# Patient Record
Sex: Male | Born: 1945 | Race: White | Hispanic: No | Marital: Married | Smoking: Former smoker
Health system: Southern US, Community
[De-identification: ages and names within clinical notes are randomized; demographics above are authoritative.]

## PROBLEM LIST (undated history)

## (undated) DIAGNOSIS — J449 Chronic obstructive pulmonary disease, unspecified: Secondary | ICD-10-CM

## (undated) DIAGNOSIS — D649 Anemia, unspecified: Secondary | ICD-10-CM

## (undated) DIAGNOSIS — C801 Malignant (primary) neoplasm, unspecified: Secondary | ICD-10-CM

## (undated) HISTORY — PX: TONSILLECTOMY: SUR1361

---

## 1989-10-02 HISTORY — PX: KNEE ARTHROSCOPY: SUR90

## 2017-03-09 ENCOUNTER — Emergency Department: Payer: PPO

## 2017-03-09 ENCOUNTER — Inpatient Hospital Stay
Admission: EM | Admit: 2017-03-09 | Discharge: 2017-03-11 | DRG: 375 | Disposition: A | Discharge status: Home / Self Care | Payer: PPO | Attending: Internal Medicine | Admitting: Internal Medicine

## 2017-03-09 ENCOUNTER — Encounter: Payer: Self-pay | Admitting: Emergency Medicine

## 2017-03-09 DIAGNOSIS — Z7982 Long term (current) use of aspirin: Secondary | ICD-10-CM | POA: Diagnosis not present

## 2017-03-09 DIAGNOSIS — R030 Elevated blood-pressure reading, without diagnosis of hypertension: Secondary | ICD-10-CM | POA: Diagnosis present

## 2017-03-09 DIAGNOSIS — Z87891 Personal history of nicotine dependence: Secondary | ICD-10-CM | POA: Diagnosis not present

## 2017-03-09 DIAGNOSIS — R1900 Intra-abdominal and pelvic swelling, mass and lump, unspecified site: Secondary | ICD-10-CM | POA: Diagnosis not present

## 2017-03-09 DIAGNOSIS — M129 Arthropathy, unspecified: Secondary | ICD-10-CM | POA: Diagnosis not present

## 2017-03-09 DIAGNOSIS — E871 Hypo-osmolality and hyponatremia: Secondary | ICD-10-CM | POA: Diagnosis not present

## 2017-03-09 DIAGNOSIS — R109 Unspecified abdominal pain: Secondary | ICD-10-CM | POA: Diagnosis not present

## 2017-03-09 DIAGNOSIS — R1013 Epigastric pain: Secondary | ICD-10-CM | POA: Diagnosis present

## 2017-03-09 DIAGNOSIS — R7989 Other specified abnormal findings of blood chemistry: Secondary | ICD-10-CM | POA: Diagnosis present

## 2017-03-09 DIAGNOSIS — C168 Malignant neoplasm of overlapping sites of stomach: Secondary | ICD-10-CM | POA: Diagnosis not present

## 2017-03-09 DIAGNOSIS — J449 Chronic obstructive pulmonary disease, unspecified: Secondary | ICD-10-CM

## 2017-03-09 DIAGNOSIS — D62 Acute posthemorrhagic anemia: Secondary | ICD-10-CM | POA: Diagnosis not present

## 2017-03-09 DIAGNOSIS — G893 Neoplasm related pain (acute) (chronic): Secondary | ICD-10-CM | POA: Diagnosis not present

## 2017-03-09 DIAGNOSIS — C787 Secondary malignant neoplasm of liver and intrahepatic bile duct: Secondary | ICD-10-CM | POA: Diagnosis not present

## 2017-03-09 DIAGNOSIS — R531 Weakness: Secondary | ICD-10-CM

## 2017-03-09 DIAGNOSIS — K3189 Other diseases of stomach and duodenum: Secondary | ICD-10-CM | POA: Diagnosis present

## 2017-03-09 DIAGNOSIS — C160 Malignant neoplasm of cardia: Principal | ICD-10-CM | POA: Diagnosis present

## 2017-03-09 DIAGNOSIS — R110 Nausea: Secondary | ICD-10-CM | POA: Diagnosis not present

## 2017-03-09 DIAGNOSIS — R9431 Abnormal electrocardiogram [ECG] [EKG]: Secondary | ICD-10-CM | POA: Diagnosis not present

## 2017-03-09 DIAGNOSIS — D500 Iron deficiency anemia secondary to blood loss (chronic): Secondary | ICD-10-CM

## 2017-03-09 DIAGNOSIS — M899 Disorder of bone, unspecified: Secondary | ICD-10-CM | POA: Diagnosis not present

## 2017-03-09 DIAGNOSIS — C799 Secondary malignant neoplasm of unspecified site: Secondary | ICD-10-CM

## 2017-03-09 DIAGNOSIS — Z803 Family history of malignant neoplasm of breast: Secondary | ICD-10-CM | POA: Diagnosis not present

## 2017-03-09 DIAGNOSIS — K769 Liver disease, unspecified: Secondary | ICD-10-CM

## 2017-03-09 DIAGNOSIS — C169 Malignant neoplasm of stomach, unspecified: Secondary | ICD-10-CM

## 2017-03-09 DIAGNOSIS — M1990 Unspecified osteoarthritis, unspecified site: Secondary | ICD-10-CM | POA: Diagnosis present

## 2017-03-09 DIAGNOSIS — D600 Chronic acquired pure red cell aplasia: Secondary | ICD-10-CM | POA: Diagnosis not present

## 2017-03-09 DIAGNOSIS — R5383 Other fatigue: Secondary | ICD-10-CM

## 2017-03-09 DIAGNOSIS — R10816 Epigastric abdominal tenderness: Secondary | ICD-10-CM | POA: Diagnosis present

## 2017-03-09 DIAGNOSIS — R634 Abnormal weight loss: Secondary | ICD-10-CM | POA: Diagnosis not present

## 2017-03-09 DIAGNOSIS — R627 Adult failure to thrive: Secondary | ICD-10-CM | POA: Diagnosis not present

## 2017-03-09 DIAGNOSIS — K921 Melena: Secondary | ICD-10-CM | POA: Diagnosis not present

## 2017-03-09 HISTORY — DX: Chronic obstructive pulmonary disease, unspecified: J44.9

## 2017-03-09 LAB — IRON AND TIBC
IRON: 10 ug/dL — AB (ref 45–182)
SATURATION RATIOS: 3 % — AB (ref 17.9–39.5)
TIBC: 297 ug/dL (ref 250–450)
UIBC: 287 ug/dL

## 2017-03-09 LAB — COMPREHENSIVE METABOLIC PANEL
ALBUMIN: 3.8 g/dL (ref 3.5–5.0)
ALT: 16 U/L — ABNORMAL LOW (ref 17–63)
ANION GAP: 9 (ref 5–15)
AST: 22 U/L (ref 15–41)
Alkaline Phosphatase: 98 U/L (ref 38–126)
BILIRUBIN TOTAL: 0.6 mg/dL (ref 0.3–1.2)
BUN: 16 mg/dL (ref 6–20)
CHLORIDE: 95 mmol/L — AB (ref 101–111)
CO2: 27 mmol/L (ref 22–32)
Calcium: 9.2 mg/dL (ref 8.9–10.3)
Creatinine, Ser: 0.57 mg/dL — ABNORMAL LOW (ref 0.61–1.24)
GFR calc Af Amer: >60 mL/min (ref >60)
GFR calc non Af Amer: >60 mL/min (ref >60)
GLUCOSE: 105 mg/dL — AB (ref 65–99)
POTASSIUM: 4 mmol/L (ref 3.5–5.1)
SODIUM: 131 mmol/L — AB (ref 135–145)
TOTAL PROTEIN: 7.3 g/dL (ref 6.5–8.1)

## 2017-03-09 LAB — CBC
HCT: 28 % — ABNORMAL LOW (ref 40.0–52.0)
Hemoglobin: 8.9 g/dL — ABNORMAL LOW (ref 13.0–18.0)
MCH: 23.5 pg — ABNORMAL LOW (ref 26.0–34.0)
MCHC: 31.9 g/dL — AB (ref 32.0–36.0)
MCV: 73.7 fL — ABNORMAL LOW (ref 80.0–100.0)
PLATELETS: 654 10*3/uL — AB (ref 150–440)
RBC: 3.8 MIL/uL — ABNORMAL LOW (ref 4.40–5.90)
RDW: 24.2 % — AB (ref 11.5–14.5)
WBC: 12.1 10*3/uL — ABNORMAL HIGH (ref 3.8–10.6)

## 2017-03-09 LAB — LIPASE, BLOOD: Lipase: 26 U/L (ref 11–51)

## 2017-03-09 LAB — PROTIME-INR
INR: 1.08
Prothrombin Time: 14 seconds (ref 11.4–15.2)

## 2017-03-09 LAB — FERRITIN: FERRITIN: 60 ng/mL (ref 24–336)

## 2017-03-09 LAB — LACTATE DEHYDROGENASE: LDH: 213 U/L — AB (ref 98–192)

## 2017-03-09 LAB — TROPONIN I: Troponin I: <0.03 ng/mL (ref <0.03)

## 2017-03-09 LAB — FOLATE: Folate: 17 ng/mL (ref >5.9)

## 2017-03-09 LAB — VITAMIN B12: Vitamin B-12: 327 pg/mL (ref 180–914)

## 2017-03-09 MED ORDER — PANTOPRAZOLE SODIUM 40 MG PO TBEC
40.0000 mg | DELAYED_RELEASE_TABLET | Freq: Every day | ORAL | Status: DC
  Administered 2017-03-09: 16:00:00 40 mg via ORAL
  Filled 2017-03-09: qty 1

## 2017-03-09 MED ORDER — METOPROLOL TARTRATE 25 MG PO TABS
12.5000 mg | ORAL_TABLET | Freq: Two times a day (BID) | ORAL | Status: DC
  Administered 2017-03-09 – 2017-03-10 (×3): 12.5 mg via ORAL
  Filled 2017-03-09 (×3): qty 1

## 2017-03-09 MED ORDER — IOPAMIDOL (ISOVUE-300) INJECTION 61%
100.0000 mL | Freq: Once | INTRAVENOUS | Status: AC | PRN
  Administered 2017-03-09: 100 mL via INTRAVENOUS

## 2017-03-09 MED ORDER — TRAMADOL HCL 50 MG PO TABS
50.0000 mg | ORAL_TABLET | Freq: Four times a day (QID) | ORAL | Status: DC | PRN
  Administered 2017-03-09: 50 mg via ORAL
  Filled 2017-03-09: qty 1

## 2017-03-09 MED ORDER — IRON SUCROSE 20 MG/ML IV SOLN
100.0000 mg | Freq: Once | INTRAVENOUS | Status: AC
  Administered 2017-03-10: 100 mg via INTRAVENOUS
  Filled 2017-03-09: qty 5

## 2017-03-09 MED ORDER — SODIUM CHLORIDE 0.9 % IV SOLN
INTRAVENOUS | Status: AC
  Administered 2017-03-09: 16:00:00 via INTRAVENOUS

## 2017-03-09 MED ORDER — FENTANYL CITRATE (PF) 100 MCG/2ML IJ SOLN
25.0000 ug | Freq: Once | INTRAMUSCULAR | Status: AC
  Administered 2017-03-09: 25 ug via INTRAVENOUS
  Filled 2017-03-09: qty 2

## 2017-03-09 MED ORDER — FENTANYL CITRATE (PF) 100 MCG/2ML IJ SOLN: 12.5000 ug | INTRAMUSCULAR | Status: DC | PRN

## 2017-03-09 MED ORDER — ONDANSETRON HCL 4 MG PO TABS
4.0000 mg | ORAL_TABLET | Freq: Four times a day (QID) | ORAL | Status: DC | PRN
  Administered 2017-03-10 – 2017-03-11 (×2): 4 mg via ORAL
  Filled 2017-03-09 (×2): qty 1

## 2017-03-09 MED ORDER — ONDANSETRON HCL 4 MG/2ML IJ SOLN
4.0000 mg | Freq: Four times a day (QID) | INTRAMUSCULAR | Status: DC | PRN
  Administered 2017-03-09 – 2017-03-10 (×2): 4 mg via INTRAVENOUS
  Filled 2017-03-09 (×2): qty 2

## 2017-03-09 MED ORDER — ONDANSETRON HCL 4 MG/2ML IJ SOLN
4.0000 mg | Freq: Once | INTRAMUSCULAR | Status: AC
  Administered 2017-03-09: 4 mg via INTRAVENOUS
  Filled 2017-03-09: qty 2

## 2017-03-09 MED ORDER — ORAL CARE MOUTH RINSE
15.0000 mL | Freq: Two times a day (BID) | OROMUCOSAL | Status: DC
  Administered 2017-03-09 – 2017-03-11 (×4): 15 mL via OROMUCOSAL

## 2017-03-09 MED ORDER — ENOXAPARIN SODIUM 40 MG/0.4ML ~~LOC~~ SOLN: 40.0000 mg | SUBCUTANEOUS | Status: DC

## 2017-03-09 MED ORDER — IOPAMIDOL (ISOVUE-300) INJECTION 61%
30.0000 mL | Freq: Once | INTRAVENOUS | Status: AC | PRN
  Administered 2017-03-09: 30 mL via ORAL

## 2017-03-09 MED ORDER — ENOXAPARIN SODIUM 40 MG/0.4ML ~~LOC~~ SOLN
40.0000 mg | SUBCUTANEOUS | Status: DC
  Filled 2017-03-09: qty 0.4

## 2017-03-09 NOTE — Progress Notes (Signed)
Family Meeting Note  Advance Directive:yes  Today a meeting took place with the Patient, SPOUSE   The following clinical team members were present during this meeting:MD  The following were discussed:Patient's diagnosis: Management and plan of care discussed in detail with the patient and spouse at bedside, Patient's progosis: Unable to determine and Goals for treatment: Full Code, WIFE is HCPOA, not considering palliative care at this time  Additional follow-up to be provided: Hospitalist and oncology  Time spent during discussion:16 min  Larry Wood, Illene Silver, MD

## 2017-03-09 NOTE — ED Triage Notes (Signed)
C/O abdominal pain x 10 days.  Denies n/v

## 2017-03-09 NOTE — H&P (Signed)
[place] [facility] [place] NAME: Larry Wood    MR#:  [id number]  DATE OF BIRTH:  1945/12/10  DATE OF ADMISSION:  03/09/2017  PRIMARY CARE PHYSICIAN: Patient, No Pcp Per   REQUESTING/REFERRING PHYSICIAN: [facility]  CHIEF COMPLAINT:  Epigastric abdominal pain and weight loss  HISTORY OF PRESENT ILLNESS:  Larry Wood  is a 71 y.o. male with a known history of COPD, remote history of smoking and alcoholism is presenting to the ED with a chief complaint of epigastric abdominal pain and weight loss of approximately 10 pounds in the past one month. Patient is also reporting decreased appetite. Sodium is at 131 and CT abdomen has revealed widespread hepatic metastatic disease. Gastric cardia mass and gastrohepatic ligament adenopathy implying gastric primary  PAST MEDICAL HISTORY:   Past Medical History:  Diagnosis Date  . COPD (chronic obstructive pulmonary disease) ([place])     PAST SURGICAL HISTOIRY:  History reviewed. No pertinent surgical history.  SOCIAL HISTORY:   Social History  Substance Use Topics  . Smoking status: Former Research scientist (life sciences)  . Smokeless tobacco: Never Used  . Alcohol use Not on file    FAMILY HISTORY:   Family history reviewed, no significant family history DRUG ALLERGIES:  No Known Allergies  REVIEW OF SYSTEMS:  CONSTITUTIONAL: No fever, fatigue or weakness.  EYES: No blurred or double vision.  EARS, NOSE, AND THROAT: No tinnitus or ear pain.  RESPIRATORY: No cough, shortness of breath, wheezing or hemoptysis.  CARDIOVASCULAR: No chest pain, orthopnea, edema.  GASTROINTESTINAL: Reporting  intractable epigastric abdominal pain and weight loss  GENITOURINARY: No dysuria, hematuria.  ENDOCRINE: No polyuria, nocturia,  HEMATOLOGY: No anemia, easy bruising or bleeding SKIN: No rash or lesion. MUSCULOSKELETAL: No joint pain or arthritis.   NEUROLOGIC: No tingling, numbness, weakness.  PSYCHIATRY: No anxiety or  depression.   MEDICATIONS AT HOME:   Prior to Admission medications   Medication Sig Start Date End Date Taking? Authorizing Provider  aspirin EC 81 MG tablet Take 81 mg by mouth daily.   Yes [provider]      VITAL SIGNS:  Blood pressure (!) 160/81, pulse 88, temperature 97.8 F (36.6 C), temperature source Oral, resp. rate (!) 21, height 5\' 10"  (1.778 m), weight 54.4 kg (120 lb), SpO2 99 %.  PHYSICAL EXAMINATION:  GENERAL:  71 y.o.-year-old patient lying in the bed with no acute distress. Cachectic EYES: Pupils equal, round, reactive to light and accommodation. No scleral icterus. Extraocular muscles intact.  HEENT: Head atraumatic, normocephalic. Oropharynx and nasopharynx clear.  NECK:  Supple, no jugular venous distention. No thyroid enlargement, no tenderness.  LUNGS: Normal breath sounds bilaterally, no wheezing, rales,rhonchi or crepitation. No use of accessory muscles of respiration.  CARDIOVASCULAR: S1, S2 normal. No murmurs, rubs, or gallops.  ABDOMEN: Soft, epigastric abdominal tenderness but no rebound tenderness nondistended. Bowel sounds present.   EXTREMITIES: No pedal edema, cyanosis, or clubbing.  NEUROLOGIC: Cranial nerves II through XII are intact. Muscle strength 5/5 in all extremities. Sensation intact. Gait not checked.  PSYCHIATRIC: The patient is alert and oriented x 3.  SKIN: No obvious rash, lesion, or ulcer.   LABORATORY PANEL:   CBC  Recent Labs Lab 03/09/17 1056  WBC 12.1*  HGB 8.9*  HCT 28.0*  PLT 654*   ------------------------------------------------------------------------------------------------------------------  Chemistries   Recent Labs Lab 03/09/17 1056  NA 131*  K 4.0  CL 95*  CO2 27  GLUCOSE 105*  BUN 16  CREATININE 0.57*  CALCIUM 9.2  AST 22  ALT 16*  ALKPHOS 98  BILITOT 0.6   ------------------------------------------------------------------------------------------------------------------  Cardiac  Enzymes  Recent Labs Lab 03/09/17 1056  TROPONINI <0.03   ------------------------------------------------------------------------------------------------------------------  RADIOLOGY:  Dg Chest 2 View  Result Date: 03/09/2017 CLINICAL DATA:  Nausea and epigastric pain over the last 10 days. EXAM: CHEST  2 VIEW COMPARISON:  None. FINDINGS: Heart size is normal. There is aortic atherosclerosis. Lungs are hyperinflated with likely emphysema. The lungs do not show any focal infiltrate, mass or volume loss. Snap artifacts overlie the chest. No effusions. No significant bone finding. IMPRESSION: No active disease.  Aortic atherosclerosis.  Probable emphysema. Electronically Signed   By: Nelson Chimes M.D.   On: 03/09/2017 11:27   Ct Abdomen Pelvis W Contrast  Result Date: 03/09/2017 CLINICAL DATA:  Epigastric pain with significant weight loss. EXAM: CT ABDOMEN AND PELVIS WITH CONTRAST TECHNIQUE: Multidetector CT imaging of the abdomen and pelvis was performed using the standard protocol following bolus administration of intravenous contrast. CONTRAST:  165mL ISOVUE-300 IOPAMIDOL (ISOVUE-300) INJECTION 61% COMPARISON:  None. FINDINGS: Lower chest:  Calcified granuloma in the left lower lobe. Hepatobiliary: Numerous hepatic masses which appears solid in heterogeneously enhancing, largest measuring 5 cm in segment 6. No background cirrhotic features.No evidence of biliary obstruction or stone. Pancreas: Negative for mass or ductal obstruction. Spleen: Unremarkable. Adrenals/Urinary Tract: Negative adrenals. No hydronephrosis or stone. Small renal cysts. Unremarkable bladder. Stomach/Bowel: There is a mass at the gastric cardia projecting into the lumen, measuring at least 33 mm. Two enlarged nodes in the gastrohepatic ligament with appearance matching the liver lesions, 18 mm in maximal diameter. No bowel obstruction.  Extensive colonic diverticulosis. Vascular/Lymphatic: Severe atherosclerosis with  short-segment left common iliac artery occlusion. Gastrohepatic ligament adenopathy as above. Reproductive:No pathologic findings. Other: No ascites or pneumoperitoneum. Musculoskeletal: No acute abnormalities. 1 mm sclerotic focus in the L2 vertebral body. IMPRESSION: 1. Findings of widespread hepatic metastatic disease. Gastric cardia mass and gastrohepatic ligament adenopathy implying gastric primary. 2. Attention on follow up to 1 cm sclerotic focus in L2 body. 3. Advanced atherosclerosis. Short-segment left common iliac occlusion. Electronically Signed   By: Monte Fantasia M.D.   On: 03/09/2017 12:45    EKG:   Orders placed or performed during the hospital encounter of 03/09/17  . ED EKG  . ED EKG  . EKG 12-Lead  . EKG 12-Lead    IMPRESSION AND PLAN:  Pt is presenting with a chief complaint of epigastric abdominal pain and weight loss of approximately 10 pounds in the past one month. Patient is also reporting decreased appetite. Sodium is at 131 and CT abdomen has revealed widespread hepatic metastatic disease. Gastric cardia mass and gastrohepatic ligament adenopathy implying gastric primary   # Hyponatremia secondary to poor by mouth intake Admit to MedSurg unit Hydrated with IV fluids Monitor BMPs Check fasting lipid panel and TSH  #Acute epigastric abdominal pain secondary to new diagnosis of gastric cancer with liver metastases Pain management as needed Oncology consulted, Dr. Janese Banks, holding patient's aspirin for possible biopsies PPI  #Failure to thrive secondary to a primary gastric cancer Consult dietitian for calorie count and that his supplements  #Elevated blood pressure without diagnosis of hypertension could be from anxiety and stress Will monitor blood pressure closely Start patient on low-dose metoprolol, titrate to discontinue as needed  Provide GI and DVT prophylaxis  All the records are reviewed and case discussed with ED provider. Management plans discussed  with the patient, family and  they are in agreement.  CODE STATUS: fc,Wife is the healthcare power of attorney  TOTAL TIME TAKING CARE OF THIS PATIENT: 45 minutes.   Note: This dictation was prepared with Dragon dictation along with smaller phrase technology. Any transcriptional errors that result from this process are unintentional.  Nicholes Mango M.D on 03/09/2017 at 2:18 PM  Between 7am to 6pm - Pager - [phone]  After 6pm go to [email or web address] - password [email or web address] [facility]  Office  [phone]  CC: Primary care physician; Patient, No Pcp Per

## 2017-03-09 NOTE — ED Provider Notes (Signed)
[facility] Emergency Department Provider Note   ____________________________________________    I have reviewed the triage vital signs and the nursing notes.   HISTORY  Chief Complaint Abdominal Pain     HPI Larry Wood is a 71 y.o. male who presents with complaints of abdominal pain. Patient reports epigastric abdominal pain which is been continuous over the last 10 days. He reports the pain is severe and throbbing and worse with lying flat. He has never had this before. No history of abdominal surgeries. Nothing seems to make it better or worse. No fevers or chills. He does report some shortness of breath has a history of COPD. He denies chest pain. No back pain. Normal stools, no nausea or vomiting   Past Medical History:  Diagnosis Date  . COPD (chronic obstructive pulmonary disease) ([facility])     There are no active problems to display for this patient.   History reviewed. No pertinent surgical history.  Prior to Admission medications   Medication Sig Start Date End Date Taking? Authorizing Provider  aspirin EC 81 MG tablet Take 81 mg by mouth daily.   Yes [provider]     Allergies Patient has no known allergies.  No family history on file.  Social History Social History  Substance Use Topics  . Smoking status: Former Research scientist (life sciences)  . Smokeless tobacco: Never Used  . Alcohol use Not on file    Review of Systems  Constitutional:Positive weight loss Eyes: No visual changes.  ENT: No sore throat. Cardiovascular: Denies chest pain. Respiratory: As above Gastrointestinal:As above Genitourinary: Negative for dysuria. Musculoskeletal: Negative for back pain. Skin: Negative for rash. Neurological: Negative for headaches or weakness   ____________________________________________   PHYSICAL EXAM:  VITAL SIGNS: ED Triage Vitals  Enc Vitals Group     BP 03/09/17 1030 131/61     Pulse Rate 03/09/17 1030 88   Resp 03/09/17 1030 16     Temp 03/09/17 1030 97.8 F (36.6 C)     Temp Source 03/09/17 1030 Oral     SpO2 03/09/17 1030 100 %     Weight 03/09/17 1031 54.4 kg (120 lb)     Height 03/09/17 1031 1.778 m (5\' 10" )     Head Circumference --      Peak Flow --      Pain Score 03/09/17 1031 8     Pain Loc --      Pain Edu? --      Excl. in [place]? --    Constitutional: Alert and oriented.Ill-appearing Eyes: Conjunctivae are normal.  Head: Atraumatic. Nose: No congestion/rhinnorhea. Mouth/Throat: Mucous membranes are moist.   Cardiovascular: Normal rate, regular rhythm. Grossly normal heart sounds.  Good peripheral circulation. Respiratory: Normal respiratory effort.  No retractions. Lungs CTAB. Gastrointestinal: Tenderness to palpation of the epigastrium. No distention.  No CVA tenderness. Genitourinary: deferred Musculoskeletal: .  Warm and well perfused Neurologic:  Normal speech and language. No gross focal neurologic deficits are appreciated.  Skin:  Skin is warm, dry and intact. No rash noted. Psychiatric: Mood and affect are normal. Speech and behavior are normal.  ____________________________________________   LABS (all labs ordered are listed, but only abnormal results are displayed)  Labs Reviewed  CBC - Abnormal; Notable for the following:       Result Value   WBC 12.1 (*)    RBC 3.80 (*)    Hemoglobin 8.9 (*)    HCT 28.0 (*)    MCV 73.7 (*)  MCH 23.5 (*)    MCHC 31.9 (*)    RDW 24.2 (*)    Platelets 654 (*)    All other components within normal limits  COMPREHENSIVE METABOLIC PANEL - Abnormal; Notable for the following:    Sodium 131 (*)    Chloride 95 (*)    Glucose, Bld 105 (*)    Creatinine, Ser 0.57 (*)    ALT 16 (*)    All other components within normal limits  LIPASE, BLOOD  TROPONIN I   ____________________________________________  EKG  ED ECG REPORT I, Lavonia Drafts, the attending physician, personally viewed and interpreted this ECG.  Date:  03/09/2017  Rate: 87 Rhythm: normal sinus rhythm QRS Axis: normal Intervals: normal ST/T Wave abnormalities: Nonspecific changes Conduction Disturbances: PVCs   ____________________________________________  RADIOLOGY  CT demonstrates findings of widespread hepatic metastatic disease likely gastric primary ____________________________________________   PROCEDURES  Procedure(s) performed: No    Critical Care performed: No ____________________________________________   INITIAL IMPRESSION / ASSESSMENT AND PLAN / ED COURSE  Pertinent labs & imaging results that were available during my care of the patient were reviewed by me and considered in my medical decision making (see chart for details).  Patient presents with epigastric pain with significant weight loss. Does not appear consistent with biliary colic, concern for pancreatic or gastric origin, possibly CA. We will give IV fentanyl, IV Zofran obtain CT abdomen and pelvis and reevaluate   ----------------------------------------- 1:14 PM on 03/09/2017 -----------------------------------------  CT demonstrates likely metastatic disease with gastric source. I discussed this with the patient. I also spoke to Dr. Janese Banks of oncology who will see the patient. The patient continues to have pain, additional dose of IV fentanyl given    ____________________________________________   FINAL CLINICAL IMPRESSION(S) / ED DIAGNOSES  Final diagnoses:  Metastatic neoplastic disease ([place])      NEW MEDICATIONS STARTED DURING THIS VISIT:  New Prescriptions   No medications on file     Note:  This document was prepared using Dragon voice recognition software and may include unintentional dictation errors.    Lavonia Drafts, MD 03/09/17 1316

## 2017-03-09 NOTE — ED Notes (Signed)
Pt returned from CT °

## 2017-03-09 NOTE — Plan of Care (Signed)
Problem: Pain Managment: Goal: General experience of comfort will improve Outcome: Progressing Pt admitted from the ED. VSS. Tramadol given for abd pain once with improvement. Denies n/v.

## 2017-03-09 NOTE — Consult Note (Signed)
Hematology/Oncology Consult note [facility] Telephone:([phone] [phone]  Patient Care Team: Patient, No Pcp Per as PCP - General (General Practice)   Name of the patient: Larry Wood  [id number]  1945/10/12    Reason for consult: gastric mass/ liver mets   Requesting physician: Dr. Corky Downs  Date of visit: 03/09/2017    History of presenting illness- patient is a 71 year old Caucasian male who presented to the ER today with symptoms of worsening abdominal pain. Pain started about 1 week ago and is in the epigastrium. Prior to 1 week he reports no pain. He has been havving dark tarry stools for about 3 weeks now.  He is also had a significant weight loss of about 10 pounds over the last few months. CT abdomen done in the ER showed multiple liver metastases as well as a mass in the gastric cardia projecting into the lumen as well as adenopathy in the gastrohepatic ligament   At baseline he has arthritis but no other medical issues. He lives with his wife in [place]. His daughter is deceased and son lives close to [place]. Sister probably had breast cancer. He is not sure.   ECOG PS- 2  Pain scale- 4   Review of systems- Review of Systems  Constitutional: Positive for malaise/fatigue and weight loss. Negative for chills and fever.  HENT: Negative for congestion, ear discharge and nosebleeds.   Eyes: Negative for blurred vision.  Respiratory: Negative for cough, hemoptysis, sputum production, shortness of breath and wheezing.   Cardiovascular: Negative for chest pain, palpitations, orthopnea and claudication.  Gastrointestinal: Positive for abdominal pain and melena. Negative for blood in stool, constipation, diarrhea, heartburn, nausea and vomiting.  Genitourinary: Negative for dysuria, flank pain, frequency, hematuria and urgency.  Musculoskeletal: Negative for back pain, joint pain and myalgias.  Skin: Negative for rash.  Neurological:  Positive for weakness. Negative for dizziness, tingling, focal weakness, seizures and headaches.  Endo/Heme/Allergies: Does not bruise/bleed easily.  Psychiatric/Behavioral: Negative for depression and suicidal ideas. The patient does not have insomnia.     No Known Allergies  There are no active problems to display for this patient.    Past Medical History:  Diagnosis Date  . COPD (chronic obstructive pulmonary disease) ([place])      History reviewed. No pertinent surgical history.  Social History   Social History  . Marital status: Married    Spouse name: N/A  . Number of children: N/A  . Years of education: N/A   Occupational History  . Not on file.   Social History Main Topics  . Smoking status: Former Research scientist (life sciences)  . Smokeless tobacco: Never Used  . Alcohol use Not on file  . Drug use: Unknown  . Sexual activity: Not on file   Other Topics Concern  . Not on file   Social History Narrative  . No narrative on file     No family history on file.  No current facility-administered medications for this encounter.  No current outpatient prescriptions on file.   Physical exam:  Vitals:   03/09/17 1030 03/09/17 1031  BP: 131/61   Pulse: 88   Resp: 16   Temp: 97.8 F (36.6 C)   TempSrc: Oral   SpO2: 100%   Weight:  120 lb (54.4 kg)  Height:  5\' 10"  (1.778 m)   Physical Exam  Constitutional: He is oriented to person, place, and time.  Thin and cachectic. Fatigued and ill appearing  HENT:  Head: Normocephalic and  atraumatic.  Eyes: EOM are normal. Pupils are equal, round, and reactive to light.  Neck: Normal range of motion.  Cardiovascular: Normal rate, regular rhythm and normal heart sounds.   Pulmonary/Chest: Effort normal and breath sounds normal.  Abdominal: Soft. Bowel sounds are normal.  TTP in the epigastrium  Neurological: He is alert and oriented to person, place, and time.  Skin: Skin is warm and dry.       CMP Latest Ref Rng & Units 03/09/2017   Glucose 65 - 99 mg/dL 105(H)  BUN 6 - 20 mg/dL 16  Creatinine 0.61 - 1.24 mg/dL 0.57(L)  Sodium 135 - 145 mmol/L 131(L)  Potassium 3.5 - 5.1 mmol/L 4.0  Chloride 101 - 111 mmol/L 95(L)  CO2 22 - 32 mmol/L 27  Calcium 8.9 - 10.3 mg/dL 9.2  Total Protein 6.5 - 8.1 g/dL 7.3  Total Bilirubin 0.3 - 1.2 mg/dL 0.6  Alkaline Phos 38 - 126 U/L 98  AST 15 - 41 U/L 22  ALT 17 - 63 U/L 16(L)   CBC Latest Ref Rng & Units 03/09/2017  WBC 3.8 - 10.6 K/uL 12.1(H)  Hemoglobin 13.0 - 18.0 g/dL 8.9(L)  Hematocrit 40.0 - 52.0 % 28.0(L)  Platelets 150 - 440 K/uL 654(H)    @IMAGES @  Dg Chest 2 View  Result Date: 03/09/2017 CLINICAL DATA:  Nausea and epigastric pain over the last 10 days. EXAM: CHEST  2 VIEW COMPARISON:  None. FINDINGS: Heart size is normal. There is aortic atherosclerosis. Lungs are hyperinflated with likely emphysema. The lungs do not show any focal infiltrate, mass or volume loss. Snap artifacts overlie the chest. No effusions. No significant bone finding. IMPRESSION: No active disease.  Aortic atherosclerosis.  Probable emphysema. Electronically Signed   By: Nelson Chimes M.D.   On: 03/09/2017 11:27   Ct Abdomen Pelvis W Contrast  Result Date: 03/09/2017 CLINICAL DATA:  Epigastric pain with significant weight loss. EXAM: CT ABDOMEN AND PELVIS WITH CONTRAST TECHNIQUE: Multidetector CT imaging of the abdomen and pelvis was performed using the standard protocol following bolus administration of intravenous contrast. CONTRAST:  14mL ISOVUE-300 IOPAMIDOL (ISOVUE-300) INJECTION 61% COMPARISON:  None. FINDINGS: Lower chest:  Calcified granuloma in the left lower lobe. Hepatobiliary: Numerous hepatic masses which appears solid in heterogeneously enhancing, largest measuring 5 cm in segment 6. No background cirrhotic features.No evidence of biliary obstruction or stone. Pancreas: Negative for mass or ductal obstruction. Spleen: Unremarkable. Adrenals/Urinary Tract: Negative adrenals. No  hydronephrosis or stone. Small renal cysts. Unremarkable bladder. Stomach/Bowel: There is a mass at the gastric cardia projecting into the lumen, measuring at least 33 mm. Two enlarged nodes in the gastrohepatic ligament with appearance matching the liver lesions, 18 mm in maximal diameter. No bowel obstruction.  Extensive colonic diverticulosis. Vascular/Lymphatic: Severe atherosclerosis with short-segment left common iliac artery occlusion. Gastrohepatic ligament adenopathy as above. Reproductive:No pathologic findings. Other: No ascites or pneumoperitoneum. Musculoskeletal: No acute abnormalities. 1 mm sclerotic focus in the L2 vertebral body. IMPRESSION: 1. Findings of widespread hepatic metastatic disease. Gastric cardia mass and gastrohepatic ligament adenopathy implying gastric primary. 2. Attention on follow up to 1 cm sclerotic focus in L2 body. 3. Advanced atherosclerosis. Short-segment left common iliac occlusion. Electronically Signed   By: Monte Fantasia M.D.   On: 03/09/2017 12:45    Assessment and plan- Patient is a 71 y.o. male presenting with abdominal pain found to have liver mets and gastric mass as well as gastrohepatic adenopathy concerning for metastatic gastric cancer  1. Please obtain  CT chest with oral and IV contrast and bone scan to complete staging work up. Please keep him NPO after midnight today for possible EGD by GI tomorrow so he can get tissue diagnosis asap. Please start him on protonix given his melena.   2. He does have microcytic anemia on labs likely due to iron deficiency in the setting of bleeding from gastric cancer. I would  recommend checking iron studies and give him 1 dose of IV iron while inpatient. Leucocytosis and thrombocytosis likely reactive to malignancy and iron deficiency  3. Neoplasm related pain- recommend starting oxycodone 5 mg PO Q4 prn for pain. Short course of steroids would also be helpful given widespread liver mets if narcotics dont control  his pain.  I will see him in my clinic 1 week post discharge and discuss treatment options. I explained the results of CT scan to the patient, likely diagnosis of gastric cancer and that chemotherapy would be palliative and nto curative. He would like to think about his options before deciding to opt for treatment.   Thank you for this kind referral and the opportunity to participate in the care of this patient   Visit Diagnosis Gastric mass/ liver metastases  Dr. Randa Evens, MD, MPH [facility] Pager- [phone] 03/09/2017  4:24 PM

## 2017-03-10 ENCOUNTER — Inpatient Hospital Stay: Payer: PPO | Admitting: Anesthesiology

## 2017-03-10 ENCOUNTER — Encounter: Payer: Self-pay | Admitting: Radiology

## 2017-03-10 ENCOUNTER — Encounter
Admission: EM | Disposition: A | Discharge status: Home / Self Care | Payer: Self-pay | Source: Home / Self Care | Attending: Internal Medicine

## 2017-03-10 ENCOUNTER — Inpatient Hospital Stay: Payer: PPO

## 2017-03-10 ENCOUNTER — Other Ambulatory Visit: Payer: PPO

## 2017-03-10 DIAGNOSIS — R10816 Epigastric abdominal tenderness: Secondary | ICD-10-CM | POA: Diagnosis present

## 2017-03-10 DIAGNOSIS — K3189 Other diseases of stomach and duodenum: Secondary | ICD-10-CM | POA: Diagnosis present

## 2017-03-10 HISTORY — PX: ESOPHAGOGASTRODUODENOSCOPY (EGD) WITH PROPOFOL: SHX5813

## 2017-03-10 LAB — COMPREHENSIVE METABOLIC PANEL
ALT: 13 U/L — AB (ref 17–63)
AST: 15 U/L (ref 15–41)
Albumin: 3.1 g/dL — ABNORMAL LOW (ref 3.5–5.0)
Alkaline Phosphatase: 80 U/L (ref 38–126)
Anion gap: 5 (ref 5–15)
BILIRUBIN TOTAL: 0.7 mg/dL (ref 0.3–1.2)
BUN: 13 mg/dL (ref 6–20)
CHLORIDE: 99 mmol/L — AB (ref 101–111)
CO2: 29 mmol/L (ref 22–32)
CREATININE: 0.62 mg/dL (ref 0.61–1.24)
Calcium: 8.7 mg/dL — ABNORMAL LOW (ref 8.9–10.3)
GFR calc non Af Amer: >60 mL/min (ref >60)
Glucose, Bld: 95 mg/dL (ref 65–99)
Potassium: 4.6 mmol/L (ref 3.5–5.1)
Sodium: 133 mmol/L — ABNORMAL LOW (ref 135–145)
TOTAL PROTEIN: 6 g/dL — AB (ref 6.5–8.1)

## 2017-03-10 LAB — CBC
HCT: 24.6 % — ABNORMAL LOW (ref 40.0–52.0)
Hemoglobin: 8.1 g/dL — ABNORMAL LOW (ref 13.0–18.0)
MCH: 24.4 pg — ABNORMAL LOW (ref 26.0–34.0)
MCHC: 32.8 g/dL (ref 32.0–36.0)
MCV: 74.3 fL — AB (ref 80.0–100.0)
PLATELETS: 559 10*3/uL — AB (ref 150–440)
RBC: 3.31 MIL/uL — AB (ref 4.40–5.90)
RDW: 23.8 % — ABNORMAL HIGH (ref 11.5–14.5)
WBC: 8.7 10*3/uL (ref 3.8–10.6)

## 2017-03-10 LAB — OCCULT BLOOD X 1 CARD TO LAB, STOOL
Fecal Occult Bld: NEGATIVE
Fecal Occult Bld: POSITIVE — AB

## 2017-03-10 LAB — TSH: TSH: 2.157 u[IU]/mL (ref 0.350–4.500)

## 2017-03-10 LAB — HAPTOGLOBIN: HAPTOGLOBIN: 255 mg/dL — AB (ref 34–200)

## 2017-03-10 SURGERY — ESOPHAGOGASTRODUODENOSCOPY (EGD) WITH PROPOFOL
Anesthesia: General

## 2017-03-10 MED ORDER — SODIUM CHLORIDE 0.9 % IV SOLN
INTRAVENOUS | Status: DC
  Administered 2017-03-10: 09:00:00 via INTRAVENOUS

## 2017-03-10 MED ORDER — OXYCODONE HCL 5 MG PO TABS
5.0000 mg | ORAL_TABLET | ORAL | Status: DC | PRN
  Administered 2017-03-10 – 2017-03-11 (×2): 5 mg via ORAL
  Filled 2017-03-10 (×2): qty 1

## 2017-03-10 MED ORDER — LIDOCAINE HCL (CARDIAC) 20 MG/ML IV SOLN
INTRAVENOUS | Status: DC | PRN
  Administered 2017-03-10: 40 mg via INTRAVENOUS

## 2017-03-10 MED ORDER — LIDOCAINE HCL (PF) 2 % IJ SOLN
INTRAMUSCULAR | Status: AC
  Filled 2017-03-10: qty 2

## 2017-03-10 MED ORDER — PANTOPRAZOLE SODIUM 40 MG IV SOLR: 40.0000 mg | Freq: Two times a day (BID) | INTRAVENOUS | Status: DC

## 2017-03-10 MED ORDER — IOPAMIDOL (ISOVUE-300) INJECTION 61%
75.0000 mL | Freq: Once | INTRAVENOUS | Status: AC | PRN
  Administered 2017-03-10: 16:00:00 75 mL via INTRAVENOUS

## 2017-03-10 MED ORDER — SODIUM CHLORIDE 0.9 % IV SOLN
INTRAVENOUS | Status: DC
  Administered 2017-03-10: 10:00:00 via INTRAVENOUS

## 2017-03-10 MED ORDER — PROPOFOL 10 MG/ML IV BOLUS
INTRAVENOUS | Status: DC | PRN
  Administered 2017-03-10: 60 mg via INTRAVENOUS

## 2017-03-10 MED ORDER — SODIUM CHLORIDE 0.9% FLUSH: 3.0000 mL | INTRAVENOUS | Status: DC | PRN

## 2017-03-10 MED ORDER — TECHNETIUM TC 99M MEDRONATE IV KIT
22.4000 | PACK | Freq: Once | INTRAVENOUS | Status: AC | PRN
  Administered 2017-03-10: 13:00:00 22.4 via INTRAVENOUS

## 2017-03-10 MED ORDER — MORPHINE SULFATE (PF) 2 MG/ML IV SOLN: 2.0000 mg | INTRAVENOUS | Status: DC | PRN

## 2017-03-10 MED ORDER — SODIUM CHLORIDE 0.9% FLUSH
3.0000 mL | Freq: Two times a day (BID) | INTRAVENOUS | Status: DC
  Administered 2017-03-10 (×2): 3 mL via INTRAVENOUS

## 2017-03-10 MED ORDER — PROPOFOL 500 MG/50ML IV EMUL
INTRAVENOUS | Status: DC | PRN
  Administered 2017-03-10: 150 ug/kg/min via INTRAVENOUS

## 2017-03-10 MED ORDER — SODIUM CHLORIDE 0.9 % IV SOLN
80.0000 mg | Freq: Once | INTRAVENOUS | Status: AC
  Administered 2017-03-10: 80 mg via INTRAVENOUS
  Filled 2017-03-10: qty 80

## 2017-03-10 MED ORDER — SODIUM CHLORIDE 0.9 % IV SOLN
8.0000 mg/h | INTRAVENOUS | Status: DC
  Administered 2017-03-10 – 2017-03-11 (×3): 8 mg/h via INTRAVENOUS
  Filled 2017-03-10 (×3): qty 80

## 2017-03-10 MED ORDER — PROPOFOL 10 MG/ML IV BOLUS
INTRAVENOUS | Status: AC
  Filled 2017-03-10: qty 20

## 2017-03-10 NOTE — Anesthesia Preprocedure Evaluation (Signed)
Anesthesia Evaluation  Patient identified by MRN, date of birth, ID band Patient awake    Reviewed: Allergy & Precautions, NPO status , Patient's Chart, lab work & pertinent test results  History of Anesthesia Complications Negative for: history of anesthetic complications  Airway Mallampati: II  TM Distance: >3 FB Neck ROM: Full    Dental  (+) Edentulous Upper, Edentulous Lower   Pulmonary COPD, former smoker,    breath sounds clear to auscultation- rhonchi (-) wheezing      Cardiovascular (-) hypertension(-) CAD and (-) Past MI  Rhythm:Regular Rate:Normal - Systolic murmurs and - Diastolic murmurs    Neuro/Psych negative neurological ROS  negative psych ROS   GI/Hepatic Neg liver ROS, Gastric mass with liver mets   Endo/Other  negative endocrine ROSneg diabetes  Renal/GU negative Renal ROS     Musculoskeletal negative musculoskeletal ROS (+)   Abdominal (+) - obese,   Peds  Hematology negative hematology ROS (+)   Anesthesia Other Findings    Reproductive/Obstetrics                             Anesthesia Physical Anesthesia Plan  ASA: II  Anesthesia Plan: General   Post-op Pain Management:    Induction: Intravenous  PONV Risk Score and Plan:   Airway Management Planned: Natural Airway  Additional Equipment:   Intra-op Plan:   Post-operative Plan:   Informed Consent: I have reviewed the patients History and Physical, chart, labs and discussed the procedure including the risks, benefits and alternatives for the proposed anesthesia with the patient or authorized representative who has indicated his/her understanding and acceptance.   Dental advisory given  Plan Discussed with: CRNA and Anesthesiologist  Anesthesia Plan Comments:         Anesthesia Quick Evaluation

## 2017-03-10 NOTE — Anesthesia Post-op Follow-up Note (Cosign Needed)
Anesthesia QCDR form completed.        

## 2017-03-10 NOTE — Progress Notes (Signed)
Pt kept NPO with metoprolol given with sip of water per Endo nurse, Dava. VSS. Informed consent obtained. VSS. Iron infusion given and tolerated well. Wife at bedside. IVFs at [facility] started.  Report midepigastric pain constant; worse with movement.

## 2017-03-10 NOTE — Progress Notes (Signed)
Pt reports he vomited x 1 with nausea relieved- brown liquid with mucus and orange jello.  Encouraged to sip and take small bites as tolerated.

## 2017-03-10 NOTE — Transfer of Care (Signed)
Immediate Anesthesia Transfer of Care Note  Patient: Larry Wood  Procedure(s) Performed: Procedure(s): ESOPHAGOGASTRODUODENOSCOPY (EGD) WITH PROPOFOL (N/A)  Patient Location: PACU and Endoscopy Unit  Anesthesia Type:General  Level of Consciousness: sedated  Airway & Oxygen Therapy: Patient Spontanous Breathing and Patient connected to nasal cannula oxygen  Post-op Assessment: Report given to RN and Post -op Vital signs reviewed and stable  Post vital signs: Reviewed and stable  Last Vitals:  Vitals:   03/10/17 0927 03/10/17 0953  BP: (!) 149/66 (!) 112/57  Pulse: 74   Resp: 18   Temp: 16.0 C     Complications: No apparent anesthesia complications

## 2017-03-10 NOTE — Consult Note (Signed)
Referring Provider: Dr. Leslye Peer Primary Care Physician:  Patient, No Pcp Per   Reason for Consultation:  Gastric mass; Epigastric pain  HPI: Larry Wood is a 71 y.o. male with a one week history of sharp epigastric pain and weight loss of 10 pounds in the past month. Denies dysphagia, N/V. Denies heartburn. He has a history of alcohol abuse and tobacco abuse. CT showed a mass in the gastric cardia with liver mets and gastric lymphadenopathy.  Past Medical History:  Diagnosis Date  . COPD (chronic obstructive pulmonary disease) ([place])     History reviewed. No pertinent surgical history.  Prior to Admission medications   Medication Sig Start Date End Date Taking? Authorizing Provider  aspirin EC 81 MG tablet Take 81 mg by mouth daily.   Yes [provider]    Scheduled Meds: . [MAR Hold] enoxaparin (LOVENOX) injection  40 mg Subcutaneous Q24H  . [MAR Hold] mouth rinse  15 mL Mouth Rinse BID  . [MAR Hold] metoprolol tartrate  12.5 mg Oral BID  . [MAR Hold] pantoprazole  40 mg Oral Daily  . [MAR Hold] sodium chloride flush  3 mL Intravenous Q12H   Continuous Infusions: . sodium chloride 20 mL/hr at 03/10/17 0844  . sodium chloride     PRN Meds:.[MAR Hold] fentaNYL (SUBLIMAZE) injection, [MAR Hold] ondansetron **OR** [MAR Hold] ondansetron (ZOFRAN) IV, [MAR Hold] sodium chloride flush, [MAR Hold] traMADol  Allergies as of 03/09/2017  . (No Known Allergies)    History reviewed. No pertinent family history.  Social History   Social History  . Marital status: Married    Spouse name: N/A  . Number of children: N/A  . Years of education: N/A   Occupational History  . Not on file.   Social History Main Topics  . Smoking status: Former Research scientist (life sciences)  . Smokeless tobacco: Never Used  . Alcohol use No  . Drug use: No  . Sexual activity: Not on file   Other Topics Concern  . Not on file   Social History Narrative  . No narrative on file    Review of Systems:  All negative except as stated above in HPI.  Physical Exam: Vital signs: Vitals:   03/10/17 0408 03/10/17 0740  BP: (!) 150/59 138/65  Pulse: 75 83  Resp: 20 20  Temp: 97.8 F (36.6 C) 98 F (36.7 C)   Last BM Date: 03/07/17 General:   Cachetic, elderly, lethargic, no acute distress HEENT: anicteric sclera, oropharynx clear Neck: thin, nontender Lungs:  Clear throughout to auscultation.   No wheezes, crackles, or rhonchi. No acute distress. Heart:  Regular rate and rhythm; no murmurs, clicks, rubs,  or gallops. Abdomen: upper quadrant tenderness with guarding, soft, nondistended, +BS  Rectal:  Deferred Ext: no edema  GI:  Lab Results:  Recent Labs  03/09/17 1056 03/10/17 0252  WBC 12.1* 8.7  HGB 8.9* 8.1*  HCT 28.0* 24.6*  PLT 654* 559*   BMET  Recent Labs  03/09/17 1056 03/10/17 0252  NA 131* 133*  K 4.0 4.6  CL 95* 99*  CO2 27 29  GLUCOSE 105* 95  BUN 16 13  CREATININE 0.57* 0.62  CALCIUM 9.2 8.7*   LFT  Recent Labs  03/10/17 0252  PROT 6.0*  ALBUMIN 3.1*  AST 15  ALT 13*  ALKPHOS 80  BILITOT 0.7   PT/INR  Recent Labs  03/09/17 2126  LABPROT 14.0  INR 1.08     Studies/Results: Dg Chest 2 View  Result  Date: 03/09/2017 CLINICAL DATA:  Nausea and epigastric pain over the last 10 days. EXAM: CHEST  2 VIEW COMPARISON:  None. FINDINGS: Heart size is normal. There is aortic atherosclerosis. Lungs are hyperinflated with likely emphysema. The lungs do not show any focal infiltrate, mass or volume loss. Snap artifacts overlie the chest. No effusions. No significant bone finding. IMPRESSION: No active disease.  Aortic atherosclerosis.  Probable emphysema. Electronically Signed   By: Nelson Chimes M.D.   On: 03/09/2017 11:27   Ct Abdomen Pelvis W Contrast  Result Date: 03/09/2017 CLINICAL DATA:  Epigastric pain with significant weight loss. EXAM: CT ABDOMEN AND PELVIS WITH CONTRAST TECHNIQUE: Multidetector CT imaging of the abdomen and pelvis was  performed using the standard protocol following bolus administration of intravenous contrast. CONTRAST:  160mL ISOVUE-300 IOPAMIDOL (ISOVUE-300) INJECTION 61% COMPARISON:  None. FINDINGS: Lower chest:  Calcified granuloma in the left lower lobe. Hepatobiliary: Numerous hepatic masses which appears solid in heterogeneously enhancing, largest measuring 5 cm in segment 6. No background cirrhotic features.No evidence of biliary obstruction or stone. Pancreas: Negative for mass or ductal obstruction. Spleen: Unremarkable. Adrenals/Urinary Tract: Negative adrenals. No hydronephrosis or stone. Small renal cysts. Unremarkable bladder. Stomach/Bowel: There is a mass at the gastric cardia projecting into the lumen, measuring at least 33 mm. Two enlarged nodes in the gastrohepatic ligament with appearance matching the liver lesions, 18 mm in maximal diameter. No bowel obstruction.  Extensive colonic diverticulosis. Vascular/Lymphatic: Severe atherosclerosis with short-segment left common iliac artery occlusion. Gastrohepatic ligament adenopathy as above. Reproductive:No pathologic findings. Other: No ascites or pneumoperitoneum. Musculoskeletal: No acute abnormalities. 1 mm sclerotic focus in the L2 vertebral body. IMPRESSION: 1. Findings of widespread hepatic metastatic disease. Gastric cardia mass and gastrohepatic ligament adenopathy implying gastric primary. 2. Attention on follow up to 1 cm sclerotic focus in L2 body. 3. Advanced atherosclerosis. Short-segment left common iliac occlusion. Electronically Signed   By: Monte Fantasia M.D.   On: 03/09/2017 12:45    Impression/Plan: Gastric mass with liver mets on CT in need of an EGD for tissue diagnosis to help guide palliative treatment. EGD today. Supportive care.    LOS: 1 day   [place] C.  03/10/2017, 9:34 AM

## 2017-03-10 NOTE — Interval H&P Note (Signed)
History and Physical Interval Note:  03/10/2017 9:39 AM  Larry Wood  has presented today for surgery, with the diagnosis of Gastric Mass; Epigastric pain  The various methods of treatment have been discussed with the patient and family. After consideration of risks, benefits and other options for treatment, the patient has consented to  Procedure(s): ESOPHAGOGASTRODUODENOSCOPY (EGD) WITH PROPOFOL (N/A) as a surgical intervention .  The patient's history has been reviewed, patient examined, no change in status, stable for surgery.  I have reviewed the patient's chart and labs.  Questions were answered to the patient's satisfaction.     [place] C.

## 2017-03-10 NOTE — Progress Notes (Signed)
Patient ID: Larry Wood, male   DOB: 12-Nov-1945, 71 y.o.   MRN: [id number]  [facility] PROGRESS NOTE  Larry Wood:[id number] DOB: 14-Jul-1946 DOA: 03/09/2017 PCP: Patient, No Pcp Per  HPI/Subjective: Patient feeling less pain once oral pain medication started. Patient having some epigastric pain. Patient had 15 pound weight loss.  Objective: Vitals:   03/10/17 1102 03/10/17 1104  BP:  (!) 154/66  Pulse: 84 78  Resp: 18 18  Temp:  97.9 F (36.6 C)    Filed Weights   03/09/17 1031 03/10/17 0927  Weight: 54.4 kg (120 lb) 54.4 kg (120 lb)    ROS: Review of Systems  Constitutional: Positive for malaise/fatigue. Negative for chills and fever.  Eyes: Negative for blurred vision.  Respiratory: Negative for cough and shortness of breath.   Cardiovascular: Negative for chest pain.  Gastrointestinal: Positive for abdominal pain and nausea. Negative for constipation, diarrhea and vomiting.  Genitourinary: Negative for dysuria.  Musculoskeletal: Negative for joint pain.  Neurological: Positive for weakness. Negative for dizziness and headaches.   Exam: Physical Exam  Constitutional: He is oriented to person, place, and time. He appears cachectic.  HENT:  Nose: No mucosal edema.  Mouth/Throat: No oropharyngeal exudate or posterior oropharyngeal edema.  Eyes: EOM and lids are normal. Pupils are equal, round, and reactive to light.  Pale conjunctiva  Neck: No JVD present. Carotid bruit is not present. No edema present. No thyroid mass and no thyromegaly present.  Cardiovascular: S1 normal and S2 normal.  Exam reveals no gallop.   No murmur heard. Pulses:      Dorsalis pedis pulses are 2+ on the right side, and 2+ on the left side.  Respiratory: No respiratory distress. He has no wheezes. He has no rhonchi. He has no rales.  GI: Soft. Bowel sounds are normal. There is no tenderness.  Musculoskeletal:       Right ankle: He exhibits no swelling.       Left ankle:  He exhibits no swelling.  Lymphadenopathy:    He has no cervical adenopathy.  Neurological: He is alert and oriented to person, place, and time. No cranial nerve deficit.  Skin: Skin is warm. No rash noted. Nails show no clubbing.  Psychiatric: He has a normal mood and affect.      Data Reviewed: Basic Metabolic Panel:  Recent Labs Lab 03/09/17 1056 03/10/17 0252  NA 131* 133*  K 4.0 4.6  CL 95* 99*  CO2 27 29  GLUCOSE 105* 95  BUN 16 13  CREATININE 0.57* 0.62  CALCIUM 9.2 8.7*   Liver Function Tests:  Recent Labs Lab 03/09/17 1056 03/10/17 0252  AST 22 15  ALT 16* 13*  ALKPHOS 98 80  BILITOT 0.6 0.7  PROT 7.3 6.0*  ALBUMIN 3.8 3.1*    Recent Labs Lab 03/09/17 1056  LIPASE 26   CBC:  Recent Labs Lab 03/09/17 1056 03/10/17 0252  WBC 12.1* 8.7  HGB 8.9* 8.1*  HCT 28.0* 24.6*  MCV 73.7* 74.3*  PLT 654* 559*   Cardiac Enzymes:  Recent Labs Lab 03/09/17 1056  TROPONINI <0.03     Studies: Dg Chest 2 View  Result Date: 03/09/2017 CLINICAL DATA:  Nausea and epigastric pain over the last 10 days. EXAM: CHEST  2 VIEW COMPARISON:  None. FINDINGS: Heart size is normal. There is aortic atherosclerosis. Lungs are hyperinflated with likely emphysema. The lungs do not show any focal infiltrate, mass or volume loss. Snap artifacts overlie the chest.  No effusions. No significant bone finding. IMPRESSION: No active disease.  Aortic atherosclerosis.  Probable emphysema. Electronically Signed   By: Nelson Chimes M.D.   On: 03/09/2017 11:27   Ct Abdomen Pelvis W Contrast  Result Date: 03/09/2017 CLINICAL DATA:  Epigastric pain with significant weight loss. EXAM: CT ABDOMEN AND PELVIS WITH CONTRAST TECHNIQUE: Multidetector CT imaging of the abdomen and pelvis was performed using the standard protocol following bolus administration of intravenous contrast. CONTRAST:  183mL ISOVUE-300 IOPAMIDOL (ISOVUE-300) INJECTION 61% COMPARISON:  None. FINDINGS: Lower chest:   Calcified granuloma in the left lower lobe. Hepatobiliary: Numerous hepatic masses which appears solid in heterogeneously enhancing, largest measuring 5 cm in segment 6. No background cirrhotic features.No evidence of biliary obstruction or stone. Pancreas: Negative for mass or ductal obstruction. Spleen: Unremarkable. Adrenals/Urinary Tract: Negative adrenals. No hydronephrosis or stone. Small renal cysts. Unremarkable bladder. Stomach/Bowel: There is a mass at the gastric cardia projecting into the lumen, measuring at least 33 mm. Two enlarged nodes in the gastrohepatic ligament with appearance matching the liver lesions, 18 mm in maximal diameter. No bowel obstruction.  Extensive colonic diverticulosis. Vascular/Lymphatic: Severe atherosclerosis with short-segment left common iliac artery occlusion. Gastrohepatic ligament adenopathy as above. Reproductive:No pathologic findings. Other: No ascites or pneumoperitoneum. Musculoskeletal: No acute abnormalities. 1 mm sclerotic focus in the L2 vertebral body. IMPRESSION: 1. Findings of widespread hepatic metastatic disease. Gastric cardia mass and gastrohepatic ligament adenopathy implying gastric primary. 2. Attention on follow up to 1 cm sclerotic focus in L2 body. 3. Advanced atherosclerosis. Short-segment left common iliac occlusion. Electronically Signed   By: Monte Fantasia M.D.   On: 03/09/2017 12:45    Scheduled Meds: . mouth rinse  15 mL Mouth Rinse BID  . metoprolol tartrate  12.5 mg Oral BID  . [START ON 03/13/2017] pantoprazole  40 mg Intravenous Q12H  . sodium chloride flush  3 mL Intravenous Q12H   Continuous Infusions: . pantoprozole (PROTONIX) infusion 8 mg/hr (03/10/17 1151)    Assessment/Plan:  1. Epigastric pain with Gastric mass with liver metastases. 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 Case discussed with gastroenterology Dr. Michail Sermon. He started Protonix drip because a gastric mass was bleeding a little bit. Pain control with IV morphine  and oral oxycodone.  2. Acute on chronic blood loss anemia. IV iron given by hematology. Check hemoglobin again tomorrow. 3. Hyponatremia. This has improved. 4. Blood pressure elevation yesterday secondary to pain  Code Status:     Code Status Orders        Start     Ordered   03/09/17 1503  Full code  Continuous     03/09/17 1502    Code Status History    Date Active Date Inactive Code Status Order ID Comments User Context   This patient has a current code status but no historical code status.     Family Communication: Wife at the bedside Disposition Plan: Evaluate daily on when to go home. Hemoglobin must be stable and pain must be controlled with oral medications  Consultants:  Oncology  Gastroenterology  Procedures:  Endoscopy with biopsy  Time spent: 28 minutes  [place], [place]

## 2017-03-10 NOTE — Anesthesia Procedure Notes (Signed)
Date/Time: 03/10/2017 9:32 AM Performed by: Doreen Salvage Pre-anesthesia Checklist: Patient identified, Emergency Drugs available, Suction available and Patient being monitored Patient Re-evaluated:Patient Re-evaluated prior to inductionOxygen Delivery Method: Nasal cannula Intubation Type: IV induction Dental Injury: Teeth and Oropharynx as per pre-operative assessment  Comments: Nasal cannula with etCO2 monitoring

## 2017-03-10 NOTE — Brief Op Note (Addendum)
Malignant-appearing gastric cardia mass with bleeding noted. Biopsies taken. See endopro for details/recs. Clear liquid diet ok. Poor prognosis. My recommendation is for comfort care measures but defer to oncology. Needs better pain control with IV pain meds and will defer type and dose to primary team. D/W Dr. Leslye Peer. D/W his wife and the patient in recovery.

## 2017-03-10 NOTE — Progress Notes (Signed)
Stable post EGD with partially obstructing gastric mass with bleeding found. Protonix drip started and infusing readily. Sipping and eating few bites of clear liquid diet. Oxycodone given for epigastric pain with adequate control. Zofran given for nausea effective. Pt reports constipated with MD notified; Dr. Leslye Peer does not wish to order bowel program at this time. Pt openly stated, " I have 6 months to live."  Pt has very flat affect; avoids direct eye contact at intervals.Wife at bedside at long intervals. Pt instructed to save next 2 stools for hemoccult with supplies provided. Pt awaiting bone scan.

## 2017-03-10 NOTE — Anesthesia Postprocedure Evaluation (Signed)
Anesthesia Post Note  Patient: ATWELL MCDANEL  Procedure(s) Performed: Procedure(s) (LRB): ESOPHAGOGASTRODUODENOSCOPY (EGD) WITH PROPOFOL (N/A)  Patient location during evaluation: Endoscopy Anesthesia Type: General Level of consciousness: awake and alert and oriented Pain management: pain level controlled Vital Signs Assessment: post-procedure vital signs reviewed and stable Respiratory status: spontaneous breathing, nonlabored ventilation and respiratory function stable Cardiovascular status: blood pressure returned to baseline and stable Postop Assessment: no signs of nausea or vomiting Anesthetic complications: no     Last Vitals:  Vitals:   03/10/17 1102 03/10/17 1104  BP:  (!) 154/66  Pulse: 84 78  Resp: 18 18  Temp:  36.6 C    Last Pain:  Vitals:   03/10/17 1117  TempSrc:   PainSc: 5                  Tyquan Carmickle

## 2017-03-10 NOTE — Plan of Care (Signed)
Problem: Education: Goal: Knowledge of [facility] Education information/materials will improve Outcome: Progressing VSS, free of falls during shift.  Reports pain 6/10, refuses intervention.  Reports nausea, received PRN IV Zofran 4mg  x1, refused further intervention.  No other needs overnight.  Aware of NPO status for planned procedure in AM.  Bed in low position, call bell within reach.  WCTM.

## 2017-03-10 NOTE — H&P (View-Only) (Signed)
Referring Provider: Dr. Leslye Peer Primary Care Physician:  Patient, No Pcp Per   Reason for Consultation:  Gastric mass; Epigastric pain  HPI: Larry Wood is a 71 y.o. male with a one week history of sharp epigastric pain and weight loss of 10 pounds in the past month. Denies dysphagia, N/V. Denies heartburn. He has a history of alcohol abuse and tobacco abuse. CT showed a mass in the gastric cardia with liver mets and gastric lymphadenopathy.  Past Medical History:  Diagnosis Date  . COPD (chronic obstructive pulmonary disease) ([place])     History reviewed. No pertinent surgical history.  Prior to Admission medications   Medication Sig Start Date End Date Taking? Authorizing Provider  aspirin EC 81 MG tablet Take 81 mg by mouth daily.   Yes [provider]    Scheduled Meds: . [MAR Hold] enoxaparin (LOVENOX) injection  40 mg Subcutaneous Q24H  . [MAR Hold] mouth rinse  15 mL Mouth Rinse BID  . [MAR Hold] metoprolol tartrate  12.5 mg Oral BID  . [MAR Hold] pantoprazole  40 mg Oral Daily  . [MAR Hold] sodium chloride flush  3 mL Intravenous Q12H   Continuous Infusions: . sodium chloride 20 mL/hr at 03/10/17 0844  . sodium chloride     PRN Meds:.[MAR Hold] fentaNYL (SUBLIMAZE) injection, [MAR Hold] ondansetron **OR** [MAR Hold] ondansetron (ZOFRAN) IV, [MAR Hold] sodium chloride flush, [MAR Hold] traMADol  Allergies as of 03/09/2017  . (No Known Allergies)    History reviewed. No pertinent family history.  Social History   Social History  . Marital status: Married    Spouse name: N/A  . Number of children: N/A  . Years of education: N/A   Occupational History  . Not on file.   Social History Main Topics  . Smoking status: Former Research scientist (life sciences)  . Smokeless tobacco: Never Used  . Alcohol use No  . Drug use: No  . Sexual activity: Not on file   Other Topics Concern  . Not on file   Social History Narrative  . No narrative on file    Review of Systems:  All negative except as stated above in HPI.  Physical Exam: Vital signs: Vitals:   03/10/17 0408 03/10/17 0740  BP: (!) 150/59 138/65  Pulse: 75 83  Resp: 20 20  Temp: 97.8 F (36.6 C) 98 F (36.7 C)   Last BM Date: 03/07/17 General:   Cachetic, elderly, lethargic, no acute distress HEENT: anicteric sclera, oropharynx clear Neck: thin, nontender Lungs:  Clear throughout to auscultation.   No wheezes, crackles, or rhonchi. No acute distress. Heart:  Regular rate and rhythm; no murmurs, clicks, rubs,  or gallops. Abdomen: upper quadrant tenderness with guarding, soft, nondistended, +BS  Rectal:  Deferred Ext: no edema  GI:  Lab Results:  Recent Labs  03/09/17 1056 03/10/17 0252  WBC 12.1* 8.7  HGB 8.9* 8.1*  HCT 28.0* 24.6*  PLT 654* 559*   BMET  Recent Labs  03/09/17 1056 03/10/17 0252  NA 131* 133*  K 4.0 4.6  CL 95* 99*  CO2 27 29  GLUCOSE 105* 95  BUN 16 13  CREATININE 0.57* 0.62  CALCIUM 9.2 8.7*   LFT  Recent Labs  03/10/17 0252  PROT 6.0*  ALBUMIN 3.1*  AST 15  ALT 13*  ALKPHOS 80  BILITOT 0.7   PT/INR  Recent Labs  03/09/17 2126  LABPROT 14.0  INR 1.08     Studies/Results: Dg Chest 2 View  Result  Date: 03/09/2017 CLINICAL DATA:  Nausea and epigastric pain over the last 10 days. EXAM: CHEST  2 VIEW COMPARISON:  None. FINDINGS: Heart size is normal. There is aortic atherosclerosis. Lungs are hyperinflated with likely emphysema. The lungs do not show any focal infiltrate, mass or volume loss. Snap artifacts overlie the chest. No effusions. No significant bone finding. IMPRESSION: No active disease.  Aortic atherosclerosis.  Probable emphysema. Electronically Signed   By: Nelson Chimes M.D.   On: 03/09/2017 11:27   Ct Abdomen Pelvis W Contrast  Result Date: 03/09/2017 CLINICAL DATA:  Epigastric pain with significant weight loss. EXAM: CT ABDOMEN AND PELVIS WITH CONTRAST TECHNIQUE: Multidetector CT imaging of the abdomen and pelvis was  performed using the standard protocol following bolus administration of intravenous contrast. CONTRAST:  150mL ISOVUE-300 IOPAMIDOL (ISOVUE-300) INJECTION 61% COMPARISON:  None. FINDINGS: Lower chest:  Calcified granuloma in the left lower lobe. Hepatobiliary: Numerous hepatic masses which appears solid in heterogeneously enhancing, largest measuring 5 cm in segment 6. No background cirrhotic features.No evidence of biliary obstruction or stone. Pancreas: Negative for mass or ductal obstruction. Spleen: Unremarkable. Adrenals/Urinary Tract: Negative adrenals. No hydronephrosis or stone. Small renal cysts. Unremarkable bladder. Stomach/Bowel: There is a mass at the gastric cardia projecting into the lumen, measuring at least 33 mm. Two enlarged nodes in the gastrohepatic ligament with appearance matching the liver lesions, 18 mm in maximal diameter. No bowel obstruction.  Extensive colonic diverticulosis. Vascular/Lymphatic: Severe atherosclerosis with short-segment left common iliac artery occlusion. Gastrohepatic ligament adenopathy as above. Reproductive:No pathologic findings. Other: No ascites or pneumoperitoneum. Musculoskeletal: No acute abnormalities. 1 mm sclerotic focus in the L2 vertebral body. IMPRESSION: 1. Findings of widespread hepatic metastatic disease. Gastric cardia mass and gastrohepatic ligament adenopathy implying gastric primary. 2. Attention on follow up to 1 cm sclerotic focus in L2 body. 3. Advanced atherosclerosis. Short-segment left common iliac occlusion. Electronically Signed   By: Monte Fantasia M.D.   On: 03/09/2017 12:45    Impression/Plan: Gastric mass with liver mets on CT in need of an EGD for tissue diagnosis to help guide palliative treatment. EGD today. Supportive care.    LOS: 1 day   Lyerly C.  03/10/2017, 9:34 AM

## 2017-03-10 NOTE — Op Note (Signed)
[facility] Gastroenterology Patient Name: Larry Wood Procedure Date: 03/10/2017 9:25 AM MRN: [id number] Account #: [id number] Date of Birth: [birth year] Admit Type: Inpatient Age: 71 Room: [facility] ENDO ROOM 4 Gender: Male Note Status: Finalized Procedure:            Upper GI endoscopy Indications:          Epigastric abdominal pain, Abnormal CT of the GI tract,                        Gastric Mass Providers:            Lear Ng, MD Medicines:            Propofol per Anesthesia, Monitored Anesthesia Care Complications:        No immediate complications. Procedure:            Pre-Anesthesia Assessment:                       - Prior to the procedure, a History and Physical was                        performed, and patient medications and allergies were                        reviewed. The patient's tolerance of previous                        anesthesia was also reviewed. The risks and benefits of                        the procedure and the sedation options and risks were                        discussed with the patient. All questions were                        answered, and informed consent was obtained. Prior                        Anticoagulants: The patient has taken no previous                        anticoagulant or antiplatelet agents. ASA Grade                        Assessment: II - A patient with mild systemic disease.                        After reviewing the risks and benefits, the patient was                        deemed in satisfactory condition to undergo the                        procedure.                       After obtaining informed consent, the endoscope was  passed under direct vision. Throughout the procedure,                        the patient's blood pressure, pulse, and oxygen                        saturations were monitored continuously. The                        Colonoscope was introduced through  the mouth, and                        advanced to the second part of duodenum. The upper GI                        endoscopy was accomplished without difficulty. The                        patient tolerated the procedure well. Findings:      The Z-line was found 48 cm from the incisors.      A large, fungating mass with bleeding was found at the gastroesophageal       junction, 48 cm from the incisors. The mass was partially obstructing       and partially circumferential (involving one-half of the lumen       circumference). Biopsies were taken with a cold forceps for histology.       Estimated blood loss was minimal.      The exam of the esophagus was otherwise normal.      A large, fungating, infiltrative and ulcerated, partially       circumferential (involving one-half of the lumen circumference) mass       with oozing bleeding and stigmata of recent bleeding was found in the       cardia. Biopsies were taken with a cold forceps for histology. Estimated       blood loss was minimal.      The examined duodenum was normal.      Red blood was found in the gastric fundus. Impression:           - Z-line, 48 cm from the incisors.                       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 Biopsied.                       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                       - Normal examined duodenum.                       - Red blood in the gastric fundus. Recommendation:       - Clear liquid diet.                       - Await pathology results.                       -  Give Protonix (pantoprazole): initiate therapy with                        80 mg IV bolus, then 8 mg/hr IV by continuous infusion. Procedure Code(s):    --- Professional ---                       [id number], Esophagogastroduodenoscopy, flexible, transoral;                        with biopsy, single or multiple Diagnosis  Code(s):    --- Professional ---                       C16.0, Malignant neoplasm of cardia                       K92.2, Gastrointestinal hemorrhage, unspecified                       R10.13, Epigastric pain                       R93.3, Abnormal findings on diagnostic imaging of other                        parts of digestive tract CPT copyright 2016 American Medical Association. All rights reserved. The codes documented in this report are preliminary and upon coder review may  be revised to meet current compliance requirements. Lear Ng, MD 03/10/2017 10:14:33 AM This report has been signed electronically. Number of Addenda: 0 Note Initiated On: 03/10/2017 9:25 AM      [facility]

## 2017-03-11 LAB — BASIC METABOLIC PANEL
Anion gap: 8 (ref 5–15)
BUN: 10 mg/dL (ref 6–20)
CALCIUM: 8.8 mg/dL — AB (ref 8.9–10.3)
CO2: 28 mmol/L (ref 22–32)
CREATININE: 0.53 mg/dL — AB (ref 0.61–1.24)
Chloride: 98 mmol/L — ABNORMAL LOW (ref 101–111)
Glucose, Bld: 84 mg/dL (ref 65–99)
Potassium: 4 mmol/L (ref 3.5–5.1)
SODIUM: 134 mmol/L — AB (ref 135–145)

## 2017-03-11 LAB — CBC
HEMATOCRIT: 25.9 % — AB (ref 40.0–52.0)
Hemoglobin: 8.3 g/dL — ABNORMAL LOW (ref 13.0–18.0)
MCH: 24.3 pg — ABNORMAL LOW (ref 26.0–34.0)
MCHC: 32.2 g/dL (ref 32.0–36.0)
MCV: 75.4 fL — ABNORMAL LOW (ref 80.0–100.0)
PLATELETS: 573 10*3/uL — AB (ref 150–440)
RBC: 3.43 MIL/uL — ABNORMAL LOW (ref 4.40–5.90)
RDW: 23.2 % — AB (ref 11.5–14.5)
WBC: 7.9 10*3/uL (ref 3.8–10.6)

## 2017-03-11 LAB — HEMOGLOBIN A1C
HEMOGLOBIN A1C: 5.5 % (ref 4.8–5.6)
MEAN PLASMA GLUCOSE: 111 mg/dL

## 2017-03-11 LAB — OCCULT BLOOD X 1 CARD TO LAB, STOOL: FECAL OCCULT BLD: POSITIVE — AB

## 2017-03-11 MED ORDER — HYDROCODONE-ACETAMINOPHEN 5-325 MG PO TABS: 1.0000 | ORAL_TABLET | ORAL | Status: DC | PRN

## 2017-03-11 MED ORDER — ONDANSETRON HCL 4 MG PO TABS: 4.0000 mg | ORAL_TABLET | Freq: Four times a day (QID) | ORAL | 0 refills | Status: DC | PRN

## 2017-03-11 MED ORDER — HYDROCODONE-ACETAMINOPHEN 5-325 MG PO TABS: 1.0000 | ORAL_TABLET | ORAL | 0 refills | Status: DC | PRN

## 2017-03-11 MED ORDER — POLYETHYLENE GLYCOL 3350 17 G PO PACK: 17.0000 g | PACK | Freq: Every day | ORAL | Status: DC | PRN

## 2017-03-11 MED ORDER — POLYETHYLENE GLYCOL 3350 17 G PO PACK: 17.0000 g | PACK | Freq: Every day | ORAL | 0 refills | Status: DC | PRN

## 2017-03-11 MED ORDER — PANTOPRAZOLE SODIUM 40 MG PO TBEC: 40.0000 mg | DELAYED_RELEASE_TABLET | Freq: Two times a day (BID) | ORAL | 0 refills | Status: DC

## 2017-03-11 MED ORDER — PROMETHAZINE HCL 25 MG PO TABS: 12.5000 mg | ORAL_TABLET | Freq: Four times a day (QID) | ORAL | Status: DC | PRN

## 2017-03-11 MED ORDER — IRON SUCROSE 20 MG/ML IV SOLN
400.0000 mg | Freq: Once | INTRAVENOUS | Status: AC
  Administered 2017-03-11: 11:00:00 400 mg via INTRAVENOUS
  Filled 2017-03-11: qty 20

## 2017-03-11 MED ORDER — PROMETHAZINE HCL 12.5 MG PO TABS: 12.5000 mg | ORAL_TABLET | Freq: Four times a day (QID) | ORAL | 0 refills | Status: DC | PRN

## 2017-03-11 NOTE — Discharge Summary (Signed)
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 COPD (chronic obstructive pulmonary disease) ([place])     HOSPITAL COURSE:   1.  Epigastric pain with gastric mass and liver metastases seen on CT scan. This is likely a gastric cancer. Endoscopy was done by Dr. Michail Sermon. Biopsy still pending at this point. Patient was started on a Protonix drip because a gastric mass had some bleeding. Patient had pain control with IV morphine and oral oxycodone. Patient states he will do better with hydrocodone syrup prescription for Percocet was given. The patient did tolerate food prior to discharge home. Patient will be on Protonix 40 mg by mouth twice a day. I did prescribe nausea medications Zofran and Phenergan. I did prescribe Percocet. 2. Acute on chronic blood loss anemia. 2 doses of IV iron given during the hospitalization. Hemoglobin 8.3 and stable upon discharge 3. Hyponatremia this has improved with IV fluid hydration 4. Elevated blood pressure without diagnosis of hypertension. Blood pressure can be borderline with pain. Recommend checking as outpatient. 5. Thrombocytosis secondary to likely cancerous process  DISCHARGE CONDITIONS:   Guarded  CONSULTS OBTAINED:  Treatment Team:  Sindy Guadeloupe, MD Jonathon Bellows, MD  DRUG ALLERGIES:  No Known Allergies  DISCHARGE MEDICATIONS:   Current Discharge Medication List    START taking these medications   Details  HYDROcodone-acetaminophen (NORCO/VICODIN) 5-325 MG tablet Take  1 tablet by mouth every 4 (four) hours as needed for moderate pain or severe pain. Qty: 30 tablet, Refills: 0    ondansetron (ZOFRAN) 4 MG tablet Take 1 tablet (4 mg total) by mouth every 6 (six) hours as needed for nausea. Qty: 20 tablet, Refills: 0    pantoprazole (PROTONIX) 40 MG tablet Take 1 tablet (40 mg total) by mouth 2 (two) times daily. Qty: 60 tablet, Refills: 0    polyethylene glycol (MIRALAX / GLYCOLAX) packet Take 17 g by mouth daily as needed for moderate constipation. Qty: 30 each, Refills: 0    promethazine (PHENERGAN) 12.5 MG tablet Take 1 tablet (12.5 mg total) by mouth every 6 (six) hours as needed for nausea. Qty: 30 tablet, Refills: 0      STOP taking these medications     aspirin EC 81 MG tablet          DISCHARGE INSTRUCTIONS:   Follow-up with Dr. Janese Banks oncology as outpatient  If you experience worsening of your admission symptoms, develop shortness of breath, life threatening emergency, suicidal or homicidal thoughts you must seek medical attention immediately by calling 911 or calling your MD immediately  if symptoms less severe.  You Must read complete instructions/literature along with all the possible adverse reactions/side effects for all the Medicines you take and that have been prescribed to you. Take any new Medicines after you have completely understood and accept all the possible adverse reactions/side effects.   Please note  You were cared for by a hospitalist during your hospital stay. If you have any questions about your discharge medications or the care you received while  you were in the hospital after you are discharged, you can call the unit and asked to speak with the hospitalist on call if the hospitalist that took care of you is not available. Once you are discharged, your primary care physician will handle any further medical issues. Please note that NO REFILLS for any discharge medications will be authorized once you are discharged, as it  is imperative that you return to your primary care physician (or establish a relationship with a primary care physician if you do not have one) for your aftercare needs so that they can reassess your need for medications and monitor your lab values.    Today   CHIEF COMPLAINT:   Chief Complaint  Patient presents with  . Abdominal Pain    HISTORY OF PRESENT ILLNESS:  Larry Wood  is a 71 y.o. male presented with abdominal pain found to have gastric mass and liver metastases on CT scan   VITAL SIGNS:  Blood pressure (!) 154/70, pulse 89, temperature 98.2 F (36.8 C), temperature source Oral, resp. rate 18, height 5\' 10"  (1.778 m), weight 51.3 kg (113 lb 1.6 oz), SpO2 97 %.   PHYSICAL EXAMINATION:  GENERAL:  71 y.o.-year-old patient lying in the bed with no acute distress.  EYES: Pupils equal, round, reactive to light and accommodation. No scleral icterus. Extraocular muscles intact.  HEENT: Head atraumatic, normocephalic. Oropharynx and nasopharynx clear.  NECK:  Supple, no jugular venous distention. No thyroid enlargement, no tenderness.  LUNGS: Normal breath sounds bilaterally, no wheezing, rales,rhonchi or crepitation. No use of accessory muscles of respiration.  CARDIOVASCULAR: S1, S2 normal. No murmurs, rubs, or gallops.  ABDOMEN: Soft, slight epigastric tenderness, non-distended. Bowel sounds present. No organomegaly or mass.  EXTREMITIES: No pedal edema, cyanosis, or clubbing.  NEUROLOGIC: Cranial nerves II through XII are intact. Muscle strength 5/5 in all extremities. Sensation intact. Gait not checked.  PSYCHIATRIC: The patient is alert and oriented x 3.  SKIN: No obvious rash, lesion, or ulcer.   DATA REVIEW:   CBC  Recent Labs Lab 03/11/17 0529  WBC 7.9  HGB 8.3*  HCT 25.9*  PLT 573*    Chemistries   Recent Labs Lab 03/10/17 0252 03/11/17 0529  NA 133* 134*  K 4.6 4.0  CL 99* 98*  CO2 29 28  GLUCOSE 95 84  BUN 13 10  CREATININE 0.62 0.53*   CALCIUM 8.7* 8.8*  AST 15  --   ALT 13*  --   ALKPHOS 80  --   BILITOT 0.7  --     Cardiac Enzymes  Recent Labs Lab 03/09/17 1056  TROPONINI <0.03     RADIOLOGY:  Ct Chest W Contrast  Result Date: 03/10/2017 CLINICAL DATA:  Gastric cancer.  Staging.  COPD. EXAM: CT CHEST WITH CONTRAST TECHNIQUE: Multidetector CT imaging of the chest was performed during intravenous contrast administration. CONTRAST:  13mL ISOVUE-300 IOPAMIDOL (ISOVUE-300) INJECTION 61% COMPARISON:  Abdominal CT of 03/09/2017. Chest radiograph 03/09/2017. FINDINGS: Cardiovascular: Aortic and branch vessel atherosclerosis. Normal heart size, without pericardial effusion. Multivessel coronary artery atherosclerosis. No central pulmonary embolism, on this non-dedicated study. Mediastinum/Nodes: No supraclavicular adenopathy. No mediastinal or hilar adenopathy. Lungs/Pleura: No pleural fluid. Moderate centrilobular and paraseptal emphysema. Biapical pleural-parenchymal scarring. Mild left base scarring or atelectasis medially. Scattered calcified granulomas. Lingular nodule measures 7 mm on image 134/series 3. Upper Abdomen: Extensive hepatic and abdominal nodal metastasis, as detailed on the dedicated CT. Gastric cardia lesion again identified. Abdominal aortic atherosclerosis. Musculoskeletal: Lucent lesion of  10 mm within the superior aspect of the L2 vertebral body. IMPRESSION: 1. 7 mm lingular nodule is nonspecific. Recommend attention on follow-up. Otherwise, no evidence of metastatic disease in the chest. 2. Centrilobular and paraseptal emphysema. 3.  Coronary artery atherosclerosis. Aortic atherosclerosis. 4. L2 lucent lesion is favored to represent a hemangioma. The L2 sclerotic lesion on the prior abdominal CT is less apparent today. Electronically Signed   By: Abigail Miyamoto M.D.   On: 03/10/2017 16:22   Nm Bone Scan Whole Body  Result Date: 03/10/2017 CLINICAL DATA:  71 year old male with hepatic metastases and gastric  mass. Subtle sclerotic area within the L2 vertebral body on recent CT. EXAM: NUCLEAR MEDICINE WHOLE BODY BONE SCAN TECHNIQUE: Whole body anterior and posterior images were obtained approximately 3 hours after intravenous injection of radiopharmaceutical. RADIOPHARMACEUTICALS:  24.4 mCi Technetium-66m MDP IV COMPARISON:  03/09/2017 CT FINDINGS: No abnormal areas of bony activity are noted to suggest osseous metastatic disease. There is no evidence of abnormal activity within the L2 vertebral body. No significant abnormalities are noted. IMPRESSION: No evidence of bony metastatic disease. Electronically Signed   By: Margarette Canada M.D.   On: 03/10/2017 17:08     Management plans discussed with the patient, family and they are in agreement.  CODE STATUS:     Code Status Orders        Start     Ordered   03/09/17 1503  Full code  Continuous     03/09/17 1502    Code Status History    Date Active Date Inactive Code Status Order ID Comments User Context   This patient has a current code status but no historical code status.      TOTAL TIME TAKING CARE OF THIS PATIENT: 35 minutes.    Loletha Grayer M.D on 03/11/2017 at 2:29 PM  Between 7am to 6pm - Pager - [phone]  After 6pm go to [email or web address] - password [email or web address] [facility]  [phone]  CC: Primary care physician; Dr. Randa Evens

## 2017-03-11 NOTE — Progress Notes (Signed)
Gastroenterology Progress Note    Larry Wood 71 y.o. October 30, 1945   Subjective: Pain better controlled but having nausea. Drinking minimal to no liquids. Black stool yesterday evening. Brown stool this morning. Wife at bedside.  Objective: Vital signs in last 24 hours: Vitals:   03/10/17 2007 03/11/17 0356  BP: (!) 156/60 (!) 154/70  Pulse: 78 89  Resp: 20 18  Temp: 97.7 F (36.5 C) 98.2 F (36.8 C)    Physical Exam: Gen: alert, no acute distress, cachetic HEENT: anicteric sclera CV: RRR Chest: CTA B Abd: Less tender in epigastric region with minimal guarding, soft, nondistended, +BS   Lab Results:  Recent Labs  03/10/17 0252 03/11/17 0529  NA 133* 134*  K 4.6 4.0  CL 99* 98*  CO2 29 28  GLUCOSE 95 84  BUN 13 10  CREATININE 0.62 0.53*  CALCIUM 8.7* 8.8*    Recent Labs  03/09/17 1056 03/10/17 0252  AST 22 15  ALT 16* 13*  ALKPHOS 98 80  BILITOT 0.6 0.7  PROT 7.3 6.0*  ALBUMIN 3.8 3.1*    Recent Labs  03/10/17 0252 03/11/17 0529  WBC 8.7 7.9  HGB 8.1* 8.3*  HCT 24.6* 25.9*  MCV 74.3* 75.4*  PLT 559* 573*    Recent Labs  03/09/17 2126  LABPROT 14.0  INR 1.08      Assessment/Plan: Malignant-appearing gastric mass with liver mets (path pending - reportedly results will be back 03/13/17). Encouraged to try liquids and if tolerates liquids would change to soft diet. Ok to go home today on PO pain meds and antiemetics if can tolerate PO diet. F/U with oncology. D/W Dr. Leslye Peer.   Natoma C. 03/11/2017, 9:04 AMPatient ID: Larry Wood, male   DOB: 1946-05-01, 70 y.o.   MRN: [id number]

## 2017-03-11 NOTE — Plan of Care (Signed)
Problem: Pain Managment: Goal: General experience of comfort will improve Outcome: Progressing No pain medication this shift

## 2017-03-11 NOTE — Progress Notes (Signed)
Pt request to wash up but after supplies to wash provided, he decided to wait. Pt states he wants to take a shower.  I recommended that he wait until seen by physician as he would need an order to shower.  Explained to pt concern that he would fall or become dizzy with low Hgb. Pt not receptive to information. Dorna Bloom RN

## 2017-03-11 NOTE — Progress Notes (Addendum)
Pt has very poor appetite with sips only of clear liquids. Oxycodone effective for pain but pt experiences increased nausea with small amount emesis after dose. MD made aware with orders modified. Wife in with pt. No signs of active bleeding at this time. Protonix drip continued.

## 2017-03-11 NOTE — Progress Notes (Signed)
Transported in transport chair to private vehicle. Discharge home to self/wife's care.

## 2017-03-11 NOTE — Progress Notes (Signed)
Tolerated lunch; denies nausea; reports pain 2-3-refuses po pain med at this time. Oral and witten AVS instructions given to pt and wife with stated understanding. Written prescriptions given to wife/pt. No signs active bleeding. Pt talking more, improved direct eye contact, occasional smile.

## 2017-03-11 NOTE — Progress Notes (Signed)
Iron sucrose and pantoprazole infusions tubed to 1C at 11:06.

## 2017-03-11 NOTE — Discharge Instructions (Addendum)
Follow up with Dr Janese Banks for biopsy results and Plan of care

## 2017-03-11 NOTE — Progress Notes (Signed)
Initial Nutrition Assessment  DOCUMENTATION CODES:   Severe malnutrition in context of chronic illness, Underweight  INTERVENTION:  Encouraged small, frequent meals throughout the day (6-8 per day). Discussed eating calorie- and protein-dense foods to obtain adequate calories and protein to help prevent further unintentional weight loss. Discussed food items patient will enjoy such as peanut butter crackers, ice cream, yogurt, sandwiches.  Also discussed oral nutrition supplement options for patient (he does not want to drink these regularly yet). Encouraged use if small, frequent meals does not help prevent further unintentional weight loss. Discussed ways to help them taste better such as blending with well-washed fruit or ice cream.  NUTRITION DIAGNOSIS:   Malnutrition (Severe) related to chronic illness (gastric mass, gastric lymphadenopathy, liver metastases) as evidenced by severe depletion of body fat, severe depletion of muscle mass, 10.6 percent weight loss over 6 months or less per pt report.  GOAL:   Patient will meet greater than or equal to 90% of their needs  MONITOR:   PO intake, Labs, Weight trends, I & O's  REASON FOR ASSESSMENT:   Malnutrition Screening Tool    ASSESSMENT:   71 year old male with PMHx of COPD, remote hx of smoking and EtOH abuse, presented with epigastric pain and weight loss found to have mass in gastric cardia, multiple liver metastases, and gastric lymphadenopathy.   -Per Oncology note if patient pursues chemotherapy it would be palliative and not curative.  Spoke with patient and his wife at bedside. Patient has a flat affect. He reports his appetite has been poor for a few weeks now. He is experiencing abdominal pain. Has also been having nausea here because of the pain medications per his report. Patient reports his typical intake is some cereal for breakfast, a sandwich for lunch, and a "meal" at dinner. Unable to provide any further  details. Reporting he is normally able to finish these meals, which does not align with report of intake here. Patient reports he has some Boost at home, but does not want to drink it regularly. Discussed small, frequent meals instead.  UBW 130 lbs. RD obtained bed scale weight of 113.1 lbs. Patient has lost 16.9 lbs (10.6% body weight) over unknown time period. Patient believes it has been over several months to six months, which is still significant for time frame.  Medications reviewed and include: Medline mouth rinse, pantoprazole, iron sucroce 400 mg once IV today, Zofran PRN, oxycodone PRN.  Labs reviewed: Sodium 134, Chloride 98, Creatinine 0.53, Platelets 573.   Nutrition-Focused physical exam completed. Findings are severe fat depletion, severe muscle depletion, and no edema. Skin is pallor.  Discussed with RN. Patient has only been taking sips of clear liquids. Having nausea related to pain medications. Pain medications are being changed to see if that helps. Patient has reported he only has six months to live. Wants to go home so plan is to discharge today after being advanced to soft diet.  Diet Order:  DIET SOFT Room service appropriate? Yes; Fluid consistency: Thin  Skin:  Reviewed, no issues  Last BM:  03/11/2017 - type 4  Height:   Ht Readings from Last 1 Encounters:  03/10/17 5\' 10"  (1.778 m)    Weight:   Wt Readings from Last 1 Encounters:  03/11/17 113 lb 1.6 oz (51.3 kg)    Ideal Body Weight:  75.5 kg  BMI:  Body mass index is 16.23 kg/m.  Estimated Nutritional Needs:   Kcal:  1685-1940 (MSJ x 1.3-1.5)  Protein:  75-90 grams (1.5-1.7 grams/kg)  Fluid:  1.5-1.8 L/day (30-35 ml/kg)  EDUCATION NEEDS:   Education needs addressed  Willey Blade, MS, RD, LDN Pager: [phone] After Hours Pager: [phone]

## 2017-03-12 ENCOUNTER — Encounter: Payer: Self-pay | Admitting: Gastroenterology

## 2017-03-13 ENCOUNTER — Other Ambulatory Visit: Payer: Self-pay | Admitting: Pathology

## 2017-03-13 ENCOUNTER — Other Ambulatory Visit: Payer: Self-pay | Admitting: *Deleted

## 2017-03-13 DIAGNOSIS — D649 Anemia, unspecified: Secondary | ICD-10-CM

## 2017-03-13 DIAGNOSIS — C169 Malignant neoplasm of stomach, unspecified: Secondary | ICD-10-CM

## 2017-03-15 ENCOUNTER — Other Ambulatory Visit: Payer: Self-pay | Admitting: Pathology

## 2017-03-19 ENCOUNTER — Encounter: Payer: Self-pay | Admitting: Oncology

## 2017-03-19 ENCOUNTER — Inpatient Hospital Stay: Payer: PPO

## 2017-03-19 ENCOUNTER — Inpatient Hospital Stay: Payer: PPO | Attending: Oncology | Admitting: Oncology

## 2017-03-19 ENCOUNTER — Telehealth: Payer: Self-pay | Admitting: *Deleted

## 2017-03-19 ENCOUNTER — Other Ambulatory Visit: Payer: Self-pay | Admitting: *Deleted

## 2017-03-19 VITALS — BP 153/70 | HR 82 | Temp 97.0°F | Resp 18 | Wt 117.1 lb

## 2017-03-19 DIAGNOSIS — C169 Malignant neoplasm of stomach, unspecified: Secondary | ICD-10-CM

## 2017-03-19 DIAGNOSIS — G893 Neoplasm related pain (acute) (chronic): Secondary | ICD-10-CM | POA: Diagnosis not present

## 2017-03-19 DIAGNOSIS — C160 Malignant neoplasm of cardia: Secondary | ICD-10-CM

## 2017-03-19 DIAGNOSIS — R634 Abnormal weight loss: Secondary | ICD-10-CM | POA: Diagnosis not present

## 2017-03-19 DIAGNOSIS — D500 Iron deficiency anemia secondary to blood loss (chronic): Secondary | ICD-10-CM

## 2017-03-19 DIAGNOSIS — Z7189 Other specified counseling: Secondary | ICD-10-CM

## 2017-03-19 DIAGNOSIS — R918 Other nonspecific abnormal finding of lung field: Secondary | ICD-10-CM | POA: Diagnosis not present

## 2017-03-19 DIAGNOSIS — Z87891 Personal history of nicotine dependence: Secondary | ICD-10-CM | POA: Diagnosis not present

## 2017-03-19 DIAGNOSIS — D509 Iron deficiency anemia, unspecified: Secondary | ICD-10-CM | POA: Diagnosis not present

## 2017-03-19 DIAGNOSIS — J449 Chronic obstructive pulmonary disease, unspecified: Secondary | ICD-10-CM

## 2017-03-19 DIAGNOSIS — R109 Unspecified abdominal pain: Secondary | ICD-10-CM | POA: Diagnosis not present

## 2017-03-19 DIAGNOSIS — C799 Secondary malignant neoplasm of unspecified site: Secondary | ICD-10-CM

## 2017-03-19 DIAGNOSIS — I700 Atherosclerosis of aorta: Secondary | ICD-10-CM

## 2017-03-19 DIAGNOSIS — C787 Secondary malignant neoplasm of liver and intrahepatic bile duct: Secondary | ICD-10-CM

## 2017-03-19 DIAGNOSIS — M899 Disorder of bone, unspecified: Secondary | ICD-10-CM

## 2017-03-19 DIAGNOSIS — Z5111 Encounter for antineoplastic chemotherapy: Secondary | ICD-10-CM | POA: Insufficient documentation

## 2017-03-19 DIAGNOSIS — D649 Anemia, unspecified: Secondary | ICD-10-CM

## 2017-03-19 DIAGNOSIS — C772 Secondary and unspecified malignant neoplasm of intra-abdominal lymph nodes: Secondary | ICD-10-CM | POA: Diagnosis not present

## 2017-03-19 DIAGNOSIS — E46 Unspecified protein-calorie malnutrition: Secondary | ICD-10-CM | POA: Insufficient documentation

## 2017-03-19 DIAGNOSIS — R64 Cachexia: Secondary | ICD-10-CM | POA: Insufficient documentation

## 2017-03-19 DIAGNOSIS — C159 Malignant neoplasm of esophagus, unspecified: Secondary | ICD-10-CM

## 2017-03-19 LAB — CBC WITH DIFFERENTIAL/PLATELET
BASOS PCT: 1 %
Basophils Absolute: 0.1 10*3/uL (ref 0–0.1)
EOS PCT: 4 %
Eosinophils Absolute: 0.5 10*3/uL (ref 0–0.7)
HEMATOCRIT: 28.8 % — AB (ref 40.0–52.0)
Hemoglobin: 9 g/dL — ABNORMAL LOW (ref 13.0–18.0)
Lymphocytes Relative: 9 %
Lymphs Abs: 1 10*3/uL (ref 1.0–3.6)
MCH: 24.4 pg — ABNORMAL LOW (ref 26.0–34.0)
MCHC: 31.4 g/dL — AB (ref 32.0–36.0)
MCV: 77.8 fL — AB (ref 80.0–100.0)
MONO ABS: 1.1 10*3/uL — AB (ref 0.2–1.0)
MONOS PCT: 10 %
NEUTROS ABS: 7.9 10*3/uL — AB (ref 1.4–6.5)
Neutrophils Relative %: 76 %
PLATELETS: 441 10*3/uL — AB (ref 150–440)
RBC: 3.7 MIL/uL — ABNORMAL LOW (ref 4.40–5.90)
RDW: 25.2 % — AB (ref 11.5–14.5)
WBC: 10.5 10*3/uL (ref 3.8–10.6)

## 2017-03-19 LAB — COMPREHENSIVE METABOLIC PANEL
ALT: 18 U/L (ref 17–63)
AST: 21 U/L (ref 15–41)
Albumin: 3.5 g/dL (ref 3.5–5.0)
Alkaline Phosphatase: 95 U/L (ref 38–126)
Anion gap: 8 (ref 5–15)
BUN: 14 mg/dL (ref 6–20)
CO2: 30 mmol/L (ref 22–32)
Calcium: 8.8 mg/dL — ABNORMAL LOW (ref 8.9–10.3)
Chloride: 93 mmol/L — ABNORMAL LOW (ref 101–111)
Creatinine, Ser: 0.66 mg/dL (ref 0.61–1.24)
GFR calc Af Amer: >60 mL/min (ref >60)
GFR calc non Af Amer: >60 mL/min (ref >60)
Glucose, Bld: 100 mg/dL — ABNORMAL HIGH (ref 65–99)
Potassium: 4.4 mmol/L (ref 3.5–5.1)
Sodium: 131 mmol/L — ABNORMAL LOW (ref 135–145)
Total Bilirubin: 0.4 mg/dL (ref 0.3–1.2)
Total Protein: 6.3 g/dL — ABNORMAL LOW (ref 6.5–8.1)

## 2017-03-19 LAB — SAMPLE TO BLOOD BANK

## 2017-03-19 MED ORDER — LORAZEPAM 0.5 MG PO TABS: 0.5000 mg | ORAL_TABLET | Freq: Four times a day (QID) | ORAL | 0 refills | Status: DC | PRN

## 2017-03-19 MED ORDER — HYDROCODONE-ACETAMINOPHEN 5-325 MG PO TABS: 1.0000 | ORAL_TABLET | ORAL | 0 refills | Status: DC | PRN

## 2017-03-19 MED ORDER — PROCHLORPERAZINE MALEATE 10 MG PO TABS: 10.0000 mg | ORAL_TABLET | Freq: Four times a day (QID) | ORAL | 1 refills | Status: DC | PRN

## 2017-03-19 MED ORDER — LIDOCAINE-PRILOCAINE 2.5-2.5 % EX CREA: TOPICAL_CREAM | CUTANEOUS | 3 refills | Status: DC

## 2017-03-19 MED ORDER — DEXAMETHASONE 4 MG PO TABS: 8.0000 mg | ORAL_TABLET | Freq: Every day | ORAL | 1 refills | Status: DC

## 2017-03-19 MED ORDER — ONDANSETRON HCL 8 MG PO TABS: 8.0000 mg | ORAL_TABLET | Freq: Two times a day (BID) | ORAL | 1 refills | Status: DC | PRN

## 2017-03-19 NOTE — Telephone Encounter (Signed)
Called and spoke to wife about port placement 6/20 arrive at 8:15 for 9:15 procedure, NPO after MN, have a driver and sips of water for meds he takes that morning. [facility] at medical mall at [facility]. Then appt for chemo education cancer center 6/21 9 am Then 6/26 10:45 for lab/see md/ treatment I went over all meds that have been sent to pharmacy for when he starts treatment and went over how to put on emla cream about1 hour before coming over and plae saran wrap over the cream to protect his clothing and when he comes I will write out paper with instructions about his PRN meds he may need for side effects of chemo.wife agreeable and has all dates and she will call me for quesitons.

## 2017-03-19 NOTE — Progress Notes (Signed)
START ON PATHWAY REGIMEN - Gastroesophageal     A cycle is every 14 days:     Oxaliplatin      Leucovorin      5-Fluorouracil      5-Fluorouracil   **Always confirm dose/schedule in your pharmacy ordering system**    Patient Characteristics: Distant Metastases (cM1/pM1) / Locally Recurrent Disease, Adenocarcinoma - Esophageal, GE Junction, and Gastric, First Line, HER2 Negative / Unknown Histology: Adenocarcinoma Disease Classification: GE Junction Therapeutic Status: Distant Metastases (No Additional Staging) Would you be surprised if this patient died  in the next year? I would NOT be surprised if this patient died in the next year Line of therapy: First Line HER2 Status: Awaiting Test Results Intent of Therapy: Non-Curative / Palliative Intent, Discussed with Patient

## 2017-03-19 NOTE — Progress Notes (Signed)
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  He has been havving dark tarry stools for about 3 weeks prior to that.  He is also had a significant weight loss of about 10 pounds over the last few months. CT abdomen done in the ER showed multiple liver metastases as well as a mass in the gastric cardia projecting into the lumen as well as adenopathy in the gastrohepatic ligament  EGD showed: Large fungating mass with bleeding at the GE junction 48 cm from the incisors. Mass was partially obstructing and partially circumferential involving one half of the lumen. Large fungating infiltrative and ulcerated and partially circumferential mass involving one half of the lumen circumference bleeding and stigmata of recent bleeding also found in the gastric cardia  Stomach mass cardia biopsy showed poorly differentiated adenocarcinoma. Her 2 neu and PDL1 testing results pending  CT thorax showed 7 mm lingular nodule. No evidence of bone mets on bone scan. L2 lucent lesion likely hemangioma    Interval history- pain well controlled with hydrocodone. No dark tarry stools or bleeding. He is still fatigued but more active since discharge  ECOG PS- 2 Pain scale- 0 Opioid associated constipation- no  Review of systems- Review of Systems  Constitutional: Positive for malaise/fatigue and weight loss. Negative for chills and fever.  HENT: Negative for congestion, ear discharge and  nosebleeds.   Eyes: Negative for blurred vision.  Respiratory: Negative for cough, hemoptysis, sputum production, shortness of breath and wheezing.   Cardiovascular: Negative for chest pain, palpitations, orthopnea and claudication.  Gastrointestinal: Positive for abdominal pain. Negative for blood in stool, constipation, diarrhea, heartburn, melena, nausea and vomiting.  Genitourinary: Negative for dysuria, flank pain, frequency, hematuria and urgency.  Musculoskeletal: Negative for back pain, joint pain and myalgias.  Skin: Negative for rash.  Neurological: Negative for dizziness, tingling, focal weakness, seizures, weakness and headaches.  Endo/Heme/Allergies: Does not bruise/bleed easily.  Psychiatric/Behavioral: Negative for depression and suicidal ideas. The patient does not have insomnia.      No Known Allergies   Past Medical History:  Diagnosis Date  . COPD (chronic obstructive pulmonary disease) ([place])      Past Surgical History:  Procedure Laterality Date  . ESOPHAGOGASTRODUODENOSCOPY (EGD) WITH PROPOFOL N/A 03/10/2017   Procedure: ESOPHAGOGASTRODUODENOSCOPY (EGD) WITH PROPOFOL;  Surgeon: Wilford Corner, MD;  [facility] ENDOSCOPY;  Service: Endoscopy;  Laterality: N/A;    Social History   Social History  . Marital status: Married    Spouse name: N/A  . Number of children: N/A  . Years of education: N/A   Occupational History  . Not on file.   Social History Main Topics  . Smoking status: Former Research scientist (life sciences)  . Smokeless tobacco: Never Used  . Alcohol use No  . Drug use: No  . Sexual activity: Not on file   Other Topics Concern  . Not on file   Social History Narrative  . No narrative on file    No family history on file.   Current  Outpatient Prescriptions:  .  ondansetron (ZOFRAN) 4 MG tablet, Take 1 tablet (4 mg total) by mouth every 6 (six) hours as needed for nausea., Disp: 20 tablet, Rfl: 0 .  pantoprazole (PROTONIX) 40 MG tablet, Take 1 tablet  (40 mg total) by mouth 2 (two) times daily., Disp: 60 tablet, Rfl: 0 .  HYDROcodone-acetaminophen (NORCO/VICODIN) 5-325 MG tablet, Take 1 tablet by mouth every 4 (four) hours as needed for moderate pain or severe pain., Disp: 30 tablet, Rfl: 0 .  polyethylene glycol (MIRALAX / GLYCOLAX) packet, Take 17 g by mouth daily as needed for moderate constipation. (Patient not taking: Reported on 03/19/2017), Disp: 30 each, Rfl: 0 .  promethazine (PHENERGAN) 12.5 MG tablet, Take 1 tablet (12.5 mg total) by mouth every 6 (six) hours as needed for nausea. (Patient not taking: Reported on 03/19/2017), Disp: 30 tablet, Rfl: 0  Physical exam:  Vitals:   03/19/17 0916  BP: (!) 153/70  Pulse: 82  Resp: 18  Temp: 97 F (36.1 C)  TempSrc: Tympanic  Weight: 117 lb 1 oz (53.1 kg)   Physical Exam  Constitutional: He is oriented to person, place, and time.  Thin and cachectic. In no acute distress  HENT:  Head: Normocephalic and atraumatic.  Eyes: EOM are normal. Pupils are equal, round, and reactive to light.  Neck: Normal range of motion.  Cardiovascular: Normal rate, regular rhythm and normal heart sounds.   Pulmonary/Chest: Effort normal and breath sounds normal.  Abdominal: Soft. Bowel sounds are normal.  Neurological: He is alert and oriented to person, place, and time.  Skin: Skin is warm and dry.     CMP Latest Ref Rng & Units 03/11/2017  Glucose 65 - 99 mg/dL 84  BUN 6 - 20 mg/dL 10  Creatinine 0.61 - 1.24 mg/dL 0.53(L)  Sodium 135 - 145 mmol/L 134(L)  Potassium 3.5 - 5.1 mmol/L 4.0  Chloride 101 - 111 mmol/L 98(L)  CO2 22 - 32 mmol/L 28  Calcium 8.9 - 10.3 mg/dL 8.8(L)  Total Protein 6.5 - 8.1 g/dL -  Total Bilirubin 0.3 - 1.2 mg/dL -  Alkaline Phos 38 - 126 U/L -  AST 15 - 41 U/L -  ALT 17 - 63 U/L -   CBC Latest Ref Rng & Units 03/11/2017  WBC 3.8 - 10.6 K/uL 7.9  Hemoglobin 13.0 - 18.0 g/dL 8.3(L)  Hematocrit 40.0 - 52.0 % 25.9(L)  Platelets 150 - 440 K/uL 573(H)    No images  are attached to the encounter.  Dg Chest 2 View  Result Date: 03/09/2017 CLINICAL DATA:  Nausea and epigastric pain over the last 10 days. EXAM: CHEST  2 VIEW COMPARISON:  None. FINDINGS: Heart size is normal. There is aortic atherosclerosis. Lungs are hyperinflated with likely emphysema. The lungs do not show any focal infiltrate, mass or volume loss. Snap artifacts overlie the chest. No effusions. No significant bone finding. IMPRESSION: No active disease.  Aortic atherosclerosis.  Probable emphysema. Electronically Signed   By: Nelson Chimes M.D.   On: 03/09/2017 11:27   Ct Chest W Contrast  Result Date: 03/10/2017 CLINICAL DATA:  Gastric cancer.  Staging.  COPD. EXAM: CT CHEST WITH CONTRAST TECHNIQUE: Multidetector CT imaging of the chest was performed during intravenous contrast administration. CONTRAST:  64m ISOVUE-300 IOPAMIDOL (ISOVUE-300) INJECTION 61% COMPARISON:  Abdominal CT of 03/09/2017. Chest radiograph 03/09/2017. FINDINGS: Cardiovascular: Aortic and branch vessel atherosclerosis. Normal heart size, without pericardial effusion. Multivessel coronary artery atherosclerosis. No central pulmonary embolism, on this non-dedicated  study. Mediastinum/Nodes: No supraclavicular adenopathy. No mediastinal or hilar adenopathy. Lungs/Pleura: No pleural fluid. Moderate centrilobular and paraseptal emphysema. Biapical pleural-parenchymal scarring. Mild left base scarring or atelectasis medially. Scattered calcified granulomas. Lingular nodule measures 7 mm on image 134/series 3. Upper Abdomen: Extensive hepatic and abdominal nodal metastasis, as detailed on the dedicated CT. Gastric cardia lesion again identified. Abdominal aortic atherosclerosis. Musculoskeletal: Lucent lesion of 10 mm within the superior aspect of the L2 vertebral body. IMPRESSION: 1. 7 mm lingular nodule is nonspecific. Recommend attention on follow-up. Otherwise, no evidence of metastatic disease in the chest. 2. Centrilobular and  paraseptal emphysema. 3.  Coronary artery atherosclerosis. Aortic atherosclerosis. 4. L2 lucent lesion is favored to represent a hemangioma. The L2 sclerotic lesion on the prior abdominal CT is less apparent today. Electronically Signed   By: Abigail Miyamoto M.D.   On: 03/10/2017 16:22   Nm Bone Scan Whole Body  Result Date: 03/10/2017 CLINICAL DATA:  71 year old male with hepatic metastases and gastric mass. Subtle sclerotic area within the L2 vertebral body on recent CT. EXAM: NUCLEAR MEDICINE WHOLE BODY BONE SCAN TECHNIQUE: Whole body anterior and posterior images were obtained approximately 3 hours after intravenous injection of radiopharmaceutical. RADIOPHARMACEUTICALS:  24.4 mCi Technetium-77mMDP IV COMPARISON:  03/09/2017 CT FINDINGS: No abnormal areas of bony activity are noted to suggest osseous metastatic disease. There is no evidence of abnormal activity within the L2 vertebral body. No significant abnormalities are noted. IMPRESSION: No evidence of bony metastatic disease. Electronically Signed   By: JMargarette CanadaM.D.   On: 03/10/2017 17:08   Ct Abdomen Pelvis W Contrast  Result Date: 03/09/2017 CLINICAL DATA:  Epigastric pain with significant weight loss. EXAM: CT ABDOMEN AND PELVIS WITH CONTRAST TECHNIQUE: Multidetector CT imaging of the abdomen and pelvis was performed using the standard protocol following bolus administration of intravenous contrast. CONTRAST:  1089mISOVUE-300 IOPAMIDOL (ISOVUE-300) INJECTION 61% COMPARISON:  None. FINDINGS: Lower chest:  Calcified granuloma in the left lower lobe. Hepatobiliary: Numerous hepatic masses which appears solid in heterogeneously enhancing, largest measuring 5 cm in segment 6. No background cirrhotic features.No evidence of biliary obstruction or stone. Pancreas: Negative for mass or ductal obstruction. Spleen: Unremarkable. Adrenals/Urinary Tract: Negative adrenals. No hydronephrosis or stone. Small renal cysts. Unremarkable bladder. Stomach/Bowel:  There is a mass at the gastric cardia projecting into the lumen, measuring at least 33 mm. Two enlarged nodes in the gastrohepatic ligament with appearance matching the liver lesions, 18 mm in maximal diameter. No bowel obstruction.  Extensive colonic diverticulosis. Vascular/Lymphatic: Severe atherosclerosis with short-segment left common iliac artery occlusion. Gastrohepatic ligament adenopathy as above. Reproductive:No pathologic findings. Other: No ascites or pneumoperitoneum. Musculoskeletal: No acute abnormalities. 1 mm sclerotic focus in the L2 vertebral body. IMPRESSION: 1. Findings of widespread hepatic metastatic disease. Gastric cardia mass and gastrohepatic ligament adenopathy implying gastric primary. 2. Attention on follow up to 1 cm sclerotic focus in L2 body. 3. Advanced atherosclerosis. Short-segment left common iliac occlusion. Electronically Signed   By: JoMonte Fantasia.D.   On: 03/09/2017 12:45     Assessment and plan- Patient is a [age].o. male with metastatic adenocarcinoma of GE junction/gastric cardia with liver metastases  Discussed Ct scan results and pathology results. I have personally reviewed the ct images independently as well. Explained to him that he has stage IV GE junction/ gastric cancer. Surgery and radiation not indicated at this time. Systemic chemotherapy would be the recoomended treatment option.Goal of chemotherapy would be palliative and not curative to shrink  the tumor, improve his QOL and longevity. I would recommend palliative chemotherapy with FOLFOX which would be 5FU with oxalipaltin and leucovorin IV Q2 weeks until progression or toxicity. Discussed risks and benefits of chemotherapy including all but not limited to fatigue, nausea, vomiting, low blood counts, risk of infection and mucositis. Risk of neuropathy associated with oxaliplatin. Patient understands and agrees to proceed as planned. We will plan to get port placement, he will attend chemo class and  tentatively start chemo in 7-10 days time. If her 2 testing is positive, will add trastuzumab Q2 weeks as wekk. Discussed risk of potential cardiotoxicity with trastuzumab  Iron deficiency anemia-  secondary to bleeding from tumor. Continue rpotonix. He received IV iron in the hospital. Will plan to give him feraheme along with chemo  Abnormal weight loss- secondary to malignancy. Encouraged ensure and nutritional supplements. Patient wants to hold off on seeing a dietician  Neoplasm related pain- controlled with hydrocodone. Refill given    Visit Diagnosis 1. 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 Liver metastases ([place])   3. Goals of care, counseling/discussion   4. Malignant neoplasm of stomach, unspecified location ([place])   5. Iron deficiency anemia due to chronic blood loss   6. Neoplasm related pain   7. Weight loss, unintentional      Dr. Randa Evens, MD, MPH [facility] at [facility] Pager- [phone] 03/19/2017 9:54 AM

## 2017-03-19 NOTE — Progress Notes (Signed)
Here for hospital follow up. Stated doing " better"

## 2017-03-20 ENCOUNTER — Other Ambulatory Visit (INDEPENDENT_AMBULATORY_CARE_PROVIDER_SITE_OTHER): Payer: Self-pay | Admitting: Vascular Surgery

## 2017-03-20 LAB — CEA: CEA: 2.5 ng/mL (ref 0.0–4.7)

## 2017-03-21 ENCOUNTER — Encounter
Admission: RE | Disposition: A | Discharge status: Home / Self Care | Payer: Self-pay | Source: Ambulatory Visit | Attending: Vascular Surgery

## 2017-03-21 ENCOUNTER — Ambulatory Visit
Admission: RE | Admit: 2017-03-21 | Discharge: 2017-03-21 | Disposition: A | Discharge status: Home / Self Care | Payer: PPO | Source: Ambulatory Visit | Attending: Vascular Surgery | Admitting: Vascular Surgery

## 2017-03-21 DIAGNOSIS — R634 Abnormal weight loss: Secondary | ICD-10-CM | POA: Diagnosis not present

## 2017-03-21 DIAGNOSIS — C787 Secondary malignant neoplasm of liver and intrahepatic bile duct: Secondary | ICD-10-CM | POA: Insufficient documentation

## 2017-03-21 DIAGNOSIS — D500 Iron deficiency anemia secondary to blood loss (chronic): Secondary | ICD-10-CM | POA: Diagnosis not present

## 2017-03-21 DIAGNOSIS — Z9889 Other specified postprocedural states: Secondary | ICD-10-CM | POA: Diagnosis not present

## 2017-03-21 DIAGNOSIS — J449 Chronic obstructive pulmonary disease, unspecified: Secondary | ICD-10-CM | POA: Diagnosis not present

## 2017-03-21 DIAGNOSIS — G893 Neoplasm related pain (acute) (chronic): Secondary | ICD-10-CM | POA: Insufficient documentation

## 2017-03-21 DIAGNOSIS — C159 Malignant neoplasm of esophagus, unspecified: Secondary | ICD-10-CM | POA: Diagnosis not present

## 2017-03-21 HISTORY — PX: PORTA CATH INSERTION: CATH118285

## 2017-03-21 SURGERY — PORTA CATH INSERTION
Anesthesia: Moderate Sedation

## 2017-03-21 MED ORDER — HYDROMORPHONE HCL 1 MG/ML IJ SOLN: 1.0000 mg | Freq: Once | INTRAMUSCULAR | Status: DC | PRN

## 2017-03-21 MED ORDER — CEFAZOLIN SODIUM-DEXTROSE 1-4 GM/50ML-% IV SOLN
1.0000 g | Freq: Once | INTRAVENOUS | Status: AC
  Administered 2017-03-21: 1 g via INTRAVENOUS

## 2017-03-21 MED ORDER — HEPARIN (PORCINE) IN NACL 2-0.9 UNIT/ML-% IJ SOLN
INTRAMUSCULAR | Status: AC
  Filled 2017-03-21: qty 500

## 2017-03-21 MED ORDER — CEFAZOLIN SODIUM-DEXTROSE 2-4 GM/100ML-% IV SOLN: 2.0000 g | Freq: Once | INTRAVENOUS | Status: DC

## 2017-03-21 MED ORDER — SODIUM CHLORIDE 0.9 % IV SOLN
INTRAVENOUS | Status: DC
  Administered 2017-03-21: 09:00:00 via INTRAVENOUS

## 2017-03-21 MED ORDER — GENTAMICIN SULFATE 40 MG/ML IJ SOLN
Freq: Once | INTRAMUSCULAR | Status: AC
  Administered 2017-03-21: 10:00:00
  Filled 2017-03-21: qty 2

## 2017-03-21 MED ORDER — FENTANYL CITRATE (PF) 100 MCG/2ML IJ SOLN
INTRAMUSCULAR | Status: AC
  Filled 2017-03-21: qty 2

## 2017-03-21 MED ORDER — MIDAZOLAM HCL 5 MG/5ML IJ SOLN
INTRAMUSCULAR | Status: AC
  Filled 2017-03-21: qty 5

## 2017-03-21 MED ORDER — FENTANYL CITRATE (PF) 100 MCG/2ML IJ SOLN
INTRAMUSCULAR | Status: DC | PRN
  Administered 2017-03-21: 50 ug via INTRAVENOUS

## 2017-03-21 MED ORDER — MIDAZOLAM HCL 2 MG/2ML IJ SOLN
INTRAMUSCULAR | Status: DC | PRN
  Administered 2017-03-21: 2 mg via INTRAVENOUS
  Administered 2017-03-21: 1 mg

## 2017-03-21 MED ORDER — LIDOCAINE-EPINEPHRINE (PF) 2 %-1:200000 IJ SOLN
INTRAMUSCULAR | Status: AC
  Filled 2017-03-21: qty 20

## 2017-03-21 MED ORDER — ONDANSETRON HCL 4 MG/2ML IJ SOLN: 4.0000 mg | Freq: Four times a day (QID) | INTRAMUSCULAR | Status: DC | PRN

## 2017-03-21 SURGICAL SUPPLY — 9 items
DERMABOND ADVANCED (GAUZE/BANDAGES/DRESSINGS) ×2
DERMABOND ADVANCED .7 DNX12 (GAUZE/BANDAGES/DRESSINGS) ×1 IMPLANT
KIT PORT POWER 8FR ISP CVUE (Catheter) ×3 IMPLANT
PACK ANGIOGRAPHY (CUSTOM PROCEDURE TRAY) ×3 IMPLANT
PAD GROUND ADULT SPLIT (MISCELLANEOUS) ×3 IMPLANT
PENCIL ELECTRO HAND CTR (MISCELLANEOUS) ×3 IMPLANT
SUT MNCRL AB 4-0 PS2 18 (SUTURE) ×3 IMPLANT
SUT PROLENE 0 CT 1 30 (SUTURE) ×3 IMPLANT
SUTURE VIC 3-0 (SUTURE) ×3 IMPLANT

## 2017-03-21 NOTE — Patient Instructions (Signed)

## 2017-03-21 NOTE — Op Note (Signed)
      [facility]       Operative Note  Date: 03/21/2017  Preoperative diagnosis:  1. Esophageal cancer  Postoperative diagnosis:  Same as above  Procedures: #1. Ultrasound guidance for vascular access to the right internal jugular vein. #2. Fluoroscopic guidance for placement of catheter. #3. Placement of CT compatible Port-A-Cath, right internal jugular vein.  Surgeon: Leotis Pain, MD.   Anesthesia: Local with moderate conscious sedation for approximately 20  minutes using 3 mg of Versed and 50 mcg of Fentanyl  Fluoroscopy time: less than 1 minute  Contrast used: 0  Estimated blood loss: minimal  Indication for the procedure:  The patient is a 71 y.o.male with esophageal cancer.  The patient needs a Port-A-Cath for durable venous access, chemotherapy, lab draws, and CT scans. We are asked to place this. Risks and benefits were discussed and informed consent was obtained.  Description of procedure: The patient was brought to the vascular and interventional radiology suite.  Moderate conscious sedation was administered throughout the procedure during a face to face encounter with the patient with my supervision of the RN administering medicines and monitoring the patient's vital signs, pulse oximetry, telemetry and mental status throughout from the start of the procedure until the patient was taken to the recovery room. The right neck chest and shoulder were sterilely prepped and draped, and a sterile surgical field was created. Ultrasound was used to help visualize a patent right internal jugular vein. This was then accessed under direct ultrasound guidance without difficulty with the Seldinger needle and a permanent image was recorded. A J-wire was placed. After skin nick and dilatation, the peel-away sheath was then placed over the wire. I then anesthetized an area under the clavicle approximately 1-2 fingerbreadths. A transverse incision was created and an inferior  pocket was created with electrocautery and blunt dissection. The port was then brought onto the field, placed into the pocket and secured to the chest wall with 2 Prolene sutures. The catheter was connected to the port and tunneled from the subclavicular incision to the access site. Fluoroscopic guidance was then used to cut the catheter to an appropriate length. The catheter was then placed through the peel-away sheath and the peel-away sheath was removed. The catheter tip was parked in excellent location under fluorocoscopic guidance in the SVC just above the right atrium. The pocket was then irrigated with antibiotic impregnated saline and the wound was closed with a running 3-0 Vicryl and a 4-0 Monocryl. The access incision was closed with a single 4-0 Monocryl. The Huber needle was used to withdraw blood and flush the port with heparinized saline. Dermabond was then placed as a dressing. The patient tolerated the procedure well and was taken to the recovery room in stable condition.   Leotis Pain 03/21/2017 10:16 AM   This note was created with Dragon Medical transcription system. Any errors in dictation are purely unintentional.

## 2017-03-21 NOTE — H&P (Signed)
[facility] History & Physical Update  The patient was interviewed and re-examined.  The patient's previous History and Physical has been reviewed and is unchanged.  There is no change in the plan of care. We plan to proceed with the scheduled procedure.  Leotis Pain, MD  03/21/2017, 8:23 AM

## 2017-03-22 ENCOUNTER — Inpatient Hospital Stay: Payer: PPO

## 2017-03-22 ENCOUNTER — Encounter: Payer: Self-pay | Admitting: Oncology

## 2017-03-23 ENCOUNTER — Encounter: Payer: Self-pay | Admitting: Oncology

## 2017-03-23 LAB — SURGICAL PATHOLOGY

## 2017-03-27 ENCOUNTER — Encounter: Payer: Self-pay | Admitting: Oncology

## 2017-03-27 ENCOUNTER — Inpatient Hospital Stay: Payer: PPO | Admitting: *Deleted

## 2017-03-27 ENCOUNTER — Inpatient Hospital Stay: Payer: PPO

## 2017-03-27 ENCOUNTER — Inpatient Hospital Stay (HOSPITAL_BASED_OUTPATIENT_CLINIC_OR_DEPARTMENT_OTHER): Payer: PPO | Admitting: Oncology

## 2017-03-27 VITALS — BP 146/78 | HR 80 | Temp 96.0°F | Resp 18 | Wt 113.6 lb

## 2017-03-27 DIAGNOSIS — C159 Malignant neoplasm of esophagus, unspecified: Secondary | ICD-10-CM

## 2017-03-27 DIAGNOSIS — R64 Cachexia: Secondary | ICD-10-CM

## 2017-03-27 DIAGNOSIS — Z87891 Personal history of nicotine dependence: Secondary | ICD-10-CM

## 2017-03-27 DIAGNOSIS — C160 Malignant neoplasm of cardia: Secondary | ICD-10-CM

## 2017-03-27 DIAGNOSIS — D500 Iron deficiency anemia secondary to blood loss (chronic): Secondary | ICD-10-CM

## 2017-03-27 DIAGNOSIS — D509 Iron deficiency anemia, unspecified: Secondary | ICD-10-CM

## 2017-03-27 DIAGNOSIS — E46 Unspecified protein-calorie malnutrition: Secondary | ICD-10-CM | POA: Diagnosis not present

## 2017-03-27 DIAGNOSIS — J449 Chronic obstructive pulmonary disease, unspecified: Secondary | ICD-10-CM

## 2017-03-27 DIAGNOSIS — M899 Disorder of bone, unspecified: Secondary | ICD-10-CM

## 2017-03-27 DIAGNOSIS — R634 Abnormal weight loss: Secondary | ICD-10-CM | POA: Diagnosis not present

## 2017-03-27 DIAGNOSIS — G893 Neoplasm related pain (acute) (chronic): Secondary | ICD-10-CM | POA: Diagnosis not present

## 2017-03-27 DIAGNOSIS — R918 Other nonspecific abnormal finding of lung field: Secondary | ICD-10-CM | POA: Diagnosis not present

## 2017-03-27 DIAGNOSIS — C787 Secondary malignant neoplasm of liver and intrahepatic bile duct: Secondary | ICD-10-CM | POA: Diagnosis not present

## 2017-03-27 DIAGNOSIS — I700 Atherosclerosis of aorta: Secondary | ICD-10-CM

## 2017-03-27 DIAGNOSIS — E43 Unspecified severe protein-calorie malnutrition: Secondary | ICD-10-CM

## 2017-03-27 DIAGNOSIS — R109 Unspecified abdominal pain: Secondary | ICD-10-CM | POA: Diagnosis not present

## 2017-03-27 DIAGNOSIS — C799 Secondary malignant neoplasm of unspecified site: Principal | ICD-10-CM

## 2017-03-27 DIAGNOSIS — C772 Secondary and unspecified malignant neoplasm of intra-abdominal lymph nodes: Secondary | ICD-10-CM

## 2017-03-27 DIAGNOSIS — Z5111 Encounter for antineoplastic chemotherapy: Secondary | ICD-10-CM | POA: Diagnosis not present

## 2017-03-27 DIAGNOSIS — Z7189 Other specified counseling: Secondary | ICD-10-CM | POA: Insufficient documentation

## 2017-03-27 LAB — CBC WITH DIFFERENTIAL/PLATELET
BASOS ABS: 0.1 10*3/uL (ref 0–0.1)
BASOS PCT: 1 %
EOS ABS: 0.4 10*3/uL (ref 0–0.7)
Eosinophils Relative: 4 %
HEMATOCRIT: 25.1 % — AB (ref 40.0–52.0)
Hemoglobin: 8.4 g/dL — ABNORMAL LOW (ref 13.0–18.0)
Lymphocytes Relative: 10 %
Lymphs Abs: 1 10*3/uL (ref 1.0–3.6)
MCH: 25.9 pg — ABNORMAL LOW (ref 26.0–34.0)
MCHC: 33.3 g/dL (ref 32.0–36.0)
MCV: 77.9 fL — ABNORMAL LOW (ref 80.0–100.0)
MONO ABS: 0.9 10*3/uL (ref 0.2–1.0)
MONOS PCT: 10 %
Neutro Abs: 6.9 10*3/uL — ABNORMAL HIGH (ref 1.4–6.5)
Neutrophils Relative %: 75 %
PLATELETS: 405 10*3/uL (ref 150–440)
RBC: 3.22 MIL/uL — ABNORMAL LOW (ref 4.40–5.90)
RDW: 22.6 % — AB (ref 11.5–14.5)
WBC: 9.2 10*3/uL (ref 3.8–10.6)

## 2017-03-27 LAB — COMPREHENSIVE METABOLIC PANEL
ALBUMIN: 3.2 g/dL — AB (ref 3.5–5.0)
ALT: 19 U/L (ref 17–63)
ANION GAP: 8 (ref 5–15)
AST: 24 U/L (ref 15–41)
Alkaline Phosphatase: 105 U/L (ref 38–126)
BUN: 17 mg/dL (ref 6–20)
CHLORIDE: 97 mmol/L — AB (ref 101–111)
CO2: 28 mmol/L (ref 22–32)
Calcium: 8.8 mg/dL — ABNORMAL LOW (ref 8.9–10.3)
Creatinine, Ser: 0.54 mg/dL — ABNORMAL LOW (ref 0.61–1.24)
GFR calc Af Amer: >60 mL/min (ref >60)
GFR calc non Af Amer: >60 mL/min (ref >60)
GLUCOSE: 91 mg/dL (ref 65–99)
POTASSIUM: 4.3 mmol/L (ref 3.5–5.1)
SODIUM: 133 mmol/L — AB (ref 135–145)
TOTAL PROTEIN: 6.1 g/dL — AB (ref 6.5–8.1)
Total Bilirubin: 0.4 mg/dL (ref 0.3–1.2)

## 2017-03-27 MED ORDER — SODIUM CHLORIDE 0.9 % IV SOLN
510.0000 mg | Freq: Once | INTRAVENOUS | Status: AC
  Administered 2017-03-27: 510 mg via INTRAVENOUS
  Filled 2017-03-27: qty 17

## 2017-03-27 MED ORDER — SODIUM CHLORIDE 0.9 % IV SOLN
2400.0000 mg/m2 | INTRAVENOUS | Status: DC
  Administered 2017-03-27: 3900 mg via INTRAVENOUS
  Filled 2017-03-27: qty 78

## 2017-03-27 MED ORDER — OXALIPLATIN CHEMO INJECTION 100 MG/20ML
85.0000 mg/m2 | Freq: Once | INTRAVENOUS | Status: AC
  Administered 2017-03-27: 140 mg via INTRAVENOUS
  Filled 2017-03-27: qty 20

## 2017-03-27 MED ORDER — SODIUM CHLORIDE 0.9 % IV SOLN
Freq: Once | INTRAVENOUS | Status: AC
  Administered 2017-03-27: 11:00:00 via INTRAVENOUS
  Filled 2017-03-27: qty 1000

## 2017-03-27 MED ORDER — LEUCOVORIN CALCIUM INJECTION 350 MG
650.0000 mg | Freq: Once | INTRAVENOUS | Status: AC
  Administered 2017-03-27: 650 mg via INTRAVENOUS
  Filled 2017-03-27: qty 32.5

## 2017-03-27 MED ORDER — DEXTROSE 5 % IV SOLN
Freq: Once | INTRAVENOUS | Status: AC
  Administered 2017-03-27: 12:00:00 via INTRAVENOUS
  Filled 2017-03-27: qty 1000

## 2017-03-27 MED ORDER — FLUOROURACIL CHEMO INJECTION 2.5 GM/50ML
400.0000 mg/m2 | Freq: Once | INTRAVENOUS | Status: AC
  Administered 2017-03-27: 650 mg via INTRAVENOUS
  Filled 2017-03-27: qty 13

## 2017-03-27 MED ORDER — SODIUM CHLORIDE 0.9 % IV SOLN: 10.0000 mg | Freq: Once | INTRAVENOUS | Status: DC

## 2017-03-27 MED ORDER — DEXAMETHASONE SODIUM PHOSPHATE 10 MG/ML IJ SOLN
10.0000 mg | Freq: Once | INTRAMUSCULAR | Status: AC
  Administered 2017-03-27: 10 mg via INTRAVENOUS
  Filled 2017-03-27: qty 1

## 2017-03-27 MED ORDER — PALONOSETRON HCL INJECTION 0.25 MG/5ML
0.2500 mg | Freq: Once | INTRAVENOUS | Status: AC
  Administered 2017-03-27: 0.25 mg via INTRAVENOUS
  Filled 2017-03-27: qty 5

## 2017-03-27 NOTE — Progress Notes (Signed)
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  He has been havving dark tarry stools for about 3 weeks prior to that. He is also had a significant weight loss of about 10 poundsover the last few months. CT abdomen done in the ER showed multiple liver metastases as well as a mass in the gastric cardia projecting into the lumen as well as adenopathy in the gastrohepatic ligament  EGD showed: Large fungating mass with bleeding at the GE junction 48 cm from the incisors. Mass was partially obstructing and partially circumferential involving one half of the lumen. Large fungating infiltrative and ulcerated and partially circumferential mass involving one half of the lumen circumference bleeding and stigmata of recent bleeding also found in the gastric cardia  Stomach mass cardia biopsy showed poorly differentiated adenocarcinoma. Her 2 neu negative and PDL1 was expressed  CT thorax showed 7 mm lingular nodule. No evidence of bone mets on bone scan. L2 lucent lesion likely hemangioma   Interval history- feels fatigued but continues to be active. He is eating better. Pain controlled with hydrocodone. No nausea. Occasional black stools  ECOG PS- 1 Pain scale- 0 Opioid associated constipation- no  Review of systems- Review of Systems  Constitutional: Positive for malaise/fatigue. Negative for chills, fever and weight loss.  HENT: Negative for congestion, ear  discharge and nosebleeds.   Eyes: Negative for blurred vision.  Respiratory: Negative for cough, hemoptysis, sputum production, shortness of breath and wheezing.   Cardiovascular: Negative for chest pain, palpitations, orthopnea and claudication.  Gastrointestinal: Negative for abdominal pain, blood in stool, constipation, diarrhea, heartburn, melena, nausea and vomiting.  Genitourinary: Negative for dysuria, flank pain, frequency, hematuria and urgency.  Musculoskeletal: Negative for back pain, joint pain and myalgias.  Skin: Negative for rash.  Neurological: Negative for dizziness, tingling, focal weakness, seizures, weakness and headaches.  Endo/Heme/Allergies: Does not bruise/bleed easily.  Psychiatric/Behavioral: Negative for depression and suicidal ideas. The patient does not have insomnia.        No Known Allergies   Past Medical History:  Diagnosis Date  . COPD (chronic obstructive pulmonary disease) ([place])      Past Surgical History:  Procedure Laterality Date  . ESOPHAGOGASTRODUODENOSCOPY (EGD) WITH PROPOFOL N/A 03/10/2017   Procedure: ESOPHAGOGASTRODUODENOSCOPY (EGD) WITH PROPOFOL;  Surgeon: Wilford Corner, MD;  Location: [facility] ENDOSCOPY;  Service: Endoscopy;  Laterality: N/A;  . KNEE ARTHROSCOPY  1991  . PORTA CATH INSERTION N/A 03/21/2017   Procedure: Glori Luis Cath Insertion;  Surgeon: Algernon Huxley, MD;  Location: [place] CV LAB;  Service: Cardiovascular;  Laterality: N/A;  . TONSILLECTOMY      Social History   Social History  . Marital status: Married    Spouse name: N/A  . Number of children: N/A  . Years of education: N/A   Occupational History  . Not on file.   Social History Main Topics  . Smoking status: Former Smoker    Quit date: 06/2010  . Smokeless tobacco: Never Used  . Alcohol use No  .  Drug use: No  . Sexual activity: Not on file   Other Topics Concern  . Not on file   Social History Narrative  . No narrative on file    No family  history on file.   Current Outpatient Prescriptions:  .  dexamethasone (DECADRON) 4 MG tablet, Take 2 tablets (8 mg total) by mouth daily. Start the day after chemotherapy for 2 days. Take with food., Disp: 30 tablet, Rfl: 1 .  HYDROcodone-acetaminophen (NORCO/VICODIN) 5-325 MG tablet, Take 1 tablet by mouth every 4 (four) hours as needed for moderate pain or severe pain., Disp: 60 tablet, Rfl: 0 .  lidocaine-prilocaine (EMLA) cream, Apply to affected area once, Disp: 30 g, Rfl: 3 .  LORazepam (ATIVAN) 0.5 MG tablet, Take 1 tablet (0.5 mg total) by mouth every 6 (six) hours as needed (Nausea or vomiting)., Disp: 30 tablet, Rfl: 0 .  ondansetron (ZOFRAN) 4 MG tablet, Take 1 tablet (4 mg total) by mouth every 6 (six) hours as needed for nausea., Disp: 20 tablet, Rfl: 0 .  ondansetron (ZOFRAN) 8 MG tablet, Take 1 tablet (8 mg total) by mouth 2 (two) times daily as needed for refractory nausea / vomiting. Start on day 3 after chemotherapy., Disp: 30 tablet, Rfl: 1 .  pantoprazole (PROTONIX) 40 MG tablet, Take 1 tablet (40 mg total) by mouth 2 (two) times daily., Disp: 60 tablet, Rfl: 0 .  prochlorperazine (COMPAZINE) 10 MG tablet, Take 1 tablet (10 mg total) by mouth every 6 (six) hours as needed (Nausea or vomiting)., Disp: 30 tablet, Rfl: 1 .  promethazine (PHENERGAN) 12.5 MG tablet, Take 12.5 mg by mouth 4 (four) times daily., Disp: , Rfl: 0 No current facility-administered medications for this visit.   Facility-Administered Medications Ordered in Other Visits:  .  dextrose 5 % solution, , Intravenous, Once, Sindy Guadeloupe, MD .  ferumoxytol [facility]) 510 mg in sodium chloride 0.9 % 100 mL IVPB, 510 mg, Intravenous, Once, Sindy Guadeloupe, MD .  fluorouracil (ADRUCIL) 3,900 mg in sodium chloride 0.9 % 72 mL chemo infusion, 2,400 mg/m2 (Treatment Plan Recorded), Intravenous, 1 day or 1 dose, Sindy Guadeloupe, MD .  fluorouracil (ADRUCIL) chemo injection 650 mg, 400 mg/m2 (Treatment Plan Recorded),  Intravenous, Once, Sindy Guadeloupe, MD .  leucovorin 650 mg in dextrose 5 % 250 mL infusion, 650 mg, Intravenous, Once, Sindy Guadeloupe, MD, Last Rate: 141 mL/hr at 03/27/17 1143, 650 mg at 03/27/17 1143 .  oxaliplatin (ELOXATIN) 140 mg in dextrose 5 % 500 mL chemo infusion, 85 mg/m2 (Treatment Plan Recorded), Intravenous, Once, Sindy Guadeloupe, MD, Last Rate: 264 mL/hr at 03/27/17 1143, 140 mg at 03/27/17 1143  Physical exam:  Vitals:   03/27/17 1028  BP: (!) 146/78  Pulse: 80  Resp: 18  Temp: (!) 96 F (35.6 C)  TempSrc: Tympanic  Weight: 113 lb 9 oz (51.5 kg)   Physical Exam  Constitutional: He is oriented to person, place, and time.  Thin and cachectic. Appears in no acute distress  HENT:  Head: Normocephalic and atraumatic.  Eyes: EOM are normal. Pupils are equal, round, and reactive to light.  Neck: Normal range of motion.  Cardiovascular: Normal rate, regular rhythm and normal heart sounds.   Pulmonary/Chest: Effort normal and breath sounds normal.  Chest wall port in place. Port site does not appear infected  Abdominal: Soft. Bowel sounds are normal.  Neurological: He is alert and oriented to person, place, and time.  Skin: Skin is warm  and dry.     CMP Latest Ref Rng & Units 03/27/2017  Glucose 65 - 99 mg/dL 91  BUN 6 - 20 mg/dL 17  Creatinine 0.61 - 1.24 mg/dL 0.54(L)  Sodium 135 - 145 mmol/L 133(L)  Potassium 3.5 - 5.1 mmol/L 4.3  Chloride 101 - 111 mmol/L 97(L)  CO2 22 - 32 mmol/L 28  Calcium 8.9 - 10.3 mg/dL 8.8(L)  Total Protein 6.5 - 8.1 g/dL 6.1(L)  Total Bilirubin 0.3 - 1.2 mg/dL 0.4  Alkaline Phos 38 - 126 U/L 105  AST 15 - 41 U/L 24  ALT 17 - 63 U/L 19   CBC Latest Ref Rng & Units 03/27/2017  WBC 3.8 - 10.6 K/uL 9.2  Hemoglobin 13.0 - 18.0 g/dL 8.4(L)  Hematocrit 40.0 - 52.0 % 25.1(L)  Platelets 150 - 440 K/uL 405    No images are attached to the encounter.  Dg Chest 2 View  Result Date: 03/09/2017 CLINICAL DATA:  Nausea and epigastric pain over  the last 10 days. EXAM: CHEST  2 VIEW COMPARISON:  None. FINDINGS: Heart size is normal. There is aortic atherosclerosis. Lungs are hyperinflated with likely emphysema. The lungs do not show any focal infiltrate, mass or volume loss. Snap artifacts overlie the chest. No effusions. No significant bone finding. IMPRESSION: No active disease.  Aortic atherosclerosis.  Probable emphysema. Electronically Signed   By: Nelson Chimes M.D.   On: 03/09/2017 11:27   Ct Chest W Contrast  Result Date: 03/10/2017 CLINICAL DATA:  Gastric cancer.  Staging.  COPD. EXAM: CT CHEST WITH CONTRAST TECHNIQUE: Multidetector CT imaging of the chest was performed during intravenous contrast administration. CONTRAST:  30m ISOVUE-300 IOPAMIDOL (ISOVUE-300) INJECTION 61% COMPARISON:  Abdominal CT of 03/09/2017. Chest radiograph 03/09/2017. FINDINGS: Cardiovascular: Aortic and branch vessel atherosclerosis. Normal heart size, without pericardial effusion. Multivessel coronary artery atherosclerosis. No central pulmonary embolism, on this non-dedicated study. Mediastinum/Nodes: No supraclavicular adenopathy. No mediastinal or hilar adenopathy. Lungs/Pleura: No pleural fluid. Moderate centrilobular and paraseptal emphysema. Biapical pleural-parenchymal scarring. Mild left base scarring or atelectasis medially. Scattered calcified granulomas. Lingular nodule measures 7 mm on image 134/series 3. Upper Abdomen: Extensive hepatic and abdominal nodal metastasis, as detailed on the dedicated CT. Gastric cardia lesion again identified. Abdominal aortic atherosclerosis. Musculoskeletal: Lucent lesion of 10 mm within the superior aspect of the L2 vertebral body. IMPRESSION: 1. 7 mm lingular nodule is nonspecific. Recommend attention on follow-up. Otherwise, no evidence of metastatic disease in the chest. 2. Centrilobular and paraseptal emphysema. 3.  Coronary artery atherosclerosis. Aortic atherosclerosis. 4. L2 lucent lesion is favored to represent a  hemangioma. The L2 sclerotic lesion on the prior abdominal CT is less apparent today. Electronically Signed   By: KAbigail MiyamotoM.D.   On: 03/10/2017 16:22   Nm Bone Scan Whole Body  Result Date: 03/10/2017 CLINICAL DATA:  71year old male with hepatic metastases and gastric mass. Subtle sclerotic area within the L2 vertebral body on recent CT. EXAM: NUCLEAR MEDICINE WHOLE BODY BONE SCAN TECHNIQUE: Whole body anterior and posterior images were obtained approximately 3 hours after intravenous injection of radiopharmaceutical. RADIOPHARMACEUTICALS:  24.4 mCi Technetium-964mDP IV COMPARISON:  03/09/2017 CT FINDINGS: No abnormal areas of bony activity are noted to suggest osseous metastatic disease. There is no evidence of abnormal activity within the L2 vertebral body. No significant abnormalities are noted. IMPRESSION: No evidence of bony metastatic disease. Electronically Signed   By: JeMargarette Canada.D.   On: 03/10/2017 17:08   Ct Abdomen Pelvis W Contrast  Result Date: 03/09/2017 CLINICAL DATA:  Epigastric pain with significant weight loss. EXAM: CT ABDOMEN AND PELVIS WITH CONTRAST TECHNIQUE: Multidetector CT imaging of the abdomen and pelvis was performed using the standard protocol following bolus administration of intravenous contrast. CONTRAST:  161m ISOVUE-300 IOPAMIDOL (ISOVUE-300) INJECTION 61% COMPARISON:  None. FINDINGS: Lower chest:  Calcified granuloma in the left lower lobe. Hepatobiliary: Numerous hepatic masses which appears solid in heterogeneously enhancing, largest measuring 5 cm in segment 6. No background cirrhotic features.No evidence of biliary obstruction or stone. Pancreas: Negative for mass or ductal obstruction. Spleen: Unremarkable. Adrenals/Urinary Tract: Negative adrenals. No hydronephrosis or stone. Small renal cysts. Unremarkable bladder. Stomach/Bowel: There is a mass at the gastric cardia projecting into the lumen, measuring at least 33 mm. Two enlarged nodes in the gastrohepatic  ligament with appearance matching the liver lesions, 18 mm in maximal diameter. No bowel obstruction.  Extensive colonic diverticulosis. Vascular/Lymphatic: Severe atherosclerosis with short-segment left common iliac artery occlusion. Gastrohepatic ligament adenopathy as above. Reproductive:No pathologic findings. Other: No ascites or pneumoperitoneum. Musculoskeletal: No acute abnormalities. 1 mm sclerotic focus in the L2 vertebral body. IMPRESSION: 1. Findings of widespread hepatic metastatic disease. Gastric cardia mass and gastrohepatic ligament adenopathy implying gastric primary. 2. Attention on follow up to 1 cm sclerotic focus in L2 body. 3. Advanced atherosclerosis. Short-segment left common iliac occlusion. Electronically Signed   By: JMonte FantasiaM.D.   On: 03/09/2017 12:45     Assessment and plan- Patient is a 71y.o. male with Stage IV GE junction adenocarcinoma with liver metastases  1. Counts ok to proceed with cycle 1 of FOLFOX. Her 2 testing was negative and therefore patient is not a candidate for trastuzumab. His tumor did show PDL1 expression. Palliative kBeryle Flockcould be considered down the line. Patient understands palliative nature of chemo. Baseline CEA normal  2. Iron deficiency anemia- due to upper GI bleeding from tumor. Patient does not wish to take PO iron. He will receive feraheme dose 1 today. Dose 2 with next cycle. Continue protonix  3. Neoplasm related pain- continue hydrocodone prn  4. Cancer associated cachexia and malnutrition- patient does not wish to take ensure. PO intake improving. He declines nutrition evaluation  rtc in 2 weeks with cbc, cmp for cycle 2 of FOLFOX     Visit Diagnosis 1. Esophageal cancer, stage IV ([place]   2. Iron deficiency anemia due to chronic blood loss   3. Liver metastases ([place]   4. Goals of care, counseling/discussion   5. Neoplasm related pain   6. Severe protein-calorie malnutrition ([place]      Dr. ARanda Evens MD,  MP[facility] HospitalPager- [phone]/26/2018 12:37 PM

## 2017-03-27 NOTE — Progress Notes (Signed)
Patient offers no complaints today, other than being SOB when it is time for his pain medication.

## 2017-03-29 ENCOUNTER — Inpatient Hospital Stay: Payer: PPO

## 2017-03-29 VITALS — BP 160/70 | HR 70 | Temp 97.0°F | Resp 18

## 2017-03-29 DIAGNOSIS — Z5111 Encounter for antineoplastic chemotherapy: Secondary | ICD-10-CM | POA: Diagnosis not present

## 2017-03-29 DIAGNOSIS — C159 Malignant neoplasm of esophagus, unspecified: Secondary | ICD-10-CM

## 2017-03-29 DIAGNOSIS — C799 Secondary malignant neoplasm of unspecified site: Principal | ICD-10-CM

## 2017-03-29 MED ORDER — HEPARIN SOD (PORK) LOCK FLUSH 100 UNIT/ML IV SOLN
500.0000 [IU] | Freq: Once | INTRAVENOUS | Status: AC | PRN
Start: 2017-03-29 — End: 2017-03-29
  Administered 2017-03-29: 500 [IU]

## 2017-03-29 MED ORDER — HEPARIN SOD (PORK) LOCK FLUSH 100 UNIT/ML IV SOLN
INTRAVENOUS | Status: AC
  Filled 2017-03-29: qty 5

## 2017-03-29 MED ORDER — SODIUM CHLORIDE 0.9% FLUSH
10.0000 mL | INTRAVENOUS | Status: DC | PRN
  Administered 2017-03-29: 10 mL
  Filled 2017-03-29: qty 10

## 2017-04-05 ENCOUNTER — Telehealth: Payer: Self-pay | Admitting: *Deleted

## 2017-04-05 ENCOUNTER — Other Ambulatory Visit: Payer: Self-pay | Admitting: *Deleted

## 2017-04-05 DIAGNOSIS — C169 Malignant neoplasm of stomach, unspecified: Secondary | ICD-10-CM

## 2017-04-05 DIAGNOSIS — D500 Iron deficiency anemia secondary to blood loss (chronic): Secondary | ICD-10-CM

## 2017-04-05 DIAGNOSIS — Z0000 Encounter for general adult medical examination without abnormal findings: Secondary | ICD-10-CM | POA: Diagnosis not present

## 2017-04-05 MED ORDER — HYDROCODONE-ACETAMINOPHEN 5-325 MG PO TABS: 1.0000 | ORAL_TABLET | ORAL | 0 refills | Status: DC | PRN

## 2017-04-05 NOTE — Telephone Encounter (Signed)
Patient's wife, Larry Wood called to request copy of pathology report for insurance company.  Informed her that she will need to come by and ask for medical records, sign a release and they can get her a copy.  Verbalized understanding.

## 2017-04-09 ENCOUNTER — Other Ambulatory Visit: Payer: Self-pay | Admitting: *Deleted

## 2017-04-09 ENCOUNTER — Telehealth: Payer: Self-pay | Admitting: *Deleted

## 2017-04-09 DIAGNOSIS — C159 Malignant neoplasm of esophagus, unspecified: Secondary | ICD-10-CM

## 2017-04-09 DIAGNOSIS — C799 Secondary malignant neoplasm of unspecified site: Principal | ICD-10-CM

## 2017-04-09 MED ORDER — PANTOPRAZOLE SODIUM 40 MG PO TBEC: 40.0000 mg | DELAYED_RELEASE_TABLET | Freq: Two times a day (BID) | ORAL | 0 refills | Status: DC

## 2017-04-09 MED ORDER — PROCHLORPERAZINE MALEATE 10 MG PO TABS: 10.0000 mg | ORAL_TABLET | Freq: Four times a day (QID) | ORAL | 1 refills | Status: DC | PRN

## 2017-04-09 MED ORDER — ONDANSETRON HCL 8 MG PO TABS: 8.0000 mg | ORAL_TABLET | Freq: Two times a day (BID) | ORAL | 1 refills | Status: DC | PRN

## 2017-04-09 NOTE — Telephone Encounter (Signed)
Called patient and discussed that refills for nausea medicines and protonix were sent to pharmacy per orders. Voiced understanding.

## 2017-04-12 ENCOUNTER — Other Ambulatory Visit: Payer: Self-pay | Admitting: *Deleted

## 2017-04-12 ENCOUNTER — Inpatient Hospital Stay: Payer: PPO

## 2017-04-12 ENCOUNTER — Inpatient Hospital Stay: Payer: PPO | Attending: Oncology

## 2017-04-12 ENCOUNTER — Encounter: Payer: Self-pay | Admitting: Oncology

## 2017-04-12 ENCOUNTER — Telehealth: Payer: Self-pay | Admitting: Oncology

## 2017-04-12 ENCOUNTER — Inpatient Hospital Stay (HOSPITAL_BASED_OUTPATIENT_CLINIC_OR_DEPARTMENT_OTHER): Payer: PPO | Admitting: Oncology

## 2017-04-12 DIAGNOSIS — F17210 Nicotine dependence, cigarettes, uncomplicated: Secondary | ICD-10-CM | POA: Diagnosis not present

## 2017-04-12 DIAGNOSIS — C159 Malignant neoplasm of esophagus, unspecified: Secondary | ICD-10-CM

## 2017-04-12 DIAGNOSIS — M7989 Other specified soft tissue disorders: Secondary | ICD-10-CM

## 2017-04-12 DIAGNOSIS — R64 Cachexia: Secondary | ICD-10-CM | POA: Insufficient documentation

## 2017-04-12 DIAGNOSIS — J449 Chronic obstructive pulmonary disease, unspecified: Secondary | ICD-10-CM | POA: Diagnosis not present

## 2017-04-12 DIAGNOSIS — C169 Malignant neoplasm of stomach, unspecified: Secondary | ICD-10-CM

## 2017-04-12 DIAGNOSIS — C787 Secondary malignant neoplasm of liver and intrahepatic bile duct: Secondary | ICD-10-CM | POA: Diagnosis not present

## 2017-04-12 DIAGNOSIS — G893 Neoplasm related pain (acute) (chronic): Secondary | ICD-10-CM | POA: Insufficient documentation

## 2017-04-12 DIAGNOSIS — E46 Unspecified protein-calorie malnutrition: Secondary | ICD-10-CM

## 2017-04-12 DIAGNOSIS — Z5111 Encounter for antineoplastic chemotherapy: Secondary | ICD-10-CM | POA: Diagnosis not present

## 2017-04-12 DIAGNOSIS — Z79899 Other long term (current) drug therapy: Secondary | ICD-10-CM | POA: Insufficient documentation

## 2017-04-12 DIAGNOSIS — D500 Iron deficiency anemia secondary to blood loss (chronic): Secondary | ICD-10-CM | POA: Insufficient documentation

## 2017-04-12 DIAGNOSIS — C160 Malignant neoplasm of cardia: Secondary | ICD-10-CM | POA: Diagnosis not present

## 2017-04-12 DIAGNOSIS — C799 Secondary malignant neoplasm of unspecified site: Secondary | ICD-10-CM

## 2017-04-12 LAB — COMPREHENSIVE METABOLIC PANEL
ALBUMIN: 3.1 g/dL — AB (ref 3.5–5.0)
ALT: 15 U/L — ABNORMAL LOW (ref 17–63)
ANION GAP: 7 (ref 5–15)
AST: 21 U/L (ref 15–41)
Alkaline Phosphatase: 120 U/L (ref 38–126)
BUN: 15 mg/dL (ref 6–20)
CHLORIDE: 98 mmol/L — AB (ref 101–111)
CO2: 28 mmol/L (ref 22–32)
Calcium: 8.9 mg/dL (ref 8.9–10.3)
Creatinine, Ser: 0.55 mg/dL — ABNORMAL LOW (ref 0.61–1.24)
GFR calc Af Amer: >60 mL/min (ref >60)
GFR calc non Af Amer: >60 mL/min (ref >60)
GLUCOSE: 97 mg/dL (ref 65–99)
POTASSIUM: 4.2 mmol/L (ref 3.5–5.1)
SODIUM: 133 mmol/L — AB (ref 135–145)
Total Bilirubin: 0.4 mg/dL (ref 0.3–1.2)
Total Protein: 5.4 g/dL — ABNORMAL LOW (ref 6.5–8.1)

## 2017-04-12 LAB — CBC WITH DIFFERENTIAL/PLATELET
BASOS ABS: 0.1 10*3/uL (ref 0–0.1)
BASOS PCT: 1 %
EOS ABS: 0.2 10*3/uL (ref 0–0.7)
EOS PCT: 2 %
HCT: 28.1 % — ABNORMAL LOW (ref 40.0–52.0)
Hemoglobin: 9.4 g/dL — ABNORMAL LOW (ref 13.0–18.0)
Lymphocytes Relative: 11 %
Lymphs Abs: 0.9 10*3/uL — ABNORMAL LOW (ref 1.0–3.6)
MCH: 27 pg (ref 26.0–34.0)
MCHC: 33.5 g/dL (ref 32.0–36.0)
MCV: 80.6 fL (ref 80.0–100.0)
MONO ABS: 1.2 10*3/uL — AB (ref 0.2–1.0)
Monocytes Relative: 14 %
NEUTROS ABS: 5.9 10*3/uL (ref 1.4–6.5)
Neutrophils Relative %: 72 %
PLATELETS: 341 10*3/uL (ref 150–440)
RBC: 3.48 MIL/uL — ABNORMAL LOW (ref 4.40–5.90)
RDW: 21.2 % — AB (ref 11.5–14.5)
WBC: 8.2 10*3/uL (ref 3.8–10.6)

## 2017-04-12 LAB — MAGNESIUM: MAGNESIUM: 1.7 mg/dL (ref 1.7–2.4)

## 2017-04-12 MED ORDER — SODIUM CHLORIDE 0.9 % IV SOLN
510.0000 mg | Freq: Once | INTRAVENOUS | Status: AC
  Administered 2017-04-12: 510 mg via INTRAVENOUS
  Filled 2017-04-12: qty 17

## 2017-04-12 MED ORDER — SODIUM CHLORIDE 0.9 % IV SOLN: 10.0000 mg | Freq: Once | INTRAVENOUS | Status: DC

## 2017-04-12 MED ORDER — DEXTROSE 5 % IV SOLN
Freq: Once | INTRAVENOUS | Status: AC
  Administered 2017-04-12: 09:00:00 via INTRAVENOUS
  Filled 2017-04-12: qty 1000

## 2017-04-12 MED ORDER — DEXAMETHASONE SODIUM PHOSPHATE 10 MG/ML IJ SOLN
10.0000 mg | Freq: Once | INTRAMUSCULAR | Status: AC
  Administered 2017-04-12: 10 mg via INTRAVENOUS
  Filled 2017-04-12: qty 1

## 2017-04-12 MED ORDER — PROMETHAZINE HCL 12.5 MG PO TABS: 12.5000 mg | ORAL_TABLET | Freq: Four times a day (QID) | ORAL | 0 refills | Status: DC

## 2017-04-12 MED ORDER — SODIUM CHLORIDE 0.9% FLUSH
10.0000 mL | INTRAVENOUS | Status: DC | PRN
  Administered 2017-04-12: 10 mL via INTRAVENOUS
  Filled 2017-04-12: qty 10

## 2017-04-12 MED ORDER — PALONOSETRON HCL INJECTION 0.25 MG/5ML
0.2500 mg | Freq: Once | INTRAVENOUS | Status: AC
  Administered 2017-04-12: 0.25 mg via INTRAVENOUS
  Filled 2017-04-12: qty 5

## 2017-04-12 MED ORDER — LEUCOVORIN CALCIUM INJECTION 350 MG
650.0000 mg | Freq: Once | INTRAVENOUS | Status: AC
  Administered 2017-04-12: 650 mg via INTRAVENOUS
  Filled 2017-04-12: qty 32.5

## 2017-04-12 MED ORDER — SODIUM CHLORIDE 0.9 % IV SOLN
2400.0000 mg/m2 | INTRAVENOUS | Status: DC
  Administered 2017-04-12: 3900 mg via INTRAVENOUS
  Filled 2017-04-12: qty 78

## 2017-04-12 MED ORDER — FLUOROURACIL CHEMO INJECTION 2.5 GM/50ML
400.0000 mg/m2 | Freq: Once | INTRAVENOUS | Status: AC
  Administered 2017-04-12: 650 mg via INTRAVENOUS
  Filled 2017-04-12: qty 13

## 2017-04-12 MED ORDER — DEXTROSE 5 % IV SOLN
85.0000 mg/m2 | Freq: Once | INTRAVENOUS | Status: AC
  Administered 2017-04-12: 140 mg via INTRAVENOUS
  Filled 2017-04-12: qty 20

## 2017-04-12 MED ORDER — HEPARIN SOD (PORK) LOCK FLUSH 100 UNIT/ML IV SOLN: 500.0000 [IU] | Freq: Once | INTRAVENOUS | Status: DC

## 2017-04-12 MED ORDER — HYDROCODONE-ACETAMINOPHEN 5-325 MG PO TABS: 1.0000 | ORAL_TABLET | ORAL | 0 refills | Status: DC | PRN

## 2017-04-12 MED ORDER — MORPHINE SULFATE ER 15 MG PO TBCR: 15.0000 mg | EXTENDED_RELEASE_TABLET | Freq: Two times a day (BID) | ORAL | 0 refills | Status: DC

## 2017-04-12 NOTE — Progress Notes (Signed)
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   Chief complaint/ Reason for visit- discuss pathology results and further management  Heme/Onc history:patient is a 71 year old Caucasian male who presented to the ER in June 2018with symptoms of worsening abdominal pain. He has been havving dark tarry stools for about 3 weeks prior to that. He is also had a significant weight loss of about 10 poundsover the last few months. CT abdomen done in the ER showed multiple liver metastases as well as a mass in the gastric cardia projecting into the lumen as well as adenopathy in the gastrohepatic ligament  EGD showed: Large fungating mass with bleeding at the GE junction 48 cm from the incisors. Mass was partially obstructing and partially circumferential involving one half of the lumen. Large fungating infiltrative and ulcerated and partially circumferential mass involving one half of the lumen circumference bleeding and stigmata of recent bleeding also found in the gastric cardia  Stomach mass cardia biopsy showed poorly differentiated adenocarcinoma. Her 2 neu negative and PDL1 was expressed  CT thorax showed 7 mm lingular nodule. No evidence of bone mets on bone scan. L2 lucent lesion likely hemangioma  1st line palliative FOLFOX started on 03/27/17. PDL1 expression Positive  Interval history- energy levels are the same. Appetite is fair. Denies any blood in urine. He is using 4 doses of hydrocodone a day. B/l leg swelling which improves with elevation  ECOG PS- 2 Pain scale- 0 Opioid associated constipation- no  Review of systems- Review of Systems  Constitutional:  Positive for malaise/fatigue. Negative for chills, fever and weight loss.  HENT: Negative for congestion, ear discharge and nosebleeds.   Eyes: Negative for blurred vision.  Respiratory: Negative for cough, hemoptysis, sputum production, shortness of breath and wheezing.   Cardiovascular: Positive for leg swelling. Negative for chest pain, palpitations, orthopnea and claudication.  Gastrointestinal: Negative for abdominal pain, blood in stool, constipation, diarrhea, heartburn, melena, nausea and vomiting.  Genitourinary: Negative for dysuria, flank pain, frequency, hematuria and urgency.  Musculoskeletal: Negative for back pain, joint pain and myalgias.  Skin: Negative for rash.  Neurological: Negative for dizziness, tingling, focal weakness, seizures, weakness and headaches.  Endo/Heme/Allergies: Does not bruise/bleed easily.  Psychiatric/Behavioral: Negative for depression and suicidal ideas. The patient does not have insomnia.        No Known Allergies   Past Medical History:  Diagnosis Date  . COPD (chronic obstructive pulmonary disease) ([place])      Past Surgical History:  Procedure Laterality Date  . ESOPHAGOGASTRODUODENOSCOPY (EGD) WITH PROPOFOL N/A 03/10/2017   Procedure: ESOPHAGOGASTRODUODENOSCOPY (EGD) WITH PROPOFOL;  Surgeon: Wilford Corner, MD;  Location: [facility] ENDOSCOPY;  Service: Endoscopy;  Laterality: N/A;  . KNEE ARTHROSCOPY  1991  . PORTA CATH INSERTION N/A 03/21/2017   Procedure: Glori Luis Cath Insertion;  Surgeon: Algernon Huxley, MD;  Location: [place] CV LAB;  Service: Cardiovascular;  Laterality: N/A;  . TONSILLECTOMY      Social History   Social History  . Marital status: Married    Spouse name: N/A  . Number of children: N/A  . Years of education: N/A   Occupational History  . Not on file.   Social History Main Topics  .  Smoking status: Former Smoker    Quit date: 06/2010  . Smokeless tobacco: Never Used  . Alcohol use No  . Drug use: No  .  Sexual activity: Not on file   Other Topics Concern  . Not on file   Social History Narrative  . No narrative on file    No family history on file.   Current Outpatient Prescriptions:  .  dexamethasone (DECADRON) 4 MG tablet, Take 2 tablets (8 mg total) by mouth daily. Start the day after chemotherapy for 2 days. Take with food., Disp: 30 tablet, Rfl: 1 .  HYDROcodone-acetaminophen (NORCO/VICODIN) 5-325 MG tablet, Take 1 tablet by mouth every 4 (four) hours as needed for moderate pain or severe pain., Disp: 60 tablet, Rfl: 0 .  lidocaine-prilocaine (EMLA) cream, Apply to affected area once, Disp: 30 g, Rfl: 3 .  LORazepam (ATIVAN) 0.5 MG tablet, Take 1 tablet (0.5 mg total) by mouth every 6 (six) hours as needed (Nausea or vomiting)., Disp: 30 tablet, Rfl: 0 .  ondansetron (ZOFRAN) 4 MG tablet, Take 1 tablet (4 mg total) by mouth every 6 (six) hours as needed for nausea., Disp: 20 tablet, Rfl: 0 .  ondansetron (ZOFRAN) 8 MG tablet, Take 1 tablet (8 mg total) by mouth 2 (two) times daily as needed for refractory nausea / vomiting. Start on day 3 after chemotherapy., Disp: 30 tablet, Rfl: 1 .  pantoprazole (PROTONIX) 40 MG tablet, Take 1 tablet (40 mg total) by mouth 2 (two) times daily., Disp: 60 tablet, Rfl: 0 .  prochlorperazine (COMPAZINE) 10 MG tablet, Take 1 tablet (10 mg total) by mouth every 6 (six) hours as needed (Nausea or vomiting)., Disp: 30 tablet, Rfl: 1 .  promethazine (PHENERGAN) 12.5 MG tablet, Take 12.5 mg by mouth 4 (four) times daily., Disp: , Rfl: 0 No current facility-administered medications for this visit.   Facility-Administered Medications Ordered in Other Visits:  .  heparin lock flush 100 unit/mL, 500 Units, Intravenous, Once, Randa Evens C, MD .  sodium chloride flush (NS) 0.9 % injection 10 mL, 10 mL, Intravenous, PRN, Sindy Guadeloupe, MD, 10 mL at 04/12/17 0805  Physical exam:  Vitals:   04/12/17 0828  BP: (!) 153/74  Pulse: 83  Resp: 18  Temp: (!) 96.2  F (35.7 C)  TempSrc: Tympanic  Weight: 114 lb 12.8 oz (52.1 kg)   Physical Exam  Constitutional: He is oriented to person, place, and time.  He is thin and cachectic. In no acute distress  HENT:  Head: Normocephalic and atraumatic.  Eyes: Pupils are equal, round, and reactive to light. EOM are normal.  Neck: Normal range of motion.  Cardiovascular: Normal rate, regular rhythm and normal heart sounds.   Pulmonary/Chest: Effort normal and breath sounds normal.  Abdominal: Soft. Bowel sounds are normal.  Musculoskeletal: He exhibits edema.  Neurological: He is alert and oriented to person, place, and time.  Skin: Skin is warm and dry.     CMP Latest Ref Rng & Units 04/12/2017  Glucose 65 - 99 mg/dL 97  BUN 6 - 20 mg/dL 15  Creatinine 0.61 - 1.24 mg/dL 0.55(L)  Sodium 135 - 145 mmol/L 133(L)  Potassium 3.5 - 5.1 mmol/L 4.2  Chloride 101 - 111 mmol/L 98(L)  CO2 22 - 32 mmol/L 28  Calcium 8.9 - 10.3 mg/dL 8.9  Total Protein 6.5 - 8.1 g/dL 5.4(L)  Total Bilirubin 0.3 - 1.2 mg/dL 0.4  Alkaline Phos 38 - 126 U/L 120  AST 15 -  41 U/L 21  ALT 17 - 63 U/L 15(L)   CBC Latest Ref Rng & Units 04/12/2017  WBC 3.8 - 10.6 K/uL 8.2  Hemoglobin 13.0 - 18.0 g/dL 9.4(L)  Hematocrit 40.0 - 52.0 % 28.1(L)  Platelets 150 - 440 K/uL 341      Assessment and plan- Patient is a 71 y.o. male with Stage IV GE junction adenocarcinoma with liver metastases here for on treatment assessment prior to cycle 2 of FOLFOX  1. Counts ok to proceed with cycle 2 of FOLFOX. rtc in 2 weeks with labs for cycle 3 of FOLFOX  2. Iron deficiency anemia- due to upper GI bleeding from tumor.  He will receive 2nd dose of feraheme dose today.   3. Neoplasm related pain- continue hydrocodone prn. Refill given. Long acting morphine 15 mg BID started.  4. Cancer associated cachexia and malnutrition- continue to monitor  5. Leg swelling- likely due to malnutrtion/ low protein. Keep legs elevated. Compression  stockings. Both LE equally swollen. Clinically this does not look like DVT    Visit Diagnosis 1. Liver metastases ([place])   2. Iron deficiency anemia due to chronic blood loss   3. Esophageal cancer, stage IV ([place])   4. Encounter for antineoplastic chemotherapy   5. Neoplasm related pain      Dr. Randa Evens, MD, MPH [facility] Pager- [phone] 04/12/2017  8:54 AM

## 2017-04-12 NOTE — Addendum Note (Signed)
Addended by: Luella Cook on: 04/12/2017 09:33 AM   Modules accepted: Orders

## 2017-04-12 NOTE — Progress Notes (Signed)
Here for follow up appt. Needs refill of phenergan 12.5 mg to take q 6 h prn. Stated only med for nausea that helps

## 2017-04-12 NOTE — Telephone Encounter (Signed)
Lab, follow up and chemo/Folfox scheduled for 07/31, per 04/12/17 los. Pump D/C scheduled for 04/14/17, per 04/12/17 los.  Patient was given a copy of the AVS report and appointment schedule,per 04/12/17 los.

## 2017-04-12 NOTE — Telephone Encounter (Signed)
Pump D/C scheduled for 08/02, per 04/12/17 los.

## 2017-04-13 NOTE — Telephone Encounter (Signed)
I did give the niece the appt to see rao 7/26 at 2 pm and she is agreeable to it.

## 2017-04-14 ENCOUNTER — Inpatient Hospital Stay: Payer: PPO

## 2017-04-14 VITALS — BP 158/75 | HR 76 | Temp 96.3°F | Resp 18

## 2017-04-14 DIAGNOSIS — C159 Malignant neoplasm of esophagus, unspecified: Secondary | ICD-10-CM

## 2017-04-14 DIAGNOSIS — C799 Secondary malignant neoplasm of unspecified site: Secondary | ICD-10-CM

## 2017-04-14 DIAGNOSIS — Z5111 Encounter for antineoplastic chemotherapy: Secondary | ICD-10-CM | POA: Diagnosis not present

## 2017-04-14 MED ORDER — HEPARIN SOD (PORK) LOCK FLUSH 100 UNIT/ML IV SOLN
500.0000 [IU] | Freq: Once | INTRAVENOUS | Status: AC | PRN
Start: 2017-04-14 — End: 2017-04-14
  Administered 2017-04-14: 500 [IU]

## 2017-04-14 MED ORDER — SODIUM CHLORIDE 0.9% FLUSH
10.0000 mL | INTRAVENOUS | Status: DC | PRN
  Administered 2017-04-14: 10 mL
  Filled 2017-04-14: qty 10

## 2017-04-26 DIAGNOSIS — C159 Malignant neoplasm of esophagus, unspecified: Secondary | ICD-10-CM | POA: Diagnosis not present

## 2017-05-01 ENCOUNTER — Inpatient Hospital Stay: Payer: PPO

## 2017-05-01 ENCOUNTER — Encounter: Payer: Self-pay | Admitting: Oncology

## 2017-05-01 ENCOUNTER — Other Ambulatory Visit: Payer: Self-pay | Admitting: *Deleted

## 2017-05-01 ENCOUNTER — Inpatient Hospital Stay (HOSPITAL_BASED_OUTPATIENT_CLINIC_OR_DEPARTMENT_OTHER): Payer: PPO | Admitting: Oncology

## 2017-05-01 VITALS — BP 147/75 | HR 86 | Temp 95.5°F | Resp 18 | Wt 113.9 lb

## 2017-05-01 DIAGNOSIS — Z5111 Encounter for antineoplastic chemotherapy: Secondary | ICD-10-CM | POA: Diagnosis not present

## 2017-05-01 DIAGNOSIS — M7989 Other specified soft tissue disorders: Secondary | ICD-10-CM | POA: Diagnosis not present

## 2017-05-01 DIAGNOSIS — C799 Secondary malignant neoplasm of unspecified site: Secondary | ICD-10-CM

## 2017-05-01 DIAGNOSIS — C160 Malignant neoplasm of cardia: Secondary | ICD-10-CM | POA: Diagnosis not present

## 2017-05-01 DIAGNOSIS — D500 Iron deficiency anemia secondary to blood loss (chronic): Secondary | ICD-10-CM | POA: Diagnosis not present

## 2017-05-01 DIAGNOSIS — C787 Secondary malignant neoplasm of liver and intrahepatic bile duct: Secondary | ICD-10-CM | POA: Diagnosis not present

## 2017-05-01 DIAGNOSIS — G893 Neoplasm related pain (acute) (chronic): Secondary | ICD-10-CM

## 2017-05-01 DIAGNOSIS — Z79899 Other long term (current) drug therapy: Secondary | ICD-10-CM

## 2017-05-01 DIAGNOSIS — E46 Unspecified protein-calorie malnutrition: Secondary | ICD-10-CM

## 2017-05-01 DIAGNOSIS — C159 Malignant neoplasm of esophagus, unspecified: Secondary | ICD-10-CM

## 2017-05-01 DIAGNOSIS — R64 Cachexia: Secondary | ICD-10-CM | POA: Diagnosis not present

## 2017-05-01 DIAGNOSIS — F17210 Nicotine dependence, cigarettes, uncomplicated: Secondary | ICD-10-CM | POA: Diagnosis not present

## 2017-05-01 DIAGNOSIS — J449 Chronic obstructive pulmonary disease, unspecified: Secondary | ICD-10-CM

## 2017-05-01 LAB — COMPREHENSIVE METABOLIC PANEL
ALBUMIN: 2.9 g/dL — AB (ref 3.5–5.0)
ALT: 13 U/L — ABNORMAL LOW (ref 17–63)
ANION GAP: 4 — AB (ref 5–15)
AST: 25 U/L (ref 15–41)
Alkaline Phosphatase: 119 U/L (ref 38–126)
BILIRUBIN TOTAL: 0.3 mg/dL (ref 0.3–1.2)
BUN: 15 mg/dL (ref 6–20)
CO2: 29 mmol/L (ref 22–32)
Calcium: 8.6 mg/dL — ABNORMAL LOW (ref 8.9–10.3)
Chloride: 102 mmol/L (ref 101–111)
Creatinine, Ser: 0.7 mg/dL (ref 0.61–1.24)
GFR calc non Af Amer: >60 mL/min (ref >60)
Glucose, Bld: 129 mg/dL — ABNORMAL HIGH (ref 65–99)
POTASSIUM: 3.8 mmol/L (ref 3.5–5.1)
SODIUM: 135 mmol/L (ref 135–145)
TOTAL PROTEIN: 5.1 g/dL — AB (ref 6.5–8.1)

## 2017-05-01 LAB — CBC WITH DIFFERENTIAL/PLATELET
BASOS PCT: 1 %
Basophils Absolute: 0.1 10*3/uL (ref 0–0.1)
EOS ABS: 0.2 10*3/uL (ref 0–0.7)
Eosinophils Relative: 3 %
HCT: 30.1 % — ABNORMAL LOW (ref 40.0–52.0)
Hemoglobin: 10.3 g/dL — ABNORMAL LOW (ref 13.0–18.0)
LYMPHS ABS: 1.2 10*3/uL (ref 1.0–3.6)
Lymphocytes Relative: 20 %
MCH: 28.3 pg (ref 26.0–34.0)
MCHC: 34.1 g/dL (ref 32.0–36.0)
MCV: 82.8 fL (ref 80.0–100.0)
MONO ABS: 1.1 10*3/uL — AB (ref 0.2–1.0)
MONOS PCT: 20 %
Neutro Abs: 3.3 10*3/uL (ref 1.4–6.5)
Neutrophils Relative %: 56 %
Platelets: 328 10*3/uL (ref 150–440)
RBC: 3.64 MIL/uL — ABNORMAL LOW (ref 4.40–5.90)
RDW: 20.1 % — AB (ref 11.5–14.5)
WBC: 5.9 10*3/uL (ref 3.8–10.6)

## 2017-05-01 LAB — MAGNESIUM: MAGNESIUM: 1.5 mg/dL — AB (ref 1.7–2.4)

## 2017-05-01 MED ORDER — LEUCOVORIN CALCIUM INJECTION 350 MG
650.0000 mg | Freq: Once | INTRAVENOUS | Status: AC
  Administered 2017-05-01: 650 mg via INTRAVENOUS
  Filled 2017-05-01: qty 32.5

## 2017-05-01 MED ORDER — PALONOSETRON HCL INJECTION 0.25 MG/5ML
0.2500 mg | Freq: Once | INTRAVENOUS | Status: AC
  Administered 2017-05-01: 0.25 mg via INTRAVENOUS
  Filled 2017-05-01: qty 5

## 2017-05-01 MED ORDER — DEXTROSE 5 % IV SOLN
Freq: Once | INTRAVENOUS | Status: AC
  Administered 2017-05-01: 09:00:00 via INTRAVENOUS
  Filled 2017-05-01: qty 1000

## 2017-05-01 MED ORDER — SODIUM CHLORIDE 0.9 % IV SOLN
2400.0000 mg/m2 | INTRAVENOUS | Status: DC
Start: 2017-05-01 — End: 2017-05-01
  Administered 2017-05-01: 3900 mg via INTRAVENOUS
  Filled 2017-05-01: qty 78

## 2017-05-01 MED ORDER — DEXAMETHASONE SODIUM PHOSPHATE 10 MG/ML IJ SOLN
10.0000 mg | Freq: Once | INTRAMUSCULAR | Status: AC
  Administered 2017-05-01: 10 mg via INTRAVENOUS
  Filled 2017-05-01: qty 1

## 2017-05-01 MED ORDER — FLUOROURACIL CHEMO INJECTION 2.5 GM/50ML
400.0000 mg/m2 | Freq: Once | INTRAVENOUS | Status: AC
  Administered 2017-05-01: 650 mg via INTRAVENOUS
  Filled 2017-05-01: qty 13

## 2017-05-01 MED ORDER — PANTOPRAZOLE SODIUM 40 MG PO TBEC: 40.0000 mg | DELAYED_RELEASE_TABLET | Freq: Two times a day (BID) | ORAL | 2 refills | Status: DC

## 2017-05-01 MED ORDER — SODIUM CHLORIDE 0.9% FLUSH
10.0000 mL | Freq: Once | INTRAVENOUS | Status: AC
  Administered 2017-05-01: 10 mL via INTRAVENOUS
  Filled 2017-05-01: qty 10

## 2017-05-01 MED ORDER — PROMETHAZINE HCL 12.5 MG PO TABS: 12.5000 mg | ORAL_TABLET | Freq: Four times a day (QID) | ORAL | 1 refills | Status: DC

## 2017-05-01 MED ORDER — OXALIPLATIN CHEMO INJECTION 100 MG/20ML
85.0000 mg/m2 | Freq: Once | INTRAVENOUS | Status: AC
  Administered 2017-05-01: 140 mg via INTRAVENOUS
  Filled 2017-05-01: qty 20

## 2017-05-01 MED ORDER — HEPARIN SOD (PORK) LOCK FLUSH 100 UNIT/ML IV SOLN
500.0000 [IU] | Freq: Once | INTRAVENOUS | Status: DC
  Filled 2017-05-01: qty 5

## 2017-05-01 NOTE — Progress Notes (Signed)
Here for follow up

## 2017-05-01 NOTE — Progress Notes (Signed)
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   Chief complaint/ Reason for visit- on treatment assessment prior to cycle 3 of folfox  Heme/Onc history:patient is a 71 year old Caucasian male who presented to the ER in June 2018with symptoms of worsening abdominal pain. He has been havving dark tarry stools for about 3 weeks prior to that. He is also had a significant weight loss of about 10 poundsover the last few months. CT abdomen done in the ER showed multiple liver metastases as well as a mass in the gastric cardia projecting into the lumen as well as adenopathy in the gastrohepatic ligament  EGD showed: Large fungating mass with bleeding at the GE junction 48 cm from the incisors. Mass was partially obstructing and partially circumferential involving one half of the lumen. Large fungating infiltrative and ulcerated and partially circumferential mass involving one half of the lumen circumference bleeding and stigmata of recent bleeding also found in the gastric cardia  Stomach mass cardia biopsy showed poorly differentiated adenocarcinoma. Her 2 neu negativeand PDL1 was expressed  CT thorax showed 7 mm lingular nodule. No evidence of bone mets on bone scan. L2 lucent lesion likely hemangioma  1st line palliative FOLFOX started on 03/27/17. PDL1 expression Positive  Interval history- pain well controlled with ms contin. He rarely uses hydrocodone. Denies any constipation, melena or blood in stools. Still has b/l leg swelling  ECOG PS- 1 Pain scale- 0 Opioid associated constipation- no  Review of systems- Review of Systems  Constitutional: Positive for  malaise/fatigue. Negative for chills, fever and weight loss.  HENT: Negative for congestion, ear discharge and nosebleeds.   Eyes: Negative for blurred vision.  Respiratory: Negative for cough, hemoptysis, sputum production, shortness of breath and wheezing.   Cardiovascular: Positive for leg swelling. Negative for chest pain, palpitations, orthopnea and claudication.  Gastrointestinal: Negative for abdominal pain, blood in stool, constipation, diarrhea, heartburn, melena, nausea and vomiting.  Genitourinary: Negative for dysuria, flank pain, frequency, hematuria and urgency.  Musculoskeletal: Negative for back pain, joint pain and myalgias.  Skin: Negative for rash.  Neurological: Negative for dizziness, tingling, focal weakness, seizures, weakness and headaches.  Endo/Heme/Allergies: Does not bruise/bleed easily.  Psychiatric/Behavioral: Negative for depression and suicidal ideas. The patient does not have insomnia.       No Known Allergies   Past Medical History:  Diagnosis Date  . COPD (chronic obstructive pulmonary disease) ([place])      Past Surgical History:  Procedure Laterality Date  . ESOPHAGOGASTRODUODENOSCOPY (EGD) WITH PROPOFOL N/A 03/10/2017   Procedure: ESOPHAGOGASTRODUODENOSCOPY (EGD) WITH PROPOFOL;  Surgeon: Wilford Corner, MD;  Location: [facility] ENDOSCOPY;  Service: Endoscopy;  Laterality: N/A;  . KNEE ARTHROSCOPY  1991  . PORTA CATH INSERTION N/A 03/21/2017   Procedure: Glori Luis Cath Insertion;  Surgeon: Algernon Huxley, MD;  Location: [place] CV LAB;  Service: Cardiovascular;  Laterality: N/A;  . TONSILLECTOMY      Social History   Social History  . Marital status: Married    Spouse name: N/A  . Number of children: N/A  . Years of education: N/A   Occupational History  . Not on file.   Social History Main Topics  . Smoking status: Former Smoker  Quit date: 06/2010  . Smokeless tobacco: Never Used  . Alcohol use No  . Drug use: No  . Sexual activity:  Not on file   Other Topics Concern  . Not on file   Social History Narrative  . No narrative on file    No family history on file.   Current Outpatient Prescriptions:  .  dexamethasone (DECADRON) 4 MG tablet, Take 2 tablets (8 mg total) by mouth daily. Start the day after chemotherapy for 2 days. Take with food., Disp: 30 tablet, Rfl: 1 .  HYDROcodone-acetaminophen (NORCO/VICODIN) 5-325 MG tablet, Take 1 tablet by mouth every 4 (four) hours as needed for moderate pain or severe pain., Disp: 60 tablet, Rfl: 0 .  lidocaine-prilocaine (EMLA) cream, Apply to affected area once, Disp: 30 g, Rfl: 3 .  LORazepam (ATIVAN) 0.5 MG tablet, Take 1 tablet (0.5 mg total) by mouth every 6 (six) hours as needed (Nausea or vomiting). (Patient not taking: Reported on 04/12/2017), Disp: 30 tablet, Rfl: 0 .  morphine (MS CONTIN) 15 MG 12 hr tablet, Take 1 tablet (15 mg total) by mouth every 12 (twelve) hours., Disp: 60 tablet, Rfl: 0 .  ondansetron (ZOFRAN) 4 MG tablet, Take 1 tablet (4 mg total) by mouth every 6 (six) hours as needed for nausea. (Patient not taking: Reported on 04/12/2017), Disp: 20 tablet, Rfl: 0 .  ondansetron (ZOFRAN) 8 MG tablet, Take 1 tablet (8 mg total) by mouth 2 (two) times daily as needed for refractory nausea / vomiting. Start on day 3 after chemotherapy. (Patient not taking: Reported on 04/12/2017), Disp: 30 tablet, Rfl: 1 .  pantoprazole (PROTONIX) 40 MG tablet, Take 1 tablet (40 mg total) by mouth 2 (two) times daily., Disp: 60 tablet, Rfl: 0 .  prochlorperazine (COMPAZINE) 10 MG tablet, Take 1 tablet (10 mg total) by mouth every 6 (six) hours as needed (Nausea or vomiting). (Patient not taking: Reported on 04/12/2017), Disp: 30 tablet, Rfl: 1 .  promethazine (PHENERGAN) 12.5 MG tablet, Take 1 tablet (12.5 mg total) by mouth 4 (four) times daily., Disp: 30 tablet, Rfl: 0 No current facility-administered medications for this visit.   Facility-Administered Medications Ordered in Other  Visits:  .  heparin lock flush 100 unit/mL, 500 Units, Intravenous, Once, Randa Evens C, MD .  sodium chloride flush (NS) 0.9 % injection 10 mL, 10 mL, Intracatheter, PRN, Sindy Guadeloupe, MD, 10 mL at 04/14/17 1055  Physical exam:  Vitals:   05/01/17 0833  BP: (!) 147/75  Pulse: 86  Resp: 18  Temp: (!) 95.5 F (35.3 C)  TempSrc: Tympanic  Weight: 113 lb 14.4 oz (51.7 kg)   Physical Exam  Constitutional: He is oriented to person, place, and time.  He is thin and cachectic. Appears in no acute distress  HENT:  Head: Normocephalic and atraumatic.  Eyes: Pupils are equal, round, and reactive to light. EOM are normal.  Neck: Normal range of motion.  Cardiovascular: Normal rate, regular rhythm and normal heart sounds.   Pulmonary/Chest: Effort normal and breath sounds normal.  Abdominal: Soft. Bowel sounds are normal.  Musculoskeletal: He exhibits edema (b/l +2).  Neurological: He is alert and oriented to person, place, and time.  Skin: Skin is warm and dry.     CMP Latest Ref Rng & Units 05/01/2017  Glucose 65 - 99 mg/dL 129(H)  BUN 6 - 20 mg/dL 15  Creatinine 0.61 - 1.24 mg/dL 0.70  Sodium 135 - 145 mmol/L 135  Potassium 3.5 - 5.1  mmol/L 3.8  Chloride 101 - 111 mmol/L 102  CO2 22 - 32 mmol/L 29  Calcium 8.9 - 10.3 mg/dL 8.6(L)  Total Protein 6.5 - 8.1 g/dL 5.1(L)  Total Bilirubin 0.3 - 1.2 mg/dL 0.3  Alkaline Phos 38 - 126 U/L 119  AST 15 - 41 U/L 25  ALT 17 - 63 U/L 13(L)   CBC Latest Ref Rng & Units 05/01/2017  WBC 3.8 - 10.6 K/uL 5.9  Hemoglobin 13.0 - 18.0 g/dL 10.3(L)  Hematocrit 40.0 - 52.0 % 30.1(L)  Platelets 150 - 440 K/uL 328     Assessment and plan-  Patient is a 71 y.o.malewith Stage IV GE junction adenocarcinoma with liver metastases here for on treatment assessment prior to cycle 3 of FOLFOX  1. Counts ok to proceed with cycle 3 of FOLFOX. rtc in 2 weeks with labs- cbc, cmp  for cycle 4 of FOLFOX. Baseline CEA has not been elevated  2. Iron  deficiency anemia- due to upper GI bleeding from tumor. S/p 2 doses of feraheme. H/H improved. Repeat iron studies in 2 weeks  3. Neoplasm related pain- continue hydrocodone prn and morphine ER15 mg BID  4. Cancer associated cachexia and malnutrition- continue to monitor. Patient does not want to see dietician at this point.   5. Leg swelling- likely due to malnutrtion/ low albumin. Keep legs elevated. Compression stockings.  I will see him in 4 weeks time with cbc, cmp for cycle 5 of FOLFOLX. Scans after cycle 6     Visit Diagnosis 1. Esophageal cancer, stage IV ([place])   2. Liver metastases ([place])   3. Encounter for antineoplastic chemotherapy   4. Metastasis from esophageal cancer ([place])   5. Neoplasm related pain      Dr. Randa Evens, MD, MPH [facility] Pager- [phone] 05/01/2017 9:23 AM

## 2017-05-03 ENCOUNTER — Inpatient Hospital Stay: Payer: PPO | Attending: Oncology

## 2017-05-03 DIAGNOSIS — M7989 Other specified soft tissue disorders: Secondary | ICD-10-CM | POA: Diagnosis not present

## 2017-05-03 DIAGNOSIS — Z79899 Other long term (current) drug therapy: Secondary | ICD-10-CM | POA: Insufficient documentation

## 2017-05-03 DIAGNOSIS — C160 Malignant neoplasm of cardia: Secondary | ICD-10-CM | POA: Diagnosis not present

## 2017-05-03 DIAGNOSIS — C787 Secondary malignant neoplasm of liver and intrahepatic bile duct: Secondary | ICD-10-CM | POA: Insufficient documentation

## 2017-05-03 DIAGNOSIS — Z87891 Personal history of nicotine dependence: Secondary | ICD-10-CM | POA: Insufficient documentation

## 2017-05-03 DIAGNOSIS — D6481 Anemia due to antineoplastic chemotherapy: Secondary | ICD-10-CM | POA: Insufficient documentation

## 2017-05-03 DIAGNOSIS — C159 Malignant neoplasm of esophagus, unspecified: Secondary | ICD-10-CM

## 2017-05-03 DIAGNOSIS — G893 Neoplasm related pain (acute) (chronic): Secondary | ICD-10-CM | POA: Diagnosis not present

## 2017-05-03 DIAGNOSIS — R64 Cachexia: Secondary | ICD-10-CM | POA: Diagnosis not present

## 2017-05-03 DIAGNOSIS — R634 Abnormal weight loss: Secondary | ICD-10-CM | POA: Insufficient documentation

## 2017-05-03 DIAGNOSIS — J449 Chronic obstructive pulmonary disease, unspecified: Secondary | ICD-10-CM | POA: Insufficient documentation

## 2017-05-03 DIAGNOSIS — Z5111 Encounter for antineoplastic chemotherapy: Secondary | ICD-10-CM | POA: Insufficient documentation

## 2017-05-03 DIAGNOSIS — C799 Secondary malignant neoplasm of unspecified site: Secondary | ICD-10-CM

## 2017-05-03 DIAGNOSIS — E46 Unspecified protein-calorie malnutrition: Secondary | ICD-10-CM | POA: Insufficient documentation

## 2017-05-03 MED ORDER — SODIUM CHLORIDE 0.9% FLUSH
10.0000 mL | INTRAVENOUS | Status: DC | PRN
  Filled 2017-05-03: qty 10

## 2017-05-03 MED ORDER — SODIUM CHLORIDE 0.9% FLUSH
10.0000 mL | INTRAVENOUS | Status: DC | PRN
  Administered 2017-05-03: 10 mL
  Filled 2017-05-03: qty 10

## 2017-05-03 MED ORDER — HEPARIN SOD (PORK) LOCK FLUSH 100 UNIT/ML IV SOLN
500.0000 [IU] | Freq: Once | INTRAVENOUS | Status: AC | PRN
  Administered 2017-05-03: 500 [IU]
  Filled 2017-05-03: qty 5

## 2017-05-03 MED ORDER — HEPARIN SOD (PORK) LOCK FLUSH 100 UNIT/ML IV SOLN: 500.0000 [IU] | Freq: Once | INTRAVENOUS | Status: AC

## 2017-05-04 ENCOUNTER — Telehealth: Payer: Self-pay | Admitting: *Deleted

## 2017-05-04 ENCOUNTER — Other Ambulatory Visit: Payer: Self-pay | Admitting: *Deleted

## 2017-05-04 MED ORDER — FUROSEMIDE 20 MG PO TABS: 20.0000 mg | ORAL_TABLET | Freq: Every day | ORAL | 0 refills | Status: DC | PRN

## 2017-05-04 NOTE — Telephone Encounter (Signed)
Called pt to let him know that I have sent in the rx for lasix. I sent it in and let him know that I was not here for 1/2 day yesterday.  I am sorry that it was not sent in but I just got message this am about it. I explained to him that he should only taking it when he has swelling and if he does not have any or not much then he does not have to take it.

## 2017-05-04 NOTE — Telephone Encounter (Signed)
-----   Message from [place] sent at 05/03/2017  1:40 PM EDT ----- Regarding: script Pt stated script for fluid pills was not called into [facility], please return call to patient. Thanks!

## 2017-05-10 ENCOUNTER — Telehealth: Payer: Self-pay | Admitting: *Deleted

## 2017-05-10 ENCOUNTER — Other Ambulatory Visit: Payer: Self-pay | Admitting: *Deleted

## 2017-05-10 MED ORDER — MORPHINE SULFATE ER 15 MG PO TBCR: 15.0000 mg | EXTENDED_RELEASE_TABLET | Freq: Two times a day (BID) | ORAL | 0 refills | Status: DC

## 2017-05-10 NOTE — Telephone Encounter (Signed)
Pt called and wanted refill of ms contin. I told him that his wife could come and pick it up after 9:30 this am.  Rx ready and sent to main desk for pick up.

## 2017-05-15 ENCOUNTER — Inpatient Hospital Stay (HOSPITAL_BASED_OUTPATIENT_CLINIC_OR_DEPARTMENT_OTHER): Payer: PPO | Admitting: Oncology

## 2017-05-15 ENCOUNTER — Inpatient Hospital Stay: Payer: PPO

## 2017-05-15 ENCOUNTER — Encounter: Payer: Self-pay | Admitting: Oncology

## 2017-05-15 VITALS — BP 172/61 | HR 86 | Temp 95.8°F | Resp 16 | Wt 112.1 lb

## 2017-05-15 DIAGNOSIS — C160 Malignant neoplasm of cardia: Secondary | ICD-10-CM

## 2017-05-15 DIAGNOSIS — E46 Unspecified protein-calorie malnutrition: Secondary | ICD-10-CM | POA: Diagnosis not present

## 2017-05-15 DIAGNOSIS — R634 Abnormal weight loss: Secondary | ICD-10-CM | POA: Diagnosis not present

## 2017-05-15 DIAGNOSIS — C159 Malignant neoplasm of esophagus, unspecified: Secondary | ICD-10-CM

## 2017-05-15 DIAGNOSIS — G893 Neoplasm related pain (acute) (chronic): Secondary | ICD-10-CM | POA: Diagnosis not present

## 2017-05-15 DIAGNOSIS — D6481 Anemia due to antineoplastic chemotherapy: Secondary | ICD-10-CM | POA: Diagnosis not present

## 2017-05-15 DIAGNOSIS — Z87891 Personal history of nicotine dependence: Secondary | ICD-10-CM | POA: Diagnosis not present

## 2017-05-15 DIAGNOSIS — M7989 Other specified soft tissue disorders: Secondary | ICD-10-CM

## 2017-05-15 DIAGNOSIS — C787 Secondary malignant neoplasm of liver and intrahepatic bile duct: Secondary | ICD-10-CM | POA: Diagnosis not present

## 2017-05-15 DIAGNOSIS — R64 Cachexia: Secondary | ICD-10-CM

## 2017-05-15 DIAGNOSIS — J449 Chronic obstructive pulmonary disease, unspecified: Secondary | ICD-10-CM | POA: Diagnosis not present

## 2017-05-15 DIAGNOSIS — Z7189 Other specified counseling: Secondary | ICD-10-CM

## 2017-05-15 DIAGNOSIS — Z79899 Other long term (current) drug therapy: Secondary | ICD-10-CM | POA: Diagnosis not present

## 2017-05-15 DIAGNOSIS — C799 Secondary malignant neoplasm of unspecified site: Principal | ICD-10-CM

## 2017-05-15 DIAGNOSIS — D500 Iron deficiency anemia secondary to blood loss (chronic): Secondary | ICD-10-CM

## 2017-05-15 DIAGNOSIS — Z5111 Encounter for antineoplastic chemotherapy: Secondary | ICD-10-CM | POA: Diagnosis not present

## 2017-05-15 LAB — COMPREHENSIVE METABOLIC PANEL
ALBUMIN: 2.8 g/dL — AB (ref 3.5–5.0)
ALT: 13 U/L — ABNORMAL LOW (ref 17–63)
AST: 24 U/L (ref 15–41)
Alkaline Phosphatase: 105 U/L (ref 38–126)
Anion gap: 6 (ref 5–15)
BILIRUBIN TOTAL: 0.4 mg/dL (ref 0.3–1.2)
BUN: 15 mg/dL (ref 6–20)
CO2: 26 mmol/L (ref 22–32)
Calcium: 8.3 mg/dL — ABNORMAL LOW (ref 8.9–10.3)
Chloride: 102 mmol/L (ref 101–111)
Creatinine, Ser: 0.57 mg/dL — ABNORMAL LOW (ref 0.61–1.24)
Glucose, Bld: 115 mg/dL — ABNORMAL HIGH (ref 65–99)
POTASSIUM: 4 mmol/L (ref 3.5–5.1)
SODIUM: 134 mmol/L — AB (ref 135–145)
TOTAL PROTEIN: 5.2 g/dL — AB (ref 6.5–8.1)

## 2017-05-15 LAB — CBC WITH DIFFERENTIAL/PLATELET
Basophils Absolute: 0 10*3/uL (ref 0–0.1)
Basophils Relative: 1 %
EOS ABS: 0.1 10*3/uL (ref 0–0.7)
Eosinophils Relative: 1 %
HCT: 26.2 % — ABNORMAL LOW (ref 40.0–52.0)
HEMOGLOBIN: 8.9 g/dL — AB (ref 13.0–18.0)
LYMPHS ABS: 0.7 10*3/uL — AB (ref 1.0–3.6)
Lymphocytes Relative: 12 %
MCH: 28.4 pg (ref 26.0–34.0)
MCHC: 33.9 g/dL (ref 32.0–36.0)
MCV: 83.8 fL (ref 80.0–100.0)
Monocytes Absolute: 0.8 10*3/uL (ref 0.2–1.0)
Monocytes Relative: 14 %
NEUTROS PCT: 72 %
Neutro Abs: 4.5 10*3/uL (ref 1.4–6.5)
Platelets: 261 10*3/uL (ref 150–440)
RBC: 3.13 MIL/uL — AB (ref 4.40–5.90)
RDW: 21.3 % — ABNORMAL HIGH (ref 11.5–14.5)
WBC: 6.1 10*3/uL (ref 3.8–10.6)

## 2017-05-15 MED ORDER — FLUOROURACIL CHEMO INJECTION 2.5 GM/50ML
400.0000 mg/m2 | Freq: Once | INTRAVENOUS | Status: AC
  Administered 2017-05-15: 650 mg via INTRAVENOUS
  Filled 2017-05-15: qty 13

## 2017-05-15 MED ORDER — PALONOSETRON HCL INJECTION 0.25 MG/5ML
0.2500 mg | Freq: Once | INTRAVENOUS | Status: AC
  Administered 2017-05-15: 0.25 mg via INTRAVENOUS
  Filled 2017-05-15: qty 5

## 2017-05-15 MED ORDER — SODIUM CHLORIDE 0.9 % IV SOLN
2400.0000 mg/m2 | INTRAVENOUS | Status: DC
  Administered 2017-05-15: 3900 mg via INTRAVENOUS
  Filled 2017-05-15: qty 78

## 2017-05-15 MED ORDER — LEUCOVORIN CALCIUM INJECTION 350 MG
650.0000 mg | Freq: Once | INTRAMUSCULAR | Status: AC
  Administered 2017-05-15: 650 mg via INTRAVENOUS
  Filled 2017-05-15: qty 32.5

## 2017-05-15 MED ORDER — DEXTROSE 5 % IV SOLN
Freq: Once | INTRAVENOUS | Status: AC
  Administered 2017-05-15: 10:00:00 via INTRAVENOUS
  Filled 2017-05-15: qty 1000

## 2017-05-15 MED ORDER — DEXAMETHASONE SODIUM PHOSPHATE 10 MG/ML IJ SOLN
10.0000 mg | Freq: Once | INTRAMUSCULAR | Status: AC
  Administered 2017-05-15: 10 mg via INTRAVENOUS
  Filled 2017-05-15: qty 1

## 2017-05-15 MED ORDER — OXALIPLATIN CHEMO INJECTION 100 MG/20ML
85.0000 mg/m2 | Freq: Once | INTRAVENOUS | Status: AC
  Administered 2017-05-15: 140 mg via INTRAVENOUS
  Filled 2017-05-15: qty 28

## 2017-05-15 NOTE — Progress Notes (Signed)
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   Chief complaint/ Reason for visit- on treatment assessment prior to cycle 4 of folfox  Heme/Onc history:patient is a 71 year old Caucasian male who presented to the ER in June 2018with symptoms of worsening abdominal pain. He has been havving dark tarry stools for about 3 weeks prior to that. He is also had a significant weight loss of about 10 poundsover the last few months. CT abdomen done in the ER showed multiple liver metastases as well as a mass in the gastric cardia projecting into the lumen as well as adenopathy in the gastrohepatic ligament  EGD showed: Large fungating mass with bleeding at the GE junction 48 cm from the incisors. Mass was partially obstructing and partially circumferential involving one half of the lumen. Large fungating infiltrative and ulcerated and partially circumferential mass involving one half of the lumen circumference bleeding and stigmata of recent bleeding also found in the gastric cardia  Stomach mass cardia biopsy showed poorly differentiated adenocarcinoma. Her 2 neu negativeand PDL1 was expressed  CT thorax showed 7 mm lingular nodule. No evidence of bone mets on bone scan. L2 lucent lesion likely hemangioma  1st line palliative FOLFOX started on 03/27/17. PDL1 expression Positive  Interval history- pain well controlled with MS contin. He rarely uses hydrocodone. Denies persistent numbness or tingling. Denies any constipation, melena or blood in stools.  Continue to have weight loss and lack of appetite.   ECOG PS- 1 Pain scale- 0 Opioid associated constipation-  no  Review of systems- Review of Systems  Constitutional: Positive for malaise/fatigue. Negative for chills, fever and weight loss.  HENT: Negative for congestion, ear discharge and nosebleeds.   Eyes: Negative for blurred vision.  Respiratory: Negative for cough, hemoptysis, sputum production, shortness of breath and wheezing.   Cardiovascular: Positive for leg swelling. Negative for chest pain, palpitations, orthopnea and claudication.  Gastrointestinal: Negative for abdominal pain, blood in stool, constipation, diarrhea, heartburn, melena, nausea and vomiting.  Genitourinary: Negative for dysuria, flank pain, frequency, hematuria and urgency.  Musculoskeletal: Negative for back pain, joint pain and myalgias.  Skin: Negative for rash.  Neurological: Negative for dizziness, tingling, focal weakness, seizures, weakness and headaches.  Endo/Heme/Allergies: Does not bruise/bleed easily.  Psychiatric/Behavioral: Negative for depression and suicidal ideas. The patient does not have insomnia.       No Known Allergies   Past Medical History:  Diagnosis Date  . COPD (chronic obstructive pulmonary disease) ([place])      Past Surgical History:  Procedure Laterality Date  . ESOPHAGOGASTRODUODENOSCOPY (EGD) WITH PROPOFOL N/A 03/10/2017   Procedure: ESOPHAGOGASTRODUODENOSCOPY (EGD) WITH PROPOFOL;  Surgeon: Wilford Corner, MD;  Location: [facility] ENDOSCOPY;  Service: Endoscopy;  Laterality: N/A;  . KNEE ARTHROSCOPY  1991  . PORTA CATH INSERTION N/A 03/21/2017   Procedure: Glori Luis Cath Insertion;  Surgeon: Algernon Huxley, MD;  Location: [place] CV LAB;  Service: Cardiovascular;  Laterality: N/A;  . TONSILLECTOMY      Social History   Social History  . Marital status: Married    Spouse name: N/A  . Number of children: N/A  . Years of education: N/A   Occupational History  . Not on file.  Social History Main Topics  . Smoking status: Former Smoker    Quit date: 06/2010  . Smokeless  tobacco: Never Used  . Alcohol use No  . Drug use: No  . Sexual activity: Not on file   Other Topics Concern  . Not on file   Social History Narrative  . No narrative on file    No family history on file.   Current Outpatient Prescriptions:  .  dexamethasone (DECADRON) 4 MG tablet, Take 2 tablets (8 mg total) by mouth daily. Start the day after chemotherapy for 2 days. Take with food., Disp: 30 tablet, Rfl: 1 .  furosemide (LASIX) 20 MG tablet, Take 1 tablet (20 mg total) by mouth daily as needed., Disp: 30 tablet, Rfl: 0 .  HYDROcodone-acetaminophen (NORCO/VICODIN) 5-325 MG tablet, Take 1 tablet by mouth every 4 (four) hours as needed for moderate pain or severe pain., Disp: 60 tablet, Rfl: 0 .  lidocaine-prilocaine (EMLA) cream, Apply to affected area once, Disp: 30 g, Rfl: 3 .  morphine (MS CONTIN) 15 MG 12 hr tablet, Take 1 tablet (15 mg total) by mouth every 12 (twelve) hours., Disp: 60 tablet, Rfl: 0 .  pantoprazole (PROTONIX) 40 MG tablet, Take 1 tablet (40 mg total) by mouth 2 (two) times daily., Disp: 60 tablet, Rfl: 2 .  promethazine (PHENERGAN) 12.5 MG tablet, Take 1 tablet (12.5 mg total) by mouth 4 (four) times daily., Disp: 30 tablet, Rfl: 1 .  LORazepam (ATIVAN) 0.5 MG tablet, Take 1 tablet (0.5 mg total) by mouth every 6 (six) hours as needed (Nausea or vomiting). (Patient not taking: Reported on 04/12/2017), Disp: 30 tablet, Rfl: 0 .  ondansetron (ZOFRAN) 4 MG tablet, Take 1 tablet (4 mg total) by mouth every 6 (six) hours as needed for nausea. (Patient not taking: Reported on 04/12/2017), Disp: 20 tablet, Rfl: 0 .  ondansetron (ZOFRAN) 8 MG tablet, Take 1 tablet (8 mg total) by mouth 2 (two) times daily as needed for refractory nausea / vomiting. Start on day 3 after chemotherapy. (Patient not taking: Reported on 04/12/2017), Disp: 30 tablet, Rfl: 1 .  prochlorperazine (COMPAZINE) 10 MG tablet, Take 1 tablet (10 mg total) by mouth every 6 (six) hours as needed (Nausea or  vomiting). (Patient not taking: Reported on 05/01/2017), Disp: 30 tablet, Rfl: 1 No current facility-administered medications for this visit.   Facility-Administered Medications Ordered in Other Visits:  .  fluorouracil (ADRUCIL) 3,900 mg in sodium chloride 0.9 % 72 mL chemo infusion, 2,400 mg/m2 (Treatment Plan Recorded), Intravenous, 1 day or 1 dose, Earlie Server, MD, 3,900 mg at 05/15/17 1329 .  sodium chloride flush (NS) 0.9 % injection 10 mL, 10 mL, Intracatheter, PRN, Sindy Guadeloupe, MD, 10 mL at 04/14/17 1055  Physical exam:  Vitals:   05/15/17 0907  BP: (!) 172/61  Pulse: 86  Resp: 16  Temp: (!) 95.8 F (35.4 C)  TempSrc: Tympanic  Weight: 112 lb 2 oz (50.9 kg)   Physical Exam  Constitutional: He is oriented to person, place, and time.  He is thin and cachectic. Appears in no acute distress  HENT:  Head: Normocephalic and atraumatic.  Eyes: Pupils are equal, round, and reactive to light. EOM are normal.  Neck: Normal range of motion.  Cardiovascular: Normal rate, regular rhythm and normal heart sounds.   Pulmonary/Chest: Effort normal and breath sounds normal.  Abdominal: Soft. Bowel sounds are normal.  Musculoskeletal: He exhibits edema (b/l +2).  Neurological: He is alert and oriented to  person, place, and time.  Skin: Skin is warm and dry.     CMP Latest Ref Rng & Units 05/15/2017  Glucose 65 - 99 mg/dL 115(H)  BUN 6 - 20 mg/dL 15  Creatinine 0.61 - 1.24 mg/dL 0.57(L)  Sodium 135 - 145 mmol/L 134(L)  Potassium 3.5 - 5.1 mmol/L 4.0  Chloride 101 - 111 mmol/L 102  CO2 22 - 32 mmol/L 26  Calcium 8.9 - 10.3 mg/dL 8.3(L)  Total Protein 6.5 - 8.1 g/dL 5.2(L)  Total Bilirubin 0.3 - 1.2 mg/dL 0.4  Alkaline Phos 38 - 126 U/L 105  AST 15 - 41 U/L 24  ALT 17 - 63 U/L 13(L)   CBC Latest Ref Rng & Units 05/15/2017  WBC 3.8 - 10.6 K/uL 6.1  Hemoglobin 13.0 - 18.0 g/dL 8.9(L)  Hematocrit 40.0 - 52.0 % 26.2(L)  Platelets 150 - 440 K/uL 261     Assessment and plan-  Patient  is a 71 y.o.malewith Stage IV GE junction adenocarcinoma with liver metastases here for on treatment assessment prior to cycle 4 of FOLFOX.   Covering Dr.Rao. 1. Counts ok to proceed with cycle 4 of FOLFOX. rtc in 2 weeks with labs- cbc, cmp  for cycle 5 of FOLFOX. Baseline CEA has not been elevated  2. Anemia- multifactorial, chemotherapy myelosuppression and due to GI blood loss from tumor. S/p 2 doses of feraheme. Repeat iron/TIBC and ferritin.   3. Neoplasm related pain- continue hydrocodone prn and morphine ER15 mg BID. Pain appears to be well controlled.   4. Cancer associated cachexia and malnutrition- continue to monitor. Patient does not want to see dietician at this point. Suggests him to take Ensure supplements as patient reports that he has them available at home. Also talked about appetite stimulant options. Patient currently not interested.   5. Leg swelling- likely due to malnutrtion/ low albumin. Keep legs elevated. Compression stockings.  Dr.Rao will see him in 2 weeks time with cbc, cmp for cycle 5 of FOLFOLX. Scans after cycle 6     Visit Diagnosis 1. Esophageal cancer, stage IV ([place])   2. Liver metastases ([place])   3. Iron deficiency anemia due to chronic blood loss   4. Goals of care, counseling/discussion   5. Cachexia [facility])     Dr. Earlie Server, MD, PhD [facility] Pager- [phone] 05/15/2017

## 2017-05-15 NOTE — Progress Notes (Signed)
Patient here today for follow up.   

## 2017-05-17 ENCOUNTER — Inpatient Hospital Stay: Payer: PPO

## 2017-05-17 VITALS — BP 144/79 | HR 75 | Temp 95.9°F | Resp 16

## 2017-05-17 DIAGNOSIS — C799 Secondary malignant neoplasm of unspecified site: Secondary | ICD-10-CM

## 2017-05-17 DIAGNOSIS — Z5111 Encounter for antineoplastic chemotherapy: Secondary | ICD-10-CM | POA: Diagnosis not present

## 2017-05-17 DIAGNOSIS — C159 Malignant neoplasm of esophagus, unspecified: Secondary | ICD-10-CM

## 2017-05-17 MED ORDER — HEPARIN SOD (PORK) LOCK FLUSH 100 UNIT/ML IV SOLN
500.0000 [IU] | Freq: Once | INTRAVENOUS | Status: AC | PRN
  Administered 2017-05-17: 500 [IU]
  Filled 2017-05-17: qty 5

## 2017-05-17 MED ORDER — SODIUM CHLORIDE 0.9% FLUSH
10.0000 mL | INTRAVENOUS | Status: DC | PRN
  Administered 2017-05-17: 10 mL
  Filled 2017-05-17: qty 10

## 2017-05-27 DIAGNOSIS — C159 Malignant neoplasm of esophagus, unspecified: Secondary | ICD-10-CM | POA: Diagnosis not present

## 2017-05-29 ENCOUNTER — Encounter: Payer: Self-pay | Admitting: Oncology

## 2017-05-29 ENCOUNTER — Inpatient Hospital Stay: Payer: PPO

## 2017-05-29 ENCOUNTER — Telehealth: Payer: Self-pay | Admitting: *Deleted

## 2017-05-29 ENCOUNTER — Inpatient Hospital Stay (HOSPITAL_BASED_OUTPATIENT_CLINIC_OR_DEPARTMENT_OTHER): Payer: PPO | Admitting: Oncology

## 2017-05-29 ENCOUNTER — Other Ambulatory Visit: Payer: Self-pay | Admitting: *Deleted

## 2017-05-29 VITALS — BP 148/79 | HR 84 | Temp 97.1°F | Resp 16 | Wt 110.6 lb

## 2017-05-29 DIAGNOSIS — R64 Cachexia: Secondary | ICD-10-CM

## 2017-05-29 DIAGNOSIS — C159 Malignant neoplasm of esophagus, unspecified: Secondary | ICD-10-CM | POA: Diagnosis not present

## 2017-05-29 DIAGNOSIS — D500 Iron deficiency anemia secondary to blood loss (chronic): Secondary | ICD-10-CM

## 2017-05-29 DIAGNOSIS — C787 Secondary malignant neoplasm of liver and intrahepatic bile duct: Secondary | ICD-10-CM | POA: Diagnosis not present

## 2017-05-29 DIAGNOSIS — M7989 Other specified soft tissue disorders: Secondary | ICD-10-CM | POA: Diagnosis not present

## 2017-05-29 DIAGNOSIS — Z79899 Other long term (current) drug therapy: Secondary | ICD-10-CM | POA: Diagnosis not present

## 2017-05-29 DIAGNOSIS — E46 Unspecified protein-calorie malnutrition: Secondary | ICD-10-CM

## 2017-05-29 DIAGNOSIS — C799 Secondary malignant neoplasm of unspecified site: Secondary | ICD-10-CM

## 2017-05-29 DIAGNOSIS — G893 Neoplasm related pain (acute) (chronic): Secondary | ICD-10-CM

## 2017-05-29 DIAGNOSIS — D6481 Anemia due to antineoplastic chemotherapy: Secondary | ICD-10-CM | POA: Diagnosis not present

## 2017-05-29 DIAGNOSIS — R634 Abnormal weight loss: Secondary | ICD-10-CM | POA: Diagnosis not present

## 2017-05-29 DIAGNOSIS — Z87891 Personal history of nicotine dependence: Secondary | ICD-10-CM | POA: Diagnosis not present

## 2017-05-29 DIAGNOSIS — J449 Chronic obstructive pulmonary disease, unspecified: Secondary | ICD-10-CM

## 2017-05-29 DIAGNOSIS — Z5111 Encounter for antineoplastic chemotherapy: Secondary | ICD-10-CM

## 2017-05-29 LAB — CBC WITH DIFFERENTIAL/PLATELET
BASOS ABS: 0.1 10*3/uL (ref 0–0.1)
Basophils Relative: 2 %
EOS PCT: 1 %
Eosinophils Absolute: 0 10*3/uL (ref 0–0.7)
HCT: 25.4 % — ABNORMAL LOW (ref 40.0–52.0)
Hemoglobin: 8.7 g/dL — ABNORMAL LOW (ref 13.0–18.0)
LYMPHS PCT: 15 %
Lymphs Abs: 0.6 10*3/uL — ABNORMAL LOW (ref 1.0–3.6)
MCH: 28.9 pg (ref 26.0–34.0)
MCHC: 34.4 g/dL (ref 32.0–36.0)
MCV: 84.2 fL (ref 80.0–100.0)
MONO ABS: 0.9 10*3/uL (ref 0.2–1.0)
Monocytes Relative: 23 %
Neutro Abs: 2.4 10*3/uL (ref 1.4–6.5)
Neutrophils Relative %: 59 %
PLATELETS: 206 10*3/uL (ref 150–440)
RBC: 3.02 MIL/uL — ABNORMAL LOW (ref 4.40–5.90)
RDW: 21.5 % — AB (ref 11.5–14.5)
WBC: 4 10*3/uL (ref 3.8–10.6)

## 2017-05-29 LAB — COMPREHENSIVE METABOLIC PANEL
ALK PHOS: 102 U/L (ref 38–126)
ALT: 14 U/L — ABNORMAL LOW (ref 17–63)
ANION GAP: 7 (ref 5–15)
AST: 25 U/L (ref 15–41)
Albumin: 2.8 g/dL — ABNORMAL LOW (ref 3.5–5.0)
BILIRUBIN TOTAL: 0.4 mg/dL (ref 0.3–1.2)
BUN: 13 mg/dL (ref 6–20)
CO2: 26 mmol/L (ref 22–32)
Calcium: 8.4 mg/dL — ABNORMAL LOW (ref 8.9–10.3)
Chloride: 100 mmol/L — ABNORMAL LOW (ref 101–111)
Creatinine, Ser: 0.57 mg/dL — ABNORMAL LOW (ref 0.61–1.24)
Glucose, Bld: 102 mg/dL — ABNORMAL HIGH (ref 65–99)
Potassium: 4 mmol/L (ref 3.5–5.1)
Sodium: 133 mmol/L — ABNORMAL LOW (ref 135–145)
TOTAL PROTEIN: 5.1 g/dL — AB (ref 6.5–8.1)

## 2017-05-29 LAB — IRON AND TIBC
IRON: 31 ug/dL — AB (ref 45–182)
Saturation Ratios: 14 % — ABNORMAL LOW (ref 17.9–39.5)
TIBC: 224 ug/dL — AB (ref 250–450)
UIBC: 193 ug/dL

## 2017-05-29 LAB — FERRITIN: Ferritin: 968 ng/mL — ABNORMAL HIGH (ref 24–336)

## 2017-05-29 MED ORDER — LEUCOVORIN CALCIUM INJECTION 350 MG
650.0000 mg | Freq: Once | INTRAVENOUS | Status: AC
  Administered 2017-05-29: 650 mg via INTRAVENOUS
  Filled 2017-05-29: qty 32.5

## 2017-05-29 MED ORDER — SODIUM CHLORIDE 0.9 % IV SOLN
2400.0000 mg/m2 | INTRAVENOUS | Status: DC
  Administered 2017-05-29: 3900 mg via INTRAVENOUS
  Filled 2017-05-29: qty 78

## 2017-05-29 MED ORDER — HEPARIN SOD (PORK) LOCK FLUSH 100 UNIT/ML IV SOLN: 500.0000 [IU] | Freq: Once | INTRAVENOUS | Status: DC

## 2017-05-29 MED ORDER — DEXTROSE 5 % IV SOLN
Freq: Once | INTRAVENOUS | Status: AC
  Administered 2017-05-29: 10:00:00 via INTRAVENOUS
  Filled 2017-05-29: qty 1000

## 2017-05-29 MED ORDER — SODIUM CHLORIDE 0.9% FLUSH
10.0000 mL | INTRAVENOUS | Status: DC | PRN
  Administered 2017-05-29: 10 mL via INTRAVENOUS
  Filled 2017-05-29: qty 10

## 2017-05-29 MED ORDER — DEXAMETHASONE SODIUM PHOSPHATE 10 MG/ML IJ SOLN
10.0000 mg | Freq: Once | INTRAMUSCULAR | Status: AC
Start: 2017-05-29 — End: 2017-05-29
  Administered 2017-05-29: 10 mg via INTRAVENOUS
  Filled 2017-05-29: qty 1

## 2017-05-29 MED ORDER — PROMETHAZINE HCL 12.5 MG PO TABS: 12.5000 mg | ORAL_TABLET | Freq: Four times a day (QID) | ORAL | 1 refills | Status: DC

## 2017-05-29 MED ORDER — PALONOSETRON HCL INJECTION 0.25 MG/5ML
0.2500 mg | Freq: Once | INTRAVENOUS | Status: AC
  Administered 2017-05-29: 0.25 mg via INTRAVENOUS
  Filled 2017-05-29: qty 5

## 2017-05-29 MED ORDER — FLUOROURACIL CHEMO INJECTION 2.5 GM/50ML
400.0000 mg/m2 | Freq: Once | INTRAVENOUS | Status: AC
  Administered 2017-05-29: 650 mg via INTRAVENOUS
  Filled 2017-05-29: qty 13

## 2017-05-29 MED ORDER — OXALIPLATIN CHEMO INJECTION 100 MG/20ML
85.0000 mg/m2 | Freq: Once | INTRAVENOUS | Status: AC
  Administered 2017-05-29: 140 mg via INTRAVENOUS
  Filled 2017-05-29: qty 20

## 2017-05-29 NOTE — Telephone Encounter (Signed)
Called home and left message that pt needs to have feraheme because his iron levels are low and we would like to give it to him when he comes to take pump off. I also called his wife and she states she will let him know and he will be fine to get iron treatment when he comes to get treatment.

## 2017-05-29 NOTE — Progress Notes (Signed)
Patient reports that he had Nausea/vomiting his vomit had frank red blood in his emisis times three, sleep disturbance. States the side effects from his treatment were worse.

## 2017-05-29 NOTE — Progress Notes (Signed)
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   Chief complaint/ Reason for visit- on treatment assessment prior to cycle 5 of folfox  Heme/Onc history:patient is a 71 year old Caucasian male who presented to the ER in June 2018with symptoms of worsening abdominal pain. He has been havving dark tarry stools for about 3 weeks prior to that. He is also had a significant weight loss of about 10 poundsover the last few months. CT abdomen done in the ER showed multiple liver metastases as well as a mass in the gastric cardia projecting into the lumen as well as adenopathy in the gastrohepatic ligament  EGD showed: Large fungating mass with bleeding at the GE junction 48 cm from the incisors. Mass was partially obstructing and partially circumferential involving one half of the lumen. Large fungating infiltrative and ulcerated and partially circumferential mass involving one half of the lumen circumference bleeding and stigmata of recent bleeding also found in the gastric cardia  Stomach mass cardia biopsy showed poorly differentiated adenocarcinoma. Her 2 neu negativeand PDL1 was expressed  CT thorax showed 7 mm lingular nodule. No evidence of bone mets on bone scan. L2 lucent lesion likely hemangioma  1st line palliative FOLFOX started on 03/27/17. PDL1 expression Positive   Interval history- leg swelling has improved. Reports 1 episode of nausea and vomiting after chemo which was self limited. Pain well controlled with morphine and oxycodone. Denies any constipation  ECOG PS- 1 Pain scale- 1 Opioid associated constipation- no  Review of systems- Review of Systems   Constitutional: Positive for malaise/fatigue. Negative for chills, fever and weight loss.  HENT: Negative for congestion, ear discharge and nosebleeds.   Eyes: Negative for blurred vision.  Respiratory: Negative for cough, hemoptysis, sputum production, shortness of breath and wheezing.   Cardiovascular: Negative for chest pain, palpitations, orthopnea and claudication.  Gastrointestinal: Positive for nausea and vomiting. Negative for abdominal pain, blood in stool, constipation, diarrhea, heartburn and melena.  Genitourinary: Negative for dysuria, flank pain, frequency, hematuria and urgency.  Musculoskeletal: Negative for back pain, joint pain and myalgias.  Skin: Negative for rash.  Neurological: Negative for dizziness, tingling, focal weakness, seizures, weakness and headaches.  Endo/Heme/Allergies: Does not bruise/bleed easily.  Psychiatric/Behavioral: Negative for depression and suicidal ideas. The patient does not have insomnia.      No Known Allergies   Past Medical History:  Diagnosis Date  . COPD (chronic obstructive pulmonary disease) ([place])      Past Surgical History:  Procedure Laterality Date  . ESOPHAGOGASTRODUODENOSCOPY (EGD) WITH PROPOFOL N/A 03/10/2017   Procedure: ESOPHAGOGASTRODUODENOSCOPY (EGD) WITH PROPOFOL;  Surgeon: Wilford Corner, MD;  Location: [facility] ENDOSCOPY;  Service: Endoscopy;  Laterality: N/A;  . KNEE ARTHROSCOPY  1991  . PORTA CATH INSERTION N/A 03/21/2017   Procedure: Glori Luis Cath Insertion;  Surgeon: Algernon Huxley, MD;  Location: [facility] LAB;  Service: Cardiovascular;  Laterality: N/A;  . TONSILLECTOMY      Social History   Social History  . Marital status: Married    Spouse name: N/A  . Number of children: N/A  . Years of education: N/A   Occupational History  . Not on file.   Social History Main Topics  . Smoking  status: Former Smoker    Quit date: 06/2010  . Smokeless tobacco: Never Used  . Alcohol use No  . Drug use: No  .  Sexual activity: Not on file   Other Topics Concern  . Not on file   Social History Narrative  . No narrative on file    No family history on file.   Current Outpatient Prescriptions:  .  dexamethasone (DECADRON) 4 MG tablet, Take 2 tablets (8 mg total) by mouth daily. Start the day after chemotherapy for 2 days. Take with food., Disp: 30 tablet, Rfl: 1 .  furosemide (LASIX) 20 MG tablet, Take 1 tablet (20 mg total) by mouth daily as needed., Disp: 30 tablet, Rfl: 0 .  HYDROcodone-acetaminophen (NORCO/VICODIN) 5-325 MG tablet, Take 1 tablet by mouth every 4 (four) hours as needed for moderate pain or severe pain., Disp: 60 tablet, Rfl: 0 .  lidocaine-prilocaine (EMLA) cream, Apply to affected area once, Disp: 30 g, Rfl: 3 .  LORazepam (ATIVAN) 0.5 MG tablet, Take 1 tablet (0.5 mg total) by mouth every 6 (six) hours as needed (Nausea or vomiting). (Patient not taking: Reported on 04/12/2017), Disp: 30 tablet, Rfl: 0 .  morphine (MS CONTIN) 15 MG 12 hr tablet, Take 1 tablet (15 mg total) by mouth every 12 (twelve) hours., Disp: 60 tablet, Rfl: 0 .  ondansetron (ZOFRAN) 4 MG tablet, Take 1 tablet (4 mg total) by mouth every 6 (six) hours as needed for nausea. (Patient not taking: Reported on 04/12/2017), Disp: 20 tablet, Rfl: 0 .  ondansetron (ZOFRAN) 8 MG tablet, Take 1 tablet (8 mg total) by mouth 2 (two) times daily as needed for refractory nausea / vomiting. Start on day 3 after chemotherapy. (Patient not taking: Reported on 04/12/2017), Disp: 30 tablet, Rfl: 1 .  pantoprazole (PROTONIX) 40 MG tablet, Take 1 tablet (40 mg total) by mouth 2 (two) times daily., Disp: 60 tablet, Rfl: 2 .  prochlorperazine (COMPAZINE) 10 MG tablet, Take 1 tablet (10 mg total) by mouth every 6 (six) hours as needed (Nausea or vomiting). (Patient not taking: Reported on 05/01/2017), Disp: 30 tablet, Rfl: 1 .  promethazine (PHENERGAN) 12.5 MG tablet, Take 1 tablet (12.5 mg total) by mouth 4 (four) times daily., Disp: 30  tablet, Rfl: 1 No current facility-administered medications for this visit.   Facility-Administered Medications Ordered in Other Visits:  .  heparin lock flush 100 unit/mL, 500 Units, Intravenous, Once, Randa Evens C, MD .  sodium chloride flush (NS) 0.9 % injection 10 mL, 10 mL, Intracatheter, PRN, Sindy Guadeloupe, MD, 10 mL at 04/14/17 1055 .  sodium chloride flush (NS) 0.9 % injection 10 mL, 10 mL, Intravenous, PRN, Sindy Guadeloupe, MD, 10 mL at 05/29/17 0836  Physical exam:  Vitals:   05/29/17 0850  BP: (!) 148/79  Pulse: 84  Resp: 16  Temp: (!) 97.1 F (36.2 C)  TempSrc: Tympanic  SpO2: 97%  Weight: 110 lb 9.6 oz (50.2 kg)   Physical Exam  Constitutional: He is oriented to person, place, and time.  Thin and cachectic, appears in no acute distress  HENT:  Head: Normocephalic and atraumatic.  Eyes: Pupils are equal, round, and reactive to light. EOM are normal.  Neck: Normal range of motion.  Cardiovascular: Normal rate, regular rhythm and normal heart sounds.   Pulmonary/Chest: Effort normal and breath sounds normal.  Abdominal: Soft. Bowel sounds are normal.  Musculoskeletal: He exhibits edema (improved).  Neurological: He is alert and oriented to person, place,  and time.  Skin: Skin is warm and dry.     CMP Latest Ref Rng & Units 05/15/2017  Glucose 65 - 99 mg/dL 115(H)  BUN 6 - 20 mg/dL 15  Creatinine 0.61 - 1.24 mg/dL 0.57(L)  Sodium 135 - 145 mmol/L 134(L)  Potassium 3.5 - 5.1 mmol/L 4.0  Chloride 101 - 111 mmol/L 102  CO2 22 - 32 mmol/L 26  Calcium 8.9 - 10.3 mg/dL 8.3(L)  Total Protein 6.5 - 8.1 g/dL 5.2(L)  Total Bilirubin 0.3 - 1.2 mg/dL 0.4  Alkaline Phos 38 - 126 U/L 105  AST 15 - 41 U/L 24  ALT 17 - 63 U/L 13(L)   CBC Latest Ref Rng & Units 05/15/2017  WBC 3.8 - 10.6 K/uL 6.1  Hemoglobin 13.0 - 18.0 g/dL 8.9(L)  Hematocrit 40.0 - 52.0 % 26.2(L)  Platelets 150 - 440 K/uL 261      Assessment and plan- Patient is a 71 y.o.malewith Stage IV GE  junction adenocarcinoma with liver metastases here for on treatment assessment prior to cycle 3 of FOLFOX  1. Counts ok to proceed with cycle 5of FOLFOX. rtc in 2 weeks with labs- cbc, cmp  for cycle 6 of FOLFOX scans after cycle 6  2. Iron deficiency anemia- due to upper GI bleeding from tumor. S/p 2 doses of feraheme. H/H stable. Repeat iron studies show evidence of combined iron deficiency and anemia of chronic disease. Will proceed woth 2 more doses of feraheme  3. Neoplasm related pain- continue hydrocodone prn and morphine ER15 mg BID  4. Cancer associated cachexia and malnutrition- continue to monitor. Patient would not like to see a dietician at this point.   5. Leg swelling- likely due to malnutrtion/ low albumin. Keep legs elevated. Compression stockings.he has not required to use lasix  rtc in 2 weeks with cbc/ cmp for cycle 6 of FOLFOX    Visit Diagnosis 1. Esophageal cancer, stage IV ([employer])   2. Liver metastases ([place])   3. Encounter for antineoplastic chemotherapy   4. Cachexia ([place])   5. Iron deficiency anemia due to chronic blood loss      Dr. Randa Evens, MD, MPH [facility] at [name] Pager- [phone] 05/29/2017 1:05 PM

## 2017-05-31 ENCOUNTER — Inpatient Hospital Stay: Payer: PPO

## 2017-05-31 VITALS — BP 154/72 | HR 88 | Resp 20

## 2017-05-31 DIAGNOSIS — D500 Iron deficiency anemia secondary to blood loss (chronic): Secondary | ICD-10-CM

## 2017-05-31 DIAGNOSIS — C159 Malignant neoplasm of esophagus, unspecified: Secondary | ICD-10-CM

## 2017-05-31 DIAGNOSIS — C799 Secondary malignant neoplasm of unspecified site: Principal | ICD-10-CM

## 2017-05-31 DIAGNOSIS — Z5111 Encounter for antineoplastic chemotherapy: Secondary | ICD-10-CM | POA: Diagnosis not present

## 2017-05-31 MED ORDER — HEPARIN SOD (PORK) LOCK FLUSH 100 UNIT/ML IV SOLN
500.0000 [IU] | Freq: Once | INTRAVENOUS | Status: AC | PRN
  Administered 2017-05-31: 500 [IU]
  Filled 2017-05-31: qty 5

## 2017-05-31 MED ORDER — SODIUM CHLORIDE 0.9% FLUSH
10.0000 mL | INTRAVENOUS | Status: DC | PRN
  Filled 2017-05-31: qty 10

## 2017-05-31 MED ORDER — SODIUM CHLORIDE 0.9 % IV SOLN
510.0000 mg | Freq: Once | INTRAVENOUS | Status: AC
  Administered 2017-05-31: 510 mg via INTRAVENOUS
  Filled 2017-05-31: qty 17

## 2017-05-31 MED ORDER — SODIUM CHLORIDE 0.9 % IV SOLN
Freq: Once | INTRAVENOUS | Status: AC
  Administered 2017-05-31: 15:00:00 via INTRAVENOUS
  Filled 2017-05-31: qty 4

## 2017-05-31 MED ORDER — SODIUM CHLORIDE 0.9 % IV SOLN
Freq: Once | INTRAVENOUS | Status: AC
  Administered 2017-05-31: 14:00:00 via INTRAVENOUS
  Filled 2017-05-31: qty 1000

## 2017-06-12 ENCOUNTER — Inpatient Hospital Stay: Payer: PPO

## 2017-06-12 ENCOUNTER — Other Ambulatory Visit: Payer: Self-pay | Admitting: *Deleted

## 2017-06-12 ENCOUNTER — Telehealth: Payer: Self-pay | Admitting: *Deleted

## 2017-06-12 ENCOUNTER — Inpatient Hospital Stay: Payer: PPO | Attending: Oncology | Admitting: Oncology

## 2017-06-12 VITALS — BP 138/72 | HR 88 | Temp 95.8°F | Resp 18 | Wt 108.1 lb

## 2017-06-12 DIAGNOSIS — C787 Secondary malignant neoplasm of liver and intrahepatic bile duct: Secondary | ICD-10-CM | POA: Diagnosis not present

## 2017-06-12 DIAGNOSIS — E43 Unspecified severe protein-calorie malnutrition: Secondary | ICD-10-CM | POA: Insufficient documentation

## 2017-06-12 DIAGNOSIS — C159 Malignant neoplasm of esophagus, unspecified: Secondary | ICD-10-CM

## 2017-06-12 DIAGNOSIS — Z87891 Personal history of nicotine dependence: Secondary | ICD-10-CM | POA: Insufficient documentation

## 2017-06-12 DIAGNOSIS — E46 Unspecified protein-calorie malnutrition: Secondary | ICD-10-CM | POA: Diagnosis not present

## 2017-06-12 DIAGNOSIS — C160 Malignant neoplasm of cardia: Secondary | ICD-10-CM | POA: Insufficient documentation

## 2017-06-12 DIAGNOSIS — D509 Iron deficiency anemia, unspecified: Secondary | ICD-10-CM | POA: Diagnosis not present

## 2017-06-12 DIAGNOSIS — J449 Chronic obstructive pulmonary disease, unspecified: Secondary | ICD-10-CM | POA: Diagnosis not present

## 2017-06-12 DIAGNOSIS — G893 Neoplasm related pain (acute) (chronic): Secondary | ICD-10-CM | POA: Insufficient documentation

## 2017-06-12 DIAGNOSIS — Z5111 Encounter for antineoplastic chemotherapy: Secondary | ICD-10-CM

## 2017-06-12 DIAGNOSIS — R000 Tachycardia, unspecified: Secondary | ICD-10-CM | POA: Diagnosis not present

## 2017-06-12 DIAGNOSIS — R64 Cachexia: Secondary | ICD-10-CM

## 2017-06-12 DIAGNOSIS — Z79899 Other long term (current) drug therapy: Secondary | ICD-10-CM

## 2017-06-12 DIAGNOSIS — D500 Iron deficiency anemia secondary to blood loss (chronic): Secondary | ICD-10-CM

## 2017-06-12 LAB — COMPREHENSIVE METABOLIC PANEL
ALBUMIN: 2.5 g/dL — AB (ref 3.5–5.0)
ALK PHOS: 101 U/L (ref 38–126)
ALT: 13 U/L — ABNORMAL LOW (ref 17–63)
ANION GAP: 4 — AB (ref 5–15)
AST: 25 U/L (ref 15–41)
BILIRUBIN TOTAL: 0.5 mg/dL (ref 0.3–1.2)
BUN: 16 mg/dL (ref 6–20)
CALCIUM: 8.1 mg/dL — AB (ref 8.9–10.3)
CO2: 27 mmol/L (ref 22–32)
Chloride: 102 mmol/L (ref 101–111)
Creatinine, Ser: 0.68 mg/dL (ref 0.61–1.24)
GFR calc Af Amer: >60 mL/min (ref >60)
GLUCOSE: 122 mg/dL — AB (ref 65–99)
Potassium: 3.9 mmol/L (ref 3.5–5.1)
Sodium: 133 mmol/L — ABNORMAL LOW (ref 135–145)
TOTAL PROTEIN: 4.8 g/dL — AB (ref 6.5–8.1)

## 2017-06-12 LAB — CBC WITH DIFFERENTIAL/PLATELET
BASOS PCT: 3 %
Basophils Absolute: 0.1 10*3/uL (ref 0–0.1)
Eosinophils Absolute: 0 10*3/uL (ref 0–0.7)
Eosinophils Relative: 1 %
HEMATOCRIT: 23.4 % — AB (ref 40.0–52.0)
HEMOGLOBIN: 8.1 g/dL — AB (ref 13.0–18.0)
LYMPHS ABS: 0.6 10*3/uL — AB (ref 1.0–3.6)
LYMPHS PCT: 30 %
MCH: 30 pg (ref 26.0–34.0)
MCHC: 34.6 g/dL (ref 32.0–36.0)
MCV: 86.8 fL (ref 80.0–100.0)
MONO ABS: 0.7 10*3/uL (ref 0.2–1.0)
MONOS PCT: 37 %
NEUTROS ABS: 0.5 10*3/uL — AB (ref 1.4–6.5)
NEUTROS PCT: 29 %
Platelets: 183 10*3/uL (ref 150–440)
RBC: 2.7 MIL/uL — ABNORMAL LOW (ref 4.40–5.90)
RDW: 22.8 % — ABNORMAL HIGH (ref 11.5–14.5)
WBC: 1.8 10*3/uL — ABNORMAL LOW (ref 3.8–10.6)

## 2017-06-12 MED ORDER — MEGESTROL ACETATE 40 MG PO TABS: 40.0000 mg | ORAL_TABLET | Freq: Two times a day (BID) | ORAL | 3 refills | Status: DC

## 2017-06-12 MED ORDER — HEPARIN SOD (PORK) LOCK FLUSH 100 UNIT/ML IV SOLN
500.0000 [IU] | Freq: Once | INTRAVENOUS | Status: AC
  Administered 2017-06-12: 500 [IU] via INTRAVENOUS
  Filled 2017-06-12: qty 5

## 2017-06-12 MED ORDER — SODIUM CHLORIDE 0.9% FLUSH
10.0000 mL | Freq: Once | INTRAVENOUS | Status: AC
  Administered 2017-06-12: 10 mL via INTRAVENOUS
  Filled 2017-06-12: qty 10

## 2017-06-12 MED ORDER — MORPHINE SULFATE ER 15 MG PO TBCR: 15.0000 mg | EXTENDED_RELEASE_TABLET | Freq: Two times a day (BID) | ORAL | 0 refills | Status: DC

## 2017-06-12 NOTE — Progress Notes (Signed)
Pt here for follow up. Stated he needs refill of Morphine Sul 15 mg ER 12 h

## 2017-06-12 NOTE — Telephone Encounter (Signed)
Medicine refill

## 2017-06-12 NOTE — Progress Notes (Signed)
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   Chief complaint/ Reason for visit- on treatment assessment prior to cycle 5 of folfox  Heme/Onc history:patient is a 71 year old Caucasian male who presented to the ER in June 2018with symptoms of worsening abdominal pain. He has been havving dark tarry stools for about 3 weeks prior to that. He is also had a significant weight loss of about 10 poundsover the last few months. CT abdomen done in the ER showed multiple liver metastases as well as a mass in the gastric cardia projecting into the lumen as well as adenopathy in the gastrohepatic ligament  EGD showed: Large fungating mass with bleeding at the GE junction 48 cm from the incisors. Mass was partially obstructing and partially circumferential involving one half of the lumen. Large fungating infiltrative and ulcerated and partially circumferential mass involving one half of the lumen circumference bleeding and stigmata of recent bleeding also found in the gastric cardia  Stomach mass cardia biopsy showed poorly differentiated adenocarcinoma. Her 2 neu negativeand PDL1 was expressed  CT thorax showed 7 mm lingular nodule. No evidence of bone mets on bone scan. L2 lucent lesion likely hemangioma  1st line palliative FOLFOX started on 03/27/17. PDL1 expression Positive  Interval history- reports no appetite. He has lost 4 lb in the last month. Feels fatigued. Pain well controlled  ECOG PS- 2 Pain scale- 0 Opioid associated constipation- no  Review of systems- Review of Systems  Constitutional: Positive for malaise/fatigue. Negative for chills, fever and weight  loss.       Lack of appetite  HENT: Negative for congestion, ear discharge and nosebleeds.   Eyes: Negative for blurred vision.  Respiratory: Negative for cough, hemoptysis, sputum production, shortness of breath and wheezing.   Cardiovascular: Negative for chest pain, palpitations, orthopnea and claudication.  Gastrointestinal: Negative for abdominal pain, blood in stool, constipation, diarrhea, heartburn, melena, nausea and vomiting.  Genitourinary: Negative for dysuria, flank pain, frequency, hematuria and urgency.  Musculoskeletal: Negative for back pain, joint pain and myalgias.  Skin: Negative for rash.  Neurological: Positive for weakness. Negative for dizziness, tingling, focal weakness, seizures and headaches.  Endo/Heme/Allergies: Does not bruise/bleed easily.  Psychiatric/Behavioral: Negative for depression and suicidal ideas. The patient does not have insomnia.       No Known Allergies   Past Medical History:  Diagnosis Date  . COPD (chronic obstructive pulmonary disease) ([place])      Past Surgical History:  Procedure Laterality Date  . ESOPHAGOGASTRODUODENOSCOPY (EGD) WITH PROPOFOL N/A 03/10/2017   Procedure: ESOPHAGOGASTRODUODENOSCOPY (EGD) WITH PROPOFOL;  Surgeon: Wilford Corner, MD;  Location: [facility] ENDOSCOPY;  Service: Endoscopy;  Laterality: N/A;  . KNEE ARTHROSCOPY  1991  . PORTA CATH INSERTION N/A 03/21/2017   Procedure: Glori Luis Cath Insertion;  Surgeon: Algernon Huxley, MD;  Location: [facility] LAB;  Service: Cardiovascular;  Laterality: N/A;  . TONSILLECTOMY      Social History   Social History  . Marital status: Married    Spouse name: N/A  . Number of children: N/A  . Years of education: N/A   Occupational History  . Not on file.   Social History Main Topics  . Smoking status: Former  Smoker    Quit date: 06/2010  . Smokeless tobacco: Never Used  . Alcohol use No  . Drug use: No  . Sexual activity: Not on file   Other Topics Concern  . Not  on file   Social History Narrative  . No narrative on file    No family history on file.   Current Outpatient Prescriptions:  .  dexamethasone (DECADRON) 4 MG tablet, Take 2 tablets (8 mg total) by mouth daily. Start the day after chemotherapy for 2 days. Take with food., Disp: 30 tablet, Rfl: 1 .  furosemide (LASIX) 20 MG tablet, Take 1 tablet (20 mg total) by mouth daily as needed., Disp: 30 tablet, Rfl: 0 .  HYDROcodone-acetaminophen (NORCO/VICODIN) 5-325 MG tablet, Take 1 tablet by mouth every 4 (four) hours as needed for moderate pain or severe pain., Disp: 60 tablet, Rfl: 0 .  lidocaine-prilocaine (EMLA) cream, Apply to affected area once, Disp: 30 g, Rfl: 3 .  LORazepam (ATIVAN) 0.5 MG tablet, Take 1 tablet (0.5 mg total) by mouth every 6 (six) hours as needed (Nausea or vomiting)., Disp: 30 tablet, Rfl: 0 .  morphine (MS CONTIN) 15 MG 12 hr tablet, Take 1 tablet (15 mg total) by mouth every 12 (twelve) hours., Disp: 60 tablet, Rfl: 0 .  ondansetron (ZOFRAN) 4 MG tablet, Take 1 tablet (4 mg total) by mouth every 6 (six) hours as needed for nausea., Disp: 20 tablet, Rfl: 0 .  ondansetron (ZOFRAN) 8 MG tablet, Take 1 tablet (8 mg total) by mouth 2 (two) times daily as needed for refractory nausea / vomiting. Start on day 3 after chemotherapy., Disp: 30 tablet, Rfl: 1 .  pantoprazole (PROTONIX) 40 MG tablet, Take 1 tablet (40 mg total) by mouth 2 (two) times daily., Disp: 60 tablet, Rfl: 2 .  prochlorperazine (COMPAZINE) 10 MG tablet, Take 1 tablet (10 mg total) by mouth every 6 (six) hours as needed (Nausea or vomiting)., Disp: 30 tablet, Rfl: 1 .  promethazine (PHENERGAN) 12.5 MG tablet, Take 1 tablet (12.5 mg total) by mouth 4 (four) times daily., Disp: 30 tablet, Rfl: 1 No current facility-administered medications for this visit.   Facility-Administered Medications Ordered in Other Visits:  .  heparin lock flush 100 unit/mL, 500 Units, Intravenous, Once, Randa Evens C, MD .  sodium  chloride flush (NS) 0.9 % injection 10 mL, 10 mL, Intracatheter, PRN, Sindy Guadeloupe, MD, 10 mL at 04/14/17 1055  Physical exam:  Vitals:   06/12/17 0839  BP: 138/72  Pulse: 88  Resp: 18  Temp: (!) 95.8 F (35.4 C)  TempSrc: Tympanic  Weight: 108 lb 1.6 oz (49 kg)   Physical Exam  Constitutional: He is oriented to person, place, and time.  Thin and malnourished. In no acute distress  HENT:  Head: Normocephalic and atraumatic.  Eyes: Pupils are equal, round, and reactive to light. EOM are normal.  Neck: Normal range of motion.  Cardiovascular: Normal rate, regular rhythm and normal heart sounds.   Pulmonary/Chest: Effort normal and breath sounds normal.  Abdominal: Soft. Bowel sounds are normal.  Musculoskeletal: He exhibits edema.  Neurological: He is alert and oriented to person, place, and time.  Skin: Skin is warm and dry.     CMP Latest Ref Rng & Units 05/29/2017  Glucose 65 - 99 mg/dL 102(H)  BUN 6 - 20 mg/dL 13  Creatinine 0.61 - 1.24 mg/dL 0.57(L)  Sodium 135 - 145 mmol/L 133(L)  Potassium 3.5 - 5.1 mmol/L 4.0  Chloride 101 - 111 mmol/L 100(L)  CO2 22 - 32 mmol/L 26  Calcium 8.9 - 10.3 mg/dL 8.4(L)  Total Protein 6.5 - 8.1 g/dL 5.1(L)  Total Bilirubin 0.3 - 1.2 mg/dL 0.4  Alkaline Phos 38 - 126 U/L 102  AST 15 - 41 U/L 25  ALT 17 - 63 U/L 14(L)   CBC Latest Ref Rng & Units 06/12/2017  WBC 3.8 - 10.6 K/uL 1.8(L)  Hemoglobin 13.0 - 18.0 g/dL 8.1(L)  Hematocrit 40.0 - 52.0 % 23.4(L)  Platelets 150 - 440 K/uL 183     No results found.   Assessment and plan- Patient is a 71 y.o. male with Stage IV GE junction adenocarcinoma with liver metastases here for on treatment assessment prior to cycle 6 of FOLFOX  1. Wbc 1.8 today with anc of 500. No chemo today. rtc in 2 weeks with cbc and cmp and depending on counts he will get chemotherapy. May need neulasta. Neutropenic precautions reviewed.  2. Iron deficiency anemia- s/p 3 doses of IV iron continue to monitor.  He has significant normocytic anemia today. Likely from chemotherapy. Continue to monitor. No need for transfusion today. Holding chemo  3. Neoplasm related pain- continue hydrocodone prn and morphine ER15 mg BID. Morphine script refilled  4. Cancer associated cachexia and malnutrition- megace has been prescribed. May need prior auth   rtc in 2 weeks with cbc/ cmp for cycle 6 of FOLFOX    Visit Diagnosis 1. Esophageal cancer, stage IV ([place])   2. Liver metastases ([place])   3. Iron deficiency anemia due to chronic blood loss   4. Neoplasm related pain   5. Cachexia [facility])      Dr. Randa Evens, MD, MPH [facility] at [facility] Pager- [phone] 06/12/2017 9:09 AM

## 2017-06-14 ENCOUNTER — Inpatient Hospital Stay: Payer: PPO

## 2017-06-25 ENCOUNTER — Telehealth: Payer: Self-pay | Admitting: *Deleted

## 2017-06-25 DIAGNOSIS — C159 Malignant neoplasm of esophagus, unspecified: Secondary | ICD-10-CM

## 2017-06-25 MED ORDER — PROMETHAZINE HCL 12.5 MG PO TABS: 12.5000 mg | ORAL_TABLET | Freq: Four times a day (QID) | ORAL | 1 refills | Status: DC

## 2017-06-25 NOTE — Telephone Encounter (Signed)
Wife called for a refill of promethazine and handicap sticker . They would like to pick up the handicap form tom. I called wife and she told her that I sent in nausea med to pharmacy and I would give them the handicap paper tom. When they came and she was agreeable to plan

## 2017-06-26 ENCOUNTER — Ambulatory Visit
Admission: RE | Admit: 2017-06-26 | Discharge: 2017-06-26 | Disposition: A | Discharge status: Home / Self Care | Payer: PPO | Source: Ambulatory Visit | Attending: Radiation Oncology | Admitting: Radiation Oncology

## 2017-06-26 ENCOUNTER — Inpatient Hospital Stay (HOSPITAL_BASED_OUTPATIENT_CLINIC_OR_DEPARTMENT_OTHER): Payer: PPO | Admitting: Oncology

## 2017-06-26 ENCOUNTER — Other Ambulatory Visit: Payer: Self-pay | Admitting: *Deleted

## 2017-06-26 ENCOUNTER — Inpatient Hospital Stay: Payer: PPO

## 2017-06-26 ENCOUNTER — Ambulatory Visit
Admission: RE | Admit: 2017-06-26 | Discharge: 2017-06-26 | Disposition: A | Discharge status: Home / Self Care | Payer: PPO | Source: Ambulatory Visit | Attending: Oncology | Admitting: Oncology

## 2017-06-26 VITALS — BP 118/68 | HR 82 | Temp 98.0°F | Resp 20

## 2017-06-26 VITALS — BP 113/69 | HR 139 | Temp 92.9°F | Resp 20 | Wt 109.0 lb

## 2017-06-26 DIAGNOSIS — J449 Chronic obstructive pulmonary disease, unspecified: Secondary | ICD-10-CM

## 2017-06-26 DIAGNOSIS — R059 Cough, unspecified: Secondary | ICD-10-CM

## 2017-06-26 DIAGNOSIS — Z87891 Personal history of nicotine dependence: Secondary | ICD-10-CM

## 2017-06-26 DIAGNOSIS — D649 Anemia, unspecified: Secondary | ICD-10-CM

## 2017-06-26 DIAGNOSIS — E43 Unspecified severe protein-calorie malnutrition: Secondary | ICD-10-CM

## 2017-06-26 DIAGNOSIS — D509 Iron deficiency anemia, unspecified: Secondary | ICD-10-CM | POA: Diagnosis not present

## 2017-06-26 DIAGNOSIS — B59 Pneumocystosis: Secondary | ICD-10-CM

## 2017-06-26 DIAGNOSIS — C160 Malignant neoplasm of cardia: Secondary | ICD-10-CM

## 2017-06-26 DIAGNOSIS — C155 Malignant neoplasm of lower third of esophagus: Secondary | ICD-10-CM

## 2017-06-26 DIAGNOSIS — C787 Secondary malignant neoplasm of liver and intrahepatic bile duct: Secondary | ICD-10-CM | POA: Diagnosis not present

## 2017-06-26 DIAGNOSIS — C159 Malignant neoplasm of esophagus, unspecified: Secondary | ICD-10-CM

## 2017-06-26 DIAGNOSIS — R64 Cachexia: Secondary | ICD-10-CM

## 2017-06-26 DIAGNOSIS — Z79899 Other long term (current) drug therapy: Secondary | ICD-10-CM

## 2017-06-26 DIAGNOSIS — R05 Cough: Secondary | ICD-10-CM

## 2017-06-26 DIAGNOSIS — E46 Unspecified protein-calorie malnutrition: Secondary | ICD-10-CM

## 2017-06-26 DIAGNOSIS — G893 Neoplasm related pain (acute) (chronic): Secondary | ICD-10-CM | POA: Diagnosis not present

## 2017-06-26 DIAGNOSIS — R000 Tachycardia, unspecified: Secondary | ICD-10-CM

## 2017-06-26 DIAGNOSIS — Z510 Encounter for antineoplastic radiation therapy: Secondary | ICD-10-CM | POA: Insufficient documentation

## 2017-06-26 DIAGNOSIS — R1010 Upper abdominal pain, unspecified: Secondary | ICD-10-CM

## 2017-06-26 LAB — URINALYSIS, COMPLETE (UACMP) WITH MICROSCOPIC
BACTERIA UA: NONE SEEN
Bilirubin Urine: NEGATIVE
GLUCOSE, UA: NEGATIVE mg/dL
HGB URINE DIPSTICK: NEGATIVE
Ketones, ur: NEGATIVE mg/dL
LEUKOCYTES UA: NEGATIVE
NITRITE: NEGATIVE
PH: 5 (ref 5.0–8.0)
PROTEIN: NEGATIVE mg/dL
RBC / HPF: NONE SEEN RBC/hpf (ref 0–5)
SPECIFIC GRAVITY, URINE: 1.025 (ref 1.005–1.030)
Squamous Epithelial / LPF: NONE SEEN

## 2017-06-26 LAB — CBC WITH DIFFERENTIAL/PLATELET
BASOS ABS: 0.1 10*3/uL (ref 0–0.1)
Basophils Relative: 0 %
Eosinophils Absolute: 0 10*3/uL (ref 0–0.7)
Eosinophils Relative: 0 %
HEMATOCRIT: 20.4 % — AB (ref 40.0–52.0)
Hemoglobin: 6.8 g/dL — ABNORMAL LOW (ref 13.0–18.0)
LYMPHS ABS: 1 10*3/uL (ref 1.0–3.6)
LYMPHS PCT: 5 %
MCH: 29.8 pg (ref 26.0–34.0)
MCHC: 33.1 g/dL (ref 32.0–36.0)
MCV: 90 fL (ref 80.0–100.0)
MONO ABS: 1.8 10*3/uL — AB (ref 0.2–1.0)
Monocytes Relative: 9 %
NEUTROS PCT: 86 %
Neutro Abs: 17.8 10*3/uL — ABNORMAL HIGH (ref 1.4–6.5)
Platelets: 464 10*3/uL — ABNORMAL HIGH (ref 150–440)
RBC: 2.26 MIL/uL — AB (ref 4.40–5.90)
RDW: 22.6 % — AB (ref 11.5–14.5)
WBC: 20.8 10*3/uL — AB (ref 3.8–10.6)

## 2017-06-26 LAB — COMPREHENSIVE METABOLIC PANEL
ALT: 11 U/L — AB (ref 17–63)
AST: 24 U/L (ref 15–41)
Albumin: 2.4 g/dL — ABNORMAL LOW (ref 3.5–5.0)
Alkaline Phosphatase: 125 U/L (ref 38–126)
Anion gap: 9 (ref 5–15)
BILIRUBIN TOTAL: 0.4 mg/dL (ref 0.3–1.2)
BUN: 20 mg/dL (ref 6–20)
CALCIUM: 8.2 mg/dL — AB (ref 8.9–10.3)
CO2: 23 mmol/L (ref 22–32)
CREATININE: 0.62 mg/dL (ref 0.61–1.24)
Chloride: 100 mmol/L — ABNORMAL LOW (ref 101–111)
Glucose, Bld: 158 mg/dL — ABNORMAL HIGH (ref 65–99)
Potassium: 4.1 mmol/L (ref 3.5–5.1)
Sodium: 132 mmol/L — ABNORMAL LOW (ref 135–145)
TOTAL PROTEIN: 5.1 g/dL — AB (ref 6.5–8.1)

## 2017-06-26 LAB — ABO/RH: ABO/RH(D): AB POS

## 2017-06-26 LAB — PREPARE RBC (CROSSMATCH)

## 2017-06-26 MED ORDER — OXYCODONE HCL 10 MG PO TABS: 10.0000 mg | ORAL_TABLET | ORAL | 0 refills | Status: DC | PRN

## 2017-06-26 MED ORDER — PIPERACILLIN-TAZOBACTAM 3.375 G IVPB
3.3750 g | Freq: Once | INTRAVENOUS | Status: AC
  Administered 2017-06-26: 3.375 g via INTRAVENOUS
  Filled 2017-06-26: qty 50

## 2017-06-26 MED ORDER — LEVOFLOXACIN 500 MG PO TABS: 500.0000 mg | ORAL_TABLET | Freq: Every day | ORAL | 0 refills | Status: DC

## 2017-06-26 MED ORDER — PIPERACILLIN SOD-TAZOBACTAM SO 2.25 (2-0.25) G IV SOLR: 3.3750 g | Freq: Once | INTRAVENOUS | Status: DC

## 2017-06-26 MED ORDER — MORPHINE SULFATE 2 MG/ML IJ SOLN
5.0000 mg | Freq: Once | INTRAMUSCULAR | Status: AC
  Administered 2017-06-26: 5 mg via INTRAVENOUS
  Filled 2017-06-26: qty 3

## 2017-06-26 MED ORDER — MORPHINE SULFATE 15 MG PO TABS: 15.0000 mg | ORAL_TABLET | ORAL | 0 refills | Status: DC | PRN

## 2017-06-26 MED ORDER — ACETAMINOPHEN 325 MG PO TABS
650.0000 mg | ORAL_TABLET | Freq: Once | ORAL | Status: AC
  Administered 2017-06-26: 650 mg via ORAL
  Filled 2017-06-26: qty 2

## 2017-06-26 MED ORDER — HEPARIN SOD (PORK) LOCK FLUSH 100 UNIT/ML IV SOLN
INTRAVENOUS | Status: AC
  Filled 2017-06-26: qty 5

## 2017-06-26 NOTE — Progress Notes (Signed)
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   Chief complaint/ Reason for visit- on treatment assessment prior to cycle 5 of folfox  Heme/Onc history:patient is a 71 year old Caucasian male who presented to the ER in June 2018with symptoms of worsening abdominal pain. He has been havving dark tarry stools for about 3 weeks prior to that. He is also had a significant weight loss of about 10 poundsover the last few months. CT abdomen done in the ER showed multiple liver metastases as well as a mass in the gastric cardia projecting into the lumen as well as adenopathy in the gastrohepatic ligament  EGD showed: Large fungating mass with bleeding at the GE junction 48 cm from the incisors. Mass was partially obstructing and partially circumferential involving one half of the lumen. Large fungating infiltrative and ulcerated and partially circumferential mass involving one half of the lumen circumference bleeding and stigmata of recent bleeding also found in the gastric cardia  Stomach mass cardia biopsy showed poorly differentiated adenocarcinoma. Her 2 neu negativeand PDL1 was expressed  CT thorax showed 7 mm lingular nodule. No evidence of bone mets on bone scan. L2 lucent lesion likely hemangioma  1st line palliative FOLFOX started on 03/27/17. PDL1 expression Positive  Interval history- He feels quite weak today. Reports sob today. Also reports pain in his epigastrium and RUQ. He has only been taking 1-2 hydrocodone a day. He is afraid he will take more than necessary and overdose. He does not wish to be admitted to the hospital.   ECOG PS- 3 Pain scale-  0 Opioid associated constipation- no  Review of systems- Review of Systems  Constitutional: Positive for malaise/fatigue. Negative for chills, fever and weight loss.       Lack of appetite  HENT: Negative for congestion, ear discharge and nosebleeds.   Eyes: Negative for blurred vision.  Respiratory: Negative for cough, hemoptysis, sputum production, shortness of breath and wheezing.   Cardiovascular: Negative for chest pain, palpitations, orthopnea and claudication.  Gastrointestinal: Positive for abdominal pain. Negative for blood in stool, constipation, diarrhea, heartburn, melena, nausea and vomiting.  Genitourinary: Negative for dysuria, flank pain, frequency, hematuria and urgency.  Musculoskeletal: Negative for back pain, joint pain and myalgias.  Skin: Negative for rash.  Neurological: Positive for weakness. Negative for dizziness, tingling, focal weakness, seizures and headaches.  Endo/Heme/Allergies: Does not bruise/bleed easily.  Psychiatric/Behavioral: Negative for depression and suicidal ideas. The patient does not have insomnia.       No Known Allergies   Past Medical History:  Diagnosis Date  . COPD (chronic obstructive pulmonary disease) ([place])      Past Surgical History:  Procedure Laterality Date  . ESOPHAGOGASTRODUODENOSCOPY (EGD) WITH PROPOFOL N/A 03/10/2017   Procedure: ESOPHAGOGASTRODUODENOSCOPY (EGD) WITH PROPOFOL;  Surgeon: Wilford Corner, MD;  Location[facility] ENDOSCOPY;  Service: Endoscopy;  Laterality: N/A;  . KNEE ARTHROSCOPY  1991  . PORTA CATH INSERTION N/A 03/21/2017   Procedure: Glori Luis Cath Insertion;  Surgeon: Algernon Huxley, MD;  Location: [place] CV LAB;  Service: Cardiovascular;  Laterality: N/A;  . TONSILLECTOMY      Social History   Social History  . Marital status: Married    Spouse name: N/A  .  Number of children: N/A  . Years of education: N/A   Occupational History  . Not on file.   Social History Main Topics  . Smoking  status: Former Smoker    Quit date: 06/2010  . Smokeless tobacco: Never Used  . Alcohol use No  . Drug use: No  . Sexual activity: Not on file   Other Topics Concern  . Not on file   Social History Narrative  . No narrative on file    No family history on file.   Current Outpatient Prescriptions:  .  dexamethasone (DECADRON) 4 MG tablet, Take 2 tablets (8 mg total) by mouth daily. Start the day after chemotherapy for 2 days. Take with food., Disp: 30 tablet, Rfl: 1 .  furosemide (LASIX) 20 MG tablet, Take 1 tablet (20 mg total) by mouth daily as needed., Disp: 30 tablet, Rfl: 0 .  HYDROcodone-acetaminophen (NORCO/VICODIN) 5-325 MG tablet, Take 1 tablet by mouth every 4 (four) hours as needed for moderate pain or severe pain., Disp: 60 tablet, Rfl: 0 .  morphine (MS CONTIN) 15 MG 12 hr tablet, Take 1 tablet (15 mg total) by mouth every 12 (twelve) hours., Disp: 60 tablet, Rfl: 0 .  pantoprazole (PROTONIX) 40 MG tablet, Take 1 tablet (40 mg total) by mouth 2 (two) times daily., Disp: 60 tablet, Rfl: 2 .  prochlorperazine (COMPAZINE) 10 MG tablet, Take 1 tablet (10 mg total) by mouth every 6 (six) hours as needed (Nausea or vomiting)., Disp: 30 tablet, Rfl: 1 .  lidocaine-prilocaine (EMLA) cream, Apply to affected area once (Patient not taking: Reported on 06/26/2017), Disp: 30 g, Rfl: 3 .  LORazepam (ATIVAN) 0.5 MG tablet, Take 1 tablet (0.5 mg total) by mouth every 6 (six) hours as needed (Nausea or vomiting). (Patient not taking: Reported on 06/26/2017), Disp: 30 tablet, Rfl: 0 .  megestrol (MEGACE) 40 MG tablet, Take 1 tablet (40 mg total) by mouth 2 (two) times daily. (Patient not taking: Reported on 06/26/2017), Disp: 60 tablet, Rfl: 3 .  ondansetron (ZOFRAN) 4 MG tablet, Take 1 tablet (4 mg total) by mouth every 6 (six) hours as needed for nausea. (Patient not taking: Reported on 06/12/2017), Disp: 20 tablet, Rfl: 0 .  ondansetron (ZOFRAN) 8 MG tablet, Take 1 tablet (8 mg total) by mouth  2 (two) times daily as needed for refractory nausea / vomiting. Start on day 3 after chemotherapy. (Patient not taking: Reported on 06/12/2017), Disp: 30 tablet, Rfl: 1 .  promethazine (PHENERGAN) 12.5 MG tablet, Take 1 tablet (12.5 mg total) by mouth 4 (four) times daily. (Patient not taking: Reported on 06/26/2017), Disp: 30 tablet, Rfl: 1 No current facility-administered medications for this visit.   Facility-Administered Medications Ordered in Other Visits:  .  sodium chloride flush (NS) 0.9 % injection 10 mL, 10 mL, Intracatheter, PRN, Sindy Guadeloupe, MD, 10 mL at 04/14/17 1055  Physical exam:  Vitals:   06/26/17 0905  BP: 113/69  Pulse: (!) 139  Resp: 20  Temp: (!) 92.9 F (33.8 C)  TempSrc: Tympanic  Weight: 109 lb (49.4 kg)   Physical Exam  Constitutional: He is oriented to person, place, and time.  Frail and cachectic. In distress from pain  HENT:  Head: Normocephalic and atraumatic.  Eyes: Pupils are equal, round, and reactive to light. EOM are normal.  Neck: Normal range of motion.  Cardiovascular: Normal rate, regular rhythm and normal heart sounds.   Pulmonary/Chest: Effort normal and breath sounds normal.  Abdominal: Soft. Bowel  sounds are normal.  TTP in epigastrium and RUQ  Musculoskeletal: He exhibits edema (R >L).  Neurological: He is alert and oriented to person, place, and time.  Skin: Skin is warm and dry.     CMP Latest Ref Rng & Units 06/12/2017  Glucose 65 - 99 mg/dL 122(H)  BUN 6 - 20 mg/dL 16  Creatinine 0.61 - 1.24 mg/dL 0.68  Sodium 135 - 145 mmol/L 133(L)  Potassium 3.5 - 5.1 mmol/L 3.9  Chloride 101 - 111 mmol/L 102  CO2 22 - 32 mmol/L 27  Calcium 8.9 - 10.3 mg/dL 8.1(L)  Total Protein 6.5 - 8.1 g/dL 4.8(L)  Total Bilirubin 0.3 - 1.2 mg/dL 0.5  Alkaline Phos 38 - 126 U/L 101  AST 15 - 41 U/L 25  ALT 17 - 63 U/L 13(L)   CBC Latest Ref Rng & Units 06/26/2017  WBC 3.8 - 10.6 K/uL 20.8(H)  Hemoglobin 13.0 - 18.0 g/dL 6.8(L)  Hematocrit 40.0 -  52.0 % 20.4(L)  Platelets 150 - 440 K/uL 464(H)     No results found.   Assessment and plan- Patient is a 71 y.o. male with Stage IV GE junction adenocarcinoma with liver metastases here for on treatment assessment prior to cycle 6 of FOLFOX  1.wbc 20.8 today. This along with tachycardia concerning for sepsis. He does not wish to be admitted to hospital today. I will obtain CXR and UA. Give him 1 dose of IV zosyn here. Start po levaquin as an outpatient. I will give him fluids again later this week along with ct scans given that he is getting 2 units of blood today  2. Severe symptomatic anemia- likely combination of chemotherapy and bleeding from tumor.  2 units of PRBC today  3. Neoplasm related pain- will give 6 mg IV morphine today. Stop hydrocodone and switch to oxycodone 10 mg PO Q4 prn for pain. bowel meds for opioid associated constipation  4. Stage Iv esophageal cancer- patient has severe protein calorie malnutrition and has not tolerated chemo well over last couple of weeks. Chemo held last time due to neutropenia. Overall prognosis is poor. Will repeat CT chest abdomen pelvis at this time. I have spoken to Dr. Baruch Gouty from rad Onc to see if he can get palliatuve RT  5. Leg edema- due to severe malnutrition and hypoalbuminemia  rtc in 1 week to discuss CT scan results and further goals of care   Visit Diagnosis 1. Symptomatic anemia   2. Esophageal cancer, stage IV ([place])   3. Liver metastases ([place])   4. Cough   5. Neoplasm related pain      Dr. Randa Evens, MD, MPH [facility] Pager- [phone] 06/26/2017 10:57 AM

## 2017-06-26 NOTE — Progress Notes (Signed)
Today pale and weak- resistant to ass't from staff. C/o severe pain -R chest area-dull without movt. Sharp pain w movt. SOB c/o pulse ox 98 % on RA-sitting. Noted to have 2 plus pitting edema bilateral feet and calves. L leg worse. Per pt legs feel very tight and heavy.  Tremulous w pain today in office.  Repeat pulse 118.

## 2017-06-26 NOTE — Consult Note (Signed)
NEW PATIENT EVALUATION  Name: Larry Wood  MRN: [id number]  Date:   06/26/2017     DOB: May 31, 1946   This 71 y.o. male patient presents to the clinic for initial evaluation of palliative treatment for known patient with stage IV distal esophageal adenocarcinoma. Needing transfusions based on consistent bleeding  REFERRING PHYSICIAN: Derinda Late, MD  CHIEF COMPLAINT: No chief complaint on file.   DIAGNOSIS: There were no encounter diagnoses.   PREVIOUS INVESTIGATIONS:  CT scans reviewed Pathology report reviewed Clinical notes reviewed  HPI: Patient is a 71 year old male who presented in June 2018 with increasing abdominal pain dark tarry stools and significant weight loss. He was found on CT scan to have multiple liver metastasis as well as a distal esophageal mass with tumor projecting into the lumen of his stomach. He also had gastrohepatic ligament adenopathy. Upper endoscopy showed a large fungating mass with ulceration and bleeding at the GE junction 48 cm from the incisor. Biopsy of mesh are poorly differentiated adenocarcinoma HER-2/neu negative PDL one was expressed. Patient was started on palliative FOLFOX chemotherapy. He was recently seen for increasing fatigue and this necessitated based on current bleeding 2 units of blood infused today. He is also on chronic regimen for his abdominal pain and discomfort. Recent chemotherapy has been held secondary to neutropenia. I been asked to evaluate the patient for possible palliative radiation therapy to his distal esophagus.  PLANNED TREATMENT REGIMEN: Palliative radiation therapy to distal esophagus  PAST MEDICAL HISTORY:  has a past medical history of COPD (chronic obstructive pulmonary disease) ([place]).    PAST SURGICAL HISTORY:  Past Surgical History:  Procedure Laterality Date  . ESOPHAGOGASTRODUODENOSCOPY (EGD) WITH PROPOFOL N/A 03/10/2017   Procedure: ESOPHAGOGASTRODUODENOSCOPY (EGD) WITH PROPOFOL;  Surgeon:  Wilford Corner, MD;  Location: [facility] ENDOSCOPY;  Service: Endoscopy;  Laterality: N/A;  . KNEE ARTHROSCOPY  1991  . PORTA CATH INSERTION N/A 03/21/2017   Procedure: Glori Luis Cath Insertion;  Surgeon: Algernon Huxley, MD;  Location: [place] CV LAB;  Service: Cardiovascular;  Laterality: N/A;  . TONSILLECTOMY      FAMILY HISTORY: family history is not on file.  SOCIAL HISTORY:  reports that he quit smoking about 7 years ago. He has never used smokeless tobacco. He reports that he does not drink alcohol or use drugs.  ALLERGIES: Patient has no known allergies.  MEDICATIONS:  Current Outpatient Prescriptions  Medication Sig Dispense Refill  . dexamethasone (DECADRON) 4 MG tablet Take 2 tablets (8 mg total) by mouth daily. Start the day after chemotherapy for 2 days. Take with food. 30 tablet 1  . furosemide (LASIX) 20 MG tablet Take 1 tablet (20 mg total) by mouth daily as needed. 30 tablet 0  . levofloxacin (LEVAQUIN) 500 MG tablet Take 1 tablet (500 mg total) by mouth daily. 7 tablet 0  . lidocaine-prilocaine (EMLA) cream Apply to affected area once (Patient not taking: Reported on 06/26/2017) 30 g 3  . LORazepam (ATIVAN) 0.5 MG tablet Take 1 tablet (0.5 mg total) by mouth every 6 (six) hours as needed (Nausea or vomiting). (Patient not taking: Reported on 06/26/2017) 30 tablet 0  . megestrol (MEGACE) 40 MG tablet Take 1 tablet (40 mg total) by mouth 2 (two) times daily. (Patient not taking: Reported on 06/26/2017) 60 tablet 3  . morphine (MS CONTIN) 15 MG 12 hr tablet Take 1 tablet (15 mg total) by mouth every 12 (twelve) hours. 60 tablet 0  . ondansetron (ZOFRAN) 4 MG tablet Take 1 tablet (  4 mg total) by mouth every 6 (six) hours as needed for nausea. (Patient not taking: Reported on 06/12/2017) 20 tablet 0  . ondansetron (ZOFRAN) 8 MG tablet Take 1 tablet (8 mg total) by mouth 2 (two) times daily as needed for refractory nausea / vomiting. Start on day 3 after chemotherapy. (Patient not taking:  Reported on 06/12/2017) 30 tablet 1  . Oxycodone HCl 10 MG TABS Take 1 tablet (10 mg total) by mouth every 4 (four) hours as needed. 60 tablet 0  . pantoprazole (PROTONIX) 40 MG tablet Take 1 tablet (40 mg total) by mouth 2 (two) times daily. 60 tablet 2  . prochlorperazine (COMPAZINE) 10 MG tablet Take 1 tablet (10 mg total) by mouth every 6 (six) hours as needed (Nausea or vomiting). 30 tablet 1  . promethazine (PHENERGAN) 12.5 MG tablet Take 1 tablet (12.5 mg total) by mouth 4 (four) times daily. (Patient not taking: Reported on 06/26/2017) 30 tablet 1   No current facility-administered medications for this encounter.    Facility-Administered Medications Ordered in Other Encounters  Medication Dose Route Frequency Provider Last Rate Last Dose  . sodium chloride flush (NS) 0.9 % injection 10 mL  10 mL Intracatheter PRN Sindy Guadeloupe, MD   10 mL at 04/14/17 1055    ECOG PERFORMANCE STATUS:  2 - Symptomatic, <50% confined to bed  REVIEW OF SYSTEMS: Patient is quite fatigued and anorexic with significant abdominal pain. Patient denies any weight loss, fatigue, weakness, fever, chills or night sweats. Patient denies any loss of vision, blurred vision. Patient denies any ringing  of the ears or hearing loss. No irregular heartbeat. Patient denies heart murmur or history of fainting. Patient denies any chest pain or pain radiating to her upper extremities. Patient denies any shortness of breath, difficulty breathing at night, cough or hemoptysis. Patient denies any swelling in the lower legs. Patient denies any nausea vomiting, vomiting of blood, or coffee ground material in the vomitus. Patient denies any stomach pain. Patient states has had normal bowel movements no significant constipation or diarrhea. Patient denies any dysuria, hematuria or significant nocturia. Patient denies any problems walking, swelling in the joints or loss of balance. Patient denies any skin changes, loss of hair or loss of  weight. Patient denies any excessive worrying or anxiety or significant depression. Patient denies any problems with insomnia. Patient denies excessive thirst, polyuria, polydipsia. Patient denies any swollen glands, patient denies easy bruising or easy bleeding. Patient denies any recent infections, allergies or URI. Patient "s visual fields have not changed significantly in recent time.    PHYSICAL EXAM: There were no vitals taken for this visit. Thin cachectic male in NAD. Well-developed well-nourished patient in NAD. HEENT reveals PERLA, EOMI, discs not visualized.  Oral cavity is clear. No oral mucosal lesions are identified. Neck is clear without evidence of cervical or supraclavicular adenopathy. Lungs are clear to A&P. Cardiac examination is essentially unremarkable with regular rate and rhythm without murmur rub or thrill. Abdomen is benign with no organomegaly or masses noted. Motor sensory and DTR levels are equal and symmetric in the upper and lower extremities. Cranial nerves II through XII are grossly intact. Proprioception is intact. No peripheral adenopathy or edema is identified. No motor or sensory levels are noted. Crude visual fields are within normal range.  LABORATORY DATA: Pathology reports reviewed    RADIOLOGY RESULTS: CT scans reviewed   IMPRESSION: Stage IV esophageal adenocarcinoma in 71 year old male with continued bleeding and abdominal pain for  palliative radiation therapy.  PLAN: At this time I to go ahead with a 4 week course of palliative radiation therapy. Would plan on delivering 4000 cGy in 20 fractions. I have set him up and personally ordered CT simulation for later this week. Risks and benefits of treatment including possible nausea fatigue alteration of blood counts skin reaction all were discussed in detail. I've asked the patient to start taking anti-medic therapy the day we start his actual treatments.There will be extra effort by both professional staff as  well as technical staff to coordinate and manage concurrent chemoradiation and ensuing side effects during his treatments. Patient family, him my treatment plan well.  I would like to take this opportunity to thank you for allowing me to participate in the care of your patient.Armstead Peaks., MD

## 2017-06-27 LAB — BPAM RBC
BLOOD PRODUCT EXPIRATION DATE: 201810122359
Blood Product Expiration Date: 201810112359
ISSUE DATE / TIME: 201809251156
ISSUE DATE / TIME: 201809251355
UNIT TYPE AND RH: 6200
Unit Type and Rh: 6200

## 2017-06-27 LAB — TYPE AND SCREEN
ABO/RH(D): AB POS
ANTIBODY SCREEN: NEGATIVE
Unit division: 0
Unit division: 0

## 2017-06-28 ENCOUNTER — Ambulatory Visit
Admission: RE | Admit: 2017-06-28 | Discharge: 2017-06-28 | Disposition: A | Discharge status: Home / Self Care | Payer: PPO | Source: Ambulatory Visit | Attending: Radiation Oncology | Admitting: Radiation Oncology

## 2017-06-28 DIAGNOSIS — I7090 Unspecified atherosclerosis: Secondary | ICD-10-CM | POA: Insufficient documentation

## 2017-06-28 DIAGNOSIS — Z87891 Personal history of nicotine dependence: Secondary | ICD-10-CM | POA: Diagnosis not present

## 2017-06-28 DIAGNOSIS — R918 Other nonspecific abnormal finding of lung field: Secondary | ICD-10-CM | POA: Insufficient documentation

## 2017-06-28 DIAGNOSIS — R05 Cough: Secondary | ICD-10-CM | POA: Diagnosis not present

## 2017-06-28 DIAGNOSIS — M47896 Other spondylosis, lumbar region: Secondary | ICD-10-CM | POA: Diagnosis not present

## 2017-06-28 DIAGNOSIS — C155 Malignant neoplasm of lower third of esophagus: Secondary | ICD-10-CM | POA: Diagnosis not present

## 2017-06-28 DIAGNOSIS — R590 Localized enlarged lymph nodes: Secondary | ICD-10-CM | POA: Diagnosis not present

## 2017-06-28 DIAGNOSIS — C787 Secondary malignant neoplasm of liver and intrahepatic bile duct: Secondary | ICD-10-CM | POA: Diagnosis not present

## 2017-06-28 DIAGNOSIS — M5136 Other intervertebral disc degeneration, lumbar region: Secondary | ICD-10-CM | POA: Insufficient documentation

## 2017-06-28 DIAGNOSIS — D649 Anemia, unspecified: Secondary | ICD-10-CM | POA: Diagnosis not present

## 2017-06-28 DIAGNOSIS — C159 Malignant neoplasm of esophagus, unspecified: Secondary | ICD-10-CM | POA: Diagnosis not present

## 2017-06-28 DIAGNOSIS — I745 Embolism and thrombosis of iliac artery: Secondary | ICD-10-CM | POA: Insufficient documentation

## 2017-06-29 ENCOUNTER — Ambulatory Visit
Admission: RE | Admit: 2017-06-29 | Discharge: 2017-06-29 | Disposition: A | Discharge status: Home / Self Care | Payer: PPO | Source: Ambulatory Visit | Attending: Oncology | Admitting: Oncology

## 2017-06-29 ENCOUNTER — Inpatient Hospital Stay: Payer: PPO

## 2017-06-29 DIAGNOSIS — C160 Malignant neoplasm of cardia: Secondary | ICD-10-CM | POA: Diagnosis not present

## 2017-06-29 DIAGNOSIS — C799 Secondary malignant neoplasm of unspecified site: Principal | ICD-10-CM

## 2017-06-29 DIAGNOSIS — C787 Secondary malignant neoplasm of liver and intrahepatic bile duct: Secondary | ICD-10-CM

## 2017-06-29 DIAGNOSIS — C155 Malignant neoplasm of lower third of esophagus: Secondary | ICD-10-CM | POA: Diagnosis not present

## 2017-06-29 DIAGNOSIS — R059 Cough, unspecified: Secondary | ICD-10-CM

## 2017-06-29 DIAGNOSIS — R05 Cough: Secondary | ICD-10-CM

## 2017-06-29 DIAGNOSIS — C159 Malignant neoplasm of esophagus, unspecified: Secondary | ICD-10-CM | POA: Diagnosis not present

## 2017-06-29 DIAGNOSIS — R918 Other nonspecific abnormal finding of lung field: Secondary | ICD-10-CM | POA: Diagnosis not present

## 2017-06-29 DIAGNOSIS — Z87891 Personal history of nicotine dependence: Secondary | ICD-10-CM | POA: Diagnosis not present

## 2017-06-29 DIAGNOSIS — D649 Anemia, unspecified: Secondary | ICD-10-CM

## 2017-06-29 HISTORY — DX: Malignant (primary) neoplasm, unspecified: C80.1

## 2017-06-29 MED ORDER — IOPAMIDOL (ISOVUE-300) INJECTION 61%
80.0000 mL | Freq: Once | INTRAVENOUS | Status: AC | PRN
  Administered 2017-06-29: 80 mL via INTRAVENOUS

## 2017-06-29 MED ORDER — HEPARIN SOD (PORK) LOCK FLUSH 100 UNIT/ML IV SOLN
INTRAVENOUS | Status: AC
  Filled 2017-06-29: qty 5

## 2017-06-29 MED ORDER — SODIUM CHLORIDE 0.9% FLUSH
10.0000 mL | Freq: Once | INTRAVENOUS | Status: AC
  Administered 2017-06-29: 10 mL via INTRAVENOUS
  Filled 2017-06-29: qty 10

## 2017-06-29 MED ORDER — SODIUM CHLORIDE 0.9 % IV SOLN
Freq: Once | INTRAVENOUS | Status: AC
  Administered 2017-06-29: 12:00:00 via INTRAVENOUS
  Filled 2017-06-29: qty 1000

## 2017-06-29 MED ORDER — HEPARIN SOD (PORK) LOCK FLUSH 100 UNIT/ML IV SOLN
500.0000 [IU] | Freq: Once | INTRAVENOUS | Status: DC
Start: 2017-06-29 — End: 2017-06-29

## 2017-07-02 ENCOUNTER — Other Ambulatory Visit: Payer: Self-pay | Admitting: *Deleted

## 2017-07-02 DIAGNOSIS — C787 Secondary malignant neoplasm of liver and intrahepatic bile duct: Secondary | ICD-10-CM

## 2017-07-02 DIAGNOSIS — C159 Malignant neoplasm of esophagus, unspecified: Secondary | ICD-10-CM

## 2017-07-02 NOTE — Progress Notes (Signed)
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   Chief complaint/ Reason for visit- discuss CT scan results and further goals of care  Heme/Onc history:patient is a 71 year old Caucasian male who presented to the ER in June 2018with symptoms of worsening abdominal pain. He has been havving dark tarry stools for about 3 weeks prior to that. He is also had a significant weight loss of about 10 poundsover the last few months. CT abdomen done in the ER showed multiple liver metastases as well as a mass in the gastric cardia projecting into the lumen as well as adenopathy in the gastrohepatic ligament  EGD showed: Large fungating mass with bleeding at the GE junction 48 cm from the incisors. Mass was partially obstructing and partially circumferential involving one half of the lumen. Large fungating infiltrative and ulcerated and partially circumferential mass involving one half of the lumen circumference bleeding and stigmata of recent bleeding also found in the gastric cardia  Stomach mass cardia biopsy showed poorly differentiated adenocarcinoma. Her 2 neu negativeand PDL1 was expressed  CT thorax showed 7 mm lingular nodule. No evidence of bone mets on bone scan. L2 lucent lesion likely hemangioma  1st line palliative FOLFOX started on 03/27/17. PDL1 expression Positive  Ct scans after 5 cycles of FOLFOX revealed marked enlargement of gastric mass and progression of liver mets. Also showed new RML lung nodule ? mets  Interval history- he feels better as compared to last week. No fever. He completed his levaquin course today. Wife reports he is walking around better.  He takes about 3 doses of prn morphine but 15 mg is not controlling his pain. He has been taking miralax for constipation  ECOG PS- 2 Pain scale- 5 Opioid associated constipation- yes  Review of systems- Review of Systems  Constitutional: Positive for malaise/fatigue. Negative for chills, fever and weight loss.  HENT: Negative for congestion, ear discharge and nosebleeds.   Eyes: Negative for blurred vision.  Respiratory: Negative for cough, hemoptysis, sputum production, shortness of breath and wheezing.   Cardiovascular: Negative for chest pain, palpitations, orthopnea and claudication.  Gastrointestinal: Positive for abdominal pain. Negative for blood in stool, constipation, diarrhea, heartburn, melena, nausea and vomiting.  Genitourinary: Negative for dysuria, flank pain, frequency, hematuria and urgency.  Musculoskeletal: Negative for back pain, joint pain and myalgias.  Skin: Negative for rash.  Neurological: Positive for weakness. Negative for dizziness, tingling, focal weakness, seizures and headaches.  Endo/Heme/Allergies: Does not bruise/bleed easily.  Psychiatric/Behavioral: Negative for depression and suicidal ideas. The patient does not have insomnia.        No Known Allergies   Past Medical History:  Diagnosis Date  . Cancer ([place])   . COPD (chronic obstructive pulmonary disease) ([place])      Past Surgical History:  Procedure Laterality Date  . ESOPHAGOGASTRODUODENOSCOPY (EGD) WITH PROPOFOL N/A 03/10/2017   Procedure: ESOPHAGOGASTRODUODENOSCOPY (EGD) WITH PROPOFOL;  Surgeon: Wilford Corner, MD;  Location: [facility] ENDOSCOPY;  Service: Endoscopy;  Laterality: N/A;  . KNEE ARTHROSCOPY  1991  . PORTA CATH INSERTION N/A 03/21/2017   Procedure: Glori Luis Cath Insertion;  Surgeon: Algernon Huxley, MD;  Location: [place] CV LAB;  Service: Cardiovascular;  Laterality: N/A;  .  TONSILLECTOMY      Social History   Social History  . Marital status: Married    Spouse name: N/A   . Number of children: N/A  . Years of education: N/A   Occupational History  . Not on file.   Social History Main Topics  . Smoking status: Former Smoker    Quit date: 06/2010  . Smokeless tobacco: Never Used  . Alcohol use No  . Drug use: No  . Sexual activity: Not on file   Other Topics Concern  . Not on file   Social History Narrative  . No narrative on file    No family history on file.   Current Outpatient Prescriptions:  .  dexamethasone (DECADRON) 4 MG tablet, Take 2 tablets (8 mg total) by mouth daily. Start the day after chemotherapy for 2 days. Take with food., Disp: 30 tablet, Rfl: 1 .  furosemide (LASIX) 20 MG tablet, Take 1 tablet (20 mg total) by mouth daily as needed., Disp: 30 tablet, Rfl: 0 .  levofloxacin (LEVAQUIN) 500 MG tablet, Take 1 tablet (500 mg total) by mouth daily., Disp: 7 tablet, Rfl: 0 .  lidocaine-prilocaine (EMLA) cream, Apply to affected area once (Patient not taking: Reported on 06/26/2017), Disp: 30 g, Rfl: 3 .  LORazepam (ATIVAN) 0.5 MG tablet, Take 1 tablet (0.5 mg total) by mouth every 6 (six) hours as needed (Nausea or vomiting). (Patient not taking: Reported on 06/26/2017), Disp: 30 tablet, Rfl: 0 .  megestrol (MEGACE) 40 MG tablet, Take 1 tablet (40 mg total) by mouth 2 (two) times daily. (Patient not taking: Reported on 06/26/2017), Disp: 60 tablet, Rfl: 3 .  morphine (MS CONTIN) 15 MG 12 hr tablet, Take 1 tablet (15 mg total) by mouth every 12 (twelve) hours., Disp: 60 tablet, Rfl: 0 .  morphine (MSIR) 15 MG tablet, Take 1 tablet (15 mg total) by mouth every 4 (four) hours as needed for severe pain., Disp: 60 tablet, Rfl: 0 .  ondansetron (ZOFRAN) 4 MG tablet, Take 1 tablet (4 mg total) by mouth every 6 (six) hours as needed for nausea. (Patient not taking: Reported on 06/12/2017), Disp: 20 tablet, Rfl: 0 .  ondansetron (ZOFRAN) 8 MG tablet, Take 1 tablet (8 mg total) by mouth 2 (two) times daily as needed for refractory nausea / vomiting.  Start on day 3 after chemotherapy. (Patient not taking: Reported on 06/12/2017), Disp: 30 tablet, Rfl: 1 .  pantoprazole (PROTONIX) 40 MG tablet, Take 1 tablet (40 mg total) by mouth 2 (two) times daily., Disp: 60 tablet, Rfl: 2 .  prochlorperazine (COMPAZINE) 10 MG tablet, Take 1 tablet (10 mg total) by mouth every 6 (six) hours as needed (Nausea or vomiting)., Disp: 30 tablet, Rfl: 1 .  promethazine (PHENERGAN) 12.5 MG tablet, Take 1 tablet (12.5 mg total) by mouth 4 (four) times daily. (Patient not taking: Reported on 06/26/2017), Disp: 30 tablet, Rfl: 1 No current facility-administered medications for this visit.   Facility-Administered Medications Ordered in Other Visits:  .  sodium chloride flush (NS) 0.9 % injection 10 mL, 10 mL, Intracatheter, PRN, Sindy Guadeloupe, MD, 10 mL at 04/14/17 1055  Physical exam:  Vitals:   07/03/17 0909  BP: 132/72  Pulse: 62  Resp: 16  Temp: (!) 96.1 F (35.6 C)  TempSrc: Tympanic  Weight: 115 lb (52.2 kg)  Height: 5' 10"  (1.778 m)   Physical Exam  Constitutional: He is oriented to person, place, and time.  Thin and cachectic  HENT:  Head: Normocephalic and atraumatic.  Eyes: Pupils are equal, round, and reactive to light. EOM are normal.  Neck: Normal range of motion.  Cardiovascular: Normal rate, regular rhythm and normal heart sounds.   Pulmonary/Chest: Effort normal and breath sounds normal.  Abdominal: Soft. Bowel sounds are normal.  TTP in epigastrium  Musculoskeletal: He exhibits edema.  Neurological: He is alert and oriented to person, place, and time.  Skin: Skin is warm and dry.     CMP Latest Ref Rng & Units 06/26/2017  Glucose 65 - 99 mg/dL 158(H)  BUN 6 - 20 mg/dL 20  Creatinine 0.61 - 1.24 mg/dL 0.62  Sodium 135 - 145 mmol/L 132(L)  Potassium 3.5 - 5.1 mmol/L 4.1  Chloride 101 - 111 mmol/L 100(L)  CO2 22 - 32 mmol/L 23  Calcium 8.9 - 10.3 mg/dL 8.2(L)  Total Protein 6.5 - 8.1 g/dL 5.1(L)  Total Bilirubin 0.3 - 1.2 mg/dL  0.4  Alkaline Phos 38 - 126 U/L 125  AST 15 - 41 U/L 24  ALT 17 - 63 U/L 11(L)   CBC Latest Ref Rng & Units 06/26/2017  WBC 3.8 - 10.6 K/uL 20.8(H)  Hemoglobin 13.0 - 18.0 g/dL 6.8(L)  Hematocrit 40.0 - 52.0 % 20.4(L)  Platelets 150 - 440 K/uL 464(H)    No images are attached to the encounter.  Dg Chest 2 View  Result Date: 06/27/2017 CLINICAL DATA:  Cough. Patient has metastatic esophageal cancer has been receiving chemotherapy for 1 month. EXAM: CHEST  2 VIEW COMPARISON:  March 10, 2027 FINDINGS: The heart size and mediastinal contours are within normal limits. Right Center venous line is unchanged. The lungs are hyperinflated. There is no focal infiltrate, pulmonary edema, or pleural effusion. There are calcified granulomas within the left lung. The visualized skeletal structures are unremarkable. IMPRESSION: No active cardiopulmonary disease.  No focal pneumonia. Electronically Signed   By: Abelardo Diesel M.D.   On: 06/27/2017 08:36   Ct Chest W Contrast  Result Date: 06/29/2017 CLINICAL DATA:  Esophageal cancer restaging. Finished chemotherapy 2 weeks ago. EXAM: CT CHEST, ABDOMEN, AND PELVIS WITH CONTRAST TECHNIQUE: Multidetector CT imaging of the chest, abdomen and pelvis was performed following the standard protocol during bolus administration of intravenous contrast. CONTRAST:  59m ISOVUE-300 IOPAMIDOL (ISOVUE-300) INJECTION 61% COMPARISON:  Multiple exams, including 03/10/2017 and 03/09/2017 FINDINGS: CT CHEST FINDINGS Cardiovascular: Coronary, aortic arch, and branch vessel atherosclerotic vascular disease. Right Port-A-Cath tip: SVC. Mildly ectatic ascending thoracic aorta at 3.5 cm. Mediastinum/Nodes: Right paratracheal node 0.6 cm in short axis on image 9/2, stable. Small amount of contrast medium in the esophagus. Lungs/Pleura: Severe centrilobular emphysema. Biapical pleuroparenchymal scarring with associated calcifications. There is a new 1.2 by 0.9 cm nodule in the right middle lobe  on image 113/4 with some associated volume loss. Nearby 5 mm nodule and 0.8 by 0.5 cm nodules medially in the right middle lobe on images 128 and 129 of series 4, respectively. 3 mm stable subpleural nodule in the superior segment right lower lobe, image 69/4. Stable 6 mm lingular nodule, image 128/4. Calcified granuloma in the left lower lobe. Calcified granulomas in the left lower lobe. Musculoskeletal: Unremarkable CT ABDOMEN PELVIS FINDINGS Hepatobiliary: Numerous hypodense lesions are present throughout the liver, some representing cystic degeneration and necrosis of previously existing lesions, but there also enlarging lesions compatible with overall progression. For example, the right hepatic lobe lesion laterally on image 67/ 2 is complex and measures 5.8 by 3.7 cm, formerly 2.8 by 2.2 cm. The  Gallbladder is mildly contracted. Stable borderline extrahepatic biliary dilatation. Pancreas: Unremarkable Spleen: Unremarkable Adrenals/Urinary Tract: Right mid kidney cyst appears stable. Several additional small hypodense right renal lesions are technically nonspecific although statistically likely to be cysts. Adrenal glands normal. Mildly distended urinary bladder. Stomach/Bowel: The mass at the gastroesophageal junction/ gastric cardia measures 7.1 by 4.3 cm, formerly 3.3 by 2.6 cm. Vascular/Lymphatic: Dense aortoiliac atherosclerotic calcifications. Occluded left common iliac artery, as before, with reconstitution after a 3 cm occluded segment. Worsened right gastric and gastrohepatic ligament adenopathy. The right gastric native 2.1 cm in short axis on image 71/2, formerly 1.6 cm by my measurement. Gastrohepatic ligament node 1.8 cm in short axis on image 69/2, formerly 1.6 cm. Small periaortic lymph nodes are present. Reproductive: Unremarkable Other: Diffuse subcutaneous and mesenteric edema. Musculoskeletal: Degenerative disc disease and spondylosis, causing mild left foraminal stenosis at L4-5 and L5-S1.  Stable small sclerotic and lucent lesions in the L2 vertebral body, probably benign. IMPRESSION: 1. Marked enlargement of the gastric mass with enlarging adjacent adenopathy. 2. Progression of liver metastatic lesions. Although some are smaller, most are larger. Some demonstrate cystic necrosis centrally possibly from treatment effects, but others appears solid and significantly larger than before. 3. New 1.2 by 0.9 cm right middle lobe nodule. There is associated volume loss and some of this could simply be due to atelectasis, but a metastatic nodule is not excluded. There also several additional new smaller nodules in the right middle lobe, as well as several stable nodules noted previously. 4. Dense atherosclerotic calcification with chronic occlusion of the left common iliac artery and reconstitution after a 3 cm occluded segment. 5. Diffuse subcutaneous and mesenteric edema suggesting third spacing of fluid. 6. Lumbar spondylosis and degenerative disc disease. Electronically Signed   By: Van Clines M.D.   On: 06/29/2017 16:15   Ct Abdomen Pelvis W Contrast  Result Date: 06/29/2017 CLINICAL DATA:  Esophageal cancer restaging. Finished chemotherapy 2 weeks ago. EXAM: CT CHEST, ABDOMEN, AND PELVIS WITH CONTRAST TECHNIQUE: Multidetector CT imaging of the chest, abdomen and pelvis was performed following the standard protocol during bolus administration of intravenous contrast. CONTRAST:  60m ISOVUE-300 IOPAMIDOL (ISOVUE-300) INJECTION 61% COMPARISON:  Multiple exams, including 03/10/2017 and 03/09/2017 FINDINGS: CT CHEST FINDINGS Cardiovascular: Coronary, aortic arch, and branch vessel atherosclerotic vascular disease. Right Port-A-Cath tip: SVC. Mildly ectatic ascending thoracic aorta at 3.5 cm. Mediastinum/Nodes: Right paratracheal node 0.6 cm in short axis on image 9/2, stable. Small amount of contrast medium in the esophagus. Lungs/Pleura: Severe centrilobular emphysema. Biapical pleuroparenchymal  scarring with associated calcifications. There is a new 1.2 by 0.9 cm nodule in the right middle lobe on image 113/4 with some associated volume loss. Nearby 5 mm nodule and 0.8 by 0.5 cm nodules medially in the right middle lobe on images 128 and 129 of series 4, respectively. 3 mm stable subpleural nodule in the superior segment right lower lobe, image 69/4. Stable 6 mm lingular nodule, image 128/4. Calcified granuloma in the left lower lobe. Calcified granulomas in the left lower lobe. Musculoskeletal: Unremarkable CT ABDOMEN PELVIS FINDINGS Hepatobiliary: Numerous hypodense lesions are present throughout the liver, some representing cystic degeneration and necrosis of previously existing lesions, but there also enlarging lesions compatible with overall progression. For example, the right hepatic lobe lesion laterally on image 67/ 2 is complex and measures 5.8 by 3.7 cm, formerly 2.8 by 2.2 cm. The Gallbladder is mildly contracted. Stable borderline extrahepatic biliary dilatation. Pancreas: Unremarkable Spleen: Unremarkable Adrenals/Urinary Tract: Right mid kidney cyst  appears stable. Several additional small hypodense right renal lesions are technically nonspecific although statistically likely to be cysts. Adrenal glands normal. Mildly distended urinary bladder. Stomach/Bowel: The mass at the gastroesophageal junction/ gastric cardia measures 7.1 by 4.3 cm, formerly 3.3 by 2.6 cm. Vascular/Lymphatic: Dense aortoiliac atherosclerotic calcifications. Occluded left common iliac artery, as before, with reconstitution after a 3 cm occluded segment. Worsened right gastric and gastrohepatic ligament adenopathy. The right gastric native 2.1 cm in short axis on image 71/2, formerly 1.6 cm by my measurement. Gastrohepatic ligament node 1.8 cm in short axis on image 69/2, formerly 1.6 cm. Small periaortic lymph nodes are present. Reproductive: Unremarkable Other: Diffuse subcutaneous and mesenteric edema.  Musculoskeletal: Degenerative disc disease and spondylosis, causing mild left foraminal stenosis at L4-5 and L5-S1. Stable small sclerotic and lucent lesions in the L2 vertebral body, probably benign. IMPRESSION: 1. Marked enlargement of the gastric mass with enlarging adjacent adenopathy. 2. Progression of liver metastatic lesions. Although some are smaller, most are larger. Some demonstrate cystic necrosis centrally possibly from treatment effects, but others appears solid and significantly larger than before. 3. New 1.2 by 0.9 cm right middle lobe nodule. There is associated volume loss and some of this could simply be due to atelectasis, but a metastatic nodule is not excluded. There also several additional new smaller nodules in the right middle lobe, as well as several stable nodules noted previously. 4. Dense atherosclerotic calcification with chronic occlusion of the left common iliac artery and reconstitution after a 3 cm occluded segment. 5. Diffuse subcutaneous and mesenteric edema suggesting third spacing of fluid. 6. Lumbar spondylosis and degenerative disc disease. Electronically Signed   By: Van Clines M.D.   On: 06/29/2017 16:15     Assessment and plan- Patient is a 71 y.o. male with Stage IV GE junction adenocarcinoma with liver metastases here to discuss results of CT scan  1. Stage IV GE junction cancer- I have personally reviewed recent CT images. patient is starting palliative RT to his stomach and GE junction due to worsening pain and anemia. I will speak to Dr. Baruch Gouty and see if his duration of Rx can be reduced to 2 weeks instead of 4. I discussed results of CT chest abdomen and pelvis which shows progression of primary tumor and liver mets. There is also concern for pulmonary mets. I would therefore like to start second line treatment fairly soon instead of waiting for 4 weeks. He does have significant anemia. Performance status is borderline and he is quite cachectic. I  would therefore favor immunotherapy over chemotherapy given that his tumor was PDL1 positive. Response rates with immunotherapy in GE junction tumors is not great but I do not think he can tolerate chemotherapy safely given his PS. Patient would like to think about this as well. I will see him back in 10 days with cbc, cmp  2. Goals of care- discussed declining PS and advanced Stage IV malignancy. Discussed CODE status and treatment wishes and he wishes to be DNR/DNI. MOST form updated and copy given to him  3. Neoplasm related pain- increase morphine IR to 30 mg Q4 prn for pain. Ok to take SR and IR together when due  4. Opioid associated constipation- patient advised to take senna plus miralax  5. Possible sepsis- source unclear. No fever. Wbc count improved from 20 to 10. Patient appears clinically improved and tachycardia has resolved    Visit Diagnosis 1. Esophageal cancer, stage IV ([place])   2. Liver metastases ([place])  3. Neoplasm related pain   4. Goals of care, counseling/discussion      Dr. Randa Evens, MD, MPH [facility] at [facility] Pager- [phone] 07/02/2017 2:02 PM

## 2017-07-03 ENCOUNTER — Inpatient Hospital Stay: Payer: PPO | Attending: Oncology

## 2017-07-03 ENCOUNTER — Encounter: Payer: Self-pay | Admitting: Oncology

## 2017-07-03 ENCOUNTER — Inpatient Hospital Stay: Payer: PPO

## 2017-07-03 ENCOUNTER — Inpatient Hospital Stay (HOSPITAL_BASED_OUTPATIENT_CLINIC_OR_DEPARTMENT_OTHER): Payer: PPO | Admitting: Oncology

## 2017-07-03 VITALS — BP 132/72 | HR 62 | Temp 96.1°F | Resp 16 | Ht 70.0 in | Wt 115.0 lb

## 2017-07-03 DIAGNOSIS — G893 Neoplasm related pain (acute) (chronic): Secondary | ICD-10-CM | POA: Insufficient documentation

## 2017-07-03 DIAGNOSIS — C787 Secondary malignant neoplasm of liver and intrahepatic bile duct: Secondary | ICD-10-CM | POA: Diagnosis not present

## 2017-07-03 DIAGNOSIS — R634 Abnormal weight loss: Secondary | ICD-10-CM | POA: Diagnosis not present

## 2017-07-03 DIAGNOSIS — Z9221 Personal history of antineoplastic chemotherapy: Secondary | ICD-10-CM | POA: Diagnosis not present

## 2017-07-03 DIAGNOSIS — R5383 Other fatigue: Secondary | ICD-10-CM

## 2017-07-03 DIAGNOSIS — T402X5A Adverse effect of other opioids, initial encounter: Secondary | ICD-10-CM

## 2017-07-03 DIAGNOSIS — R109 Unspecified abdominal pain: Secondary | ICD-10-CM | POA: Diagnosis not present

## 2017-07-03 DIAGNOSIS — D6481 Anemia due to antineoplastic chemotherapy: Secondary | ICD-10-CM | POA: Diagnosis not present

## 2017-07-03 DIAGNOSIS — R531 Weakness: Secondary | ICD-10-CM

## 2017-07-03 DIAGNOSIS — R918 Other nonspecific abnormal finding of lung field: Secondary | ICD-10-CM | POA: Diagnosis not present

## 2017-07-03 DIAGNOSIS — J449 Chronic obstructive pulmonary disease, unspecified: Secondary | ICD-10-CM

## 2017-07-03 DIAGNOSIS — E46 Unspecified protein-calorie malnutrition: Secondary | ICD-10-CM | POA: Diagnosis not present

## 2017-07-03 DIAGNOSIS — C160 Malignant neoplasm of cardia: Secondary | ICD-10-CM | POA: Diagnosis not present

## 2017-07-03 DIAGNOSIS — Z23 Encounter for immunization: Secondary | ICD-10-CM

## 2017-07-03 DIAGNOSIS — Z79899 Other long term (current) drug therapy: Secondary | ICD-10-CM

## 2017-07-03 DIAGNOSIS — C159 Malignant neoplasm of esophagus, unspecified: Secondary | ICD-10-CM

## 2017-07-03 DIAGNOSIS — R609 Edema, unspecified: Secondary | ICD-10-CM | POA: Insufficient documentation

## 2017-07-03 DIAGNOSIS — T451X5S Adverse effect of antineoplastic and immunosuppressive drugs, sequela: Secondary | ICD-10-CM | POA: Diagnosis not present

## 2017-07-03 DIAGNOSIS — K5903 Drug induced constipation: Secondary | ICD-10-CM

## 2017-07-03 DIAGNOSIS — Z7189 Other specified counseling: Secondary | ICD-10-CM

## 2017-07-03 DIAGNOSIS — E778 Other disorders of glycoprotein metabolism: Secondary | ICD-10-CM | POA: Insufficient documentation

## 2017-07-03 DIAGNOSIS — E8809 Other disorders of plasma-protein metabolism, not elsewhere classified: Secondary | ICD-10-CM | POA: Insufficient documentation

## 2017-07-03 DIAGNOSIS — Z87891 Personal history of nicotine dependence: Secondary | ICD-10-CM | POA: Insufficient documentation

## 2017-07-03 LAB — COMPREHENSIVE METABOLIC PANEL
ALBUMIN: 2.2 g/dL — AB (ref 3.5–5.0)
ALT: 14 U/L — ABNORMAL LOW (ref 17–63)
AST: 27 U/L (ref 15–41)
Alkaline Phosphatase: 110 U/L (ref 38–126)
Anion gap: 9 (ref 5–15)
BUN: 9 mg/dL (ref 6–20)
CHLORIDE: 98 mmol/L — AB (ref 101–111)
CO2: 25 mmol/L (ref 22–32)
Calcium: 8.4 mg/dL — ABNORMAL LOW (ref 8.9–10.3)
Creatinine, Ser: 0.52 mg/dL — ABNORMAL LOW (ref 0.61–1.24)
GFR calc Af Amer: >60 mL/min (ref >60)
GFR calc non Af Amer: >60 mL/min (ref >60)
GLUCOSE: 118 mg/dL — AB (ref 65–99)
POTASSIUM: 3.8 mmol/L (ref 3.5–5.1)
SODIUM: 132 mmol/L — AB (ref 135–145)
Total Bilirubin: 0.4 mg/dL (ref 0.3–1.2)
Total Protein: 5.4 g/dL — ABNORMAL LOW (ref 6.5–8.1)

## 2017-07-03 LAB — CBC WITH DIFFERENTIAL/PLATELET
BASOS PCT: 1 %
Basophils Absolute: 0.1 10*3/uL (ref 0–0.1)
EOS PCT: 1 %
Eosinophils Absolute: 0.2 10*3/uL (ref 0–0.7)
HEMATOCRIT: 24.1 % — AB (ref 40.0–52.0)
Hemoglobin: 8.3 g/dL — ABNORMAL LOW (ref 13.0–18.0)
Lymphocytes Relative: 8 %
Lymphs Abs: 0.8 10*3/uL — ABNORMAL LOW (ref 1.0–3.6)
MCH: 29.9 pg (ref 26.0–34.0)
MCHC: 34.3 g/dL (ref 32.0–36.0)
MCV: 87 fL (ref 80.0–100.0)
MONO ABS: 1.3 10*3/uL — AB (ref 0.2–1.0)
Monocytes Relative: 12 %
NEUTROS ABS: 8.3 10*3/uL — AB (ref 1.4–6.5)
Neutrophils Relative %: 78 %
PLATELETS: 364 10*3/uL (ref 150–440)
RBC: 2.77 MIL/uL — ABNORMAL LOW (ref 4.40–5.90)
RDW: 19.3 % — AB (ref 11.5–14.5)
WBC: 10.8 10*3/uL — ABNORMAL HIGH (ref 3.8–10.6)

## 2017-07-03 LAB — SAMPLE TO BLOOD BANK

## 2017-07-03 MED ORDER — MORPHINE SULFATE 30 MG PO TABS: 30.0000 mg | ORAL_TABLET | ORAL | 0 refills | Status: DC | PRN

## 2017-07-03 MED ORDER — HEPARIN SOD (PORK) LOCK FLUSH 100 UNIT/ML IV SOLN
INTRAVENOUS | Status: AC
  Filled 2017-07-03: qty 5

## 2017-07-03 MED ORDER — HEPARIN SOD (PORK) LOCK FLUSH 100 UNIT/ML IV SOLN
500.0000 [IU] | Freq: Once | INTRAVENOUS | Status: AC
  Administered 2017-07-03: 500 [IU] via INTRAVENOUS

## 2017-07-03 MED ORDER — INFLUENZA VAC SPLIT QUAD 0.5 ML IM SUSY
0.5000 mL | PREFILLED_SYRINGE | Freq: Once | INTRAMUSCULAR | Status: AC
  Administered 2017-07-03: 0.5 mL via INTRAMUSCULAR

## 2017-07-03 NOTE — Progress Notes (Signed)
Follow up with chemo planned for today. Patient report having some abdominal pain with constipation, but it has improved with the use of Miralax. Patient is inquiring about getting the Flu Vaccine.

## 2017-07-04 ENCOUNTER — Ambulatory Visit
Admission: RE | Admit: 2017-07-04 | Discharge: 2017-07-04 | Disposition: A | Discharge status: Home / Self Care | Payer: PPO | Source: Ambulatory Visit | Attending: Radiation Oncology | Admitting: Radiation Oncology

## 2017-07-04 DIAGNOSIS — C155 Malignant neoplasm of lower third of esophagus: Secondary | ICD-10-CM | POA: Diagnosis not present

## 2017-07-04 DIAGNOSIS — Z87891 Personal history of nicotine dependence: Secondary | ICD-10-CM | POA: Diagnosis not present

## 2017-07-04 DIAGNOSIS — C160 Malignant neoplasm of cardia: Secondary | ICD-10-CM | POA: Diagnosis not present

## 2017-07-05 ENCOUNTER — Inpatient Hospital Stay: Payer: PPO

## 2017-07-05 ENCOUNTER — Ambulatory Visit
Admission: RE | Admit: 2017-07-05 | Discharge: 2017-07-05 | Disposition: A | Discharge status: Home / Self Care | Payer: PPO | Source: Ambulatory Visit | Attending: Radiation Oncology | Admitting: Radiation Oncology

## 2017-07-05 DIAGNOSIS — C155 Malignant neoplasm of lower third of esophagus: Secondary | ICD-10-CM | POA: Diagnosis not present

## 2017-07-05 DIAGNOSIS — Z87891 Personal history of nicotine dependence: Secondary | ICD-10-CM | POA: Diagnosis not present

## 2017-07-05 DIAGNOSIS — C160 Malignant neoplasm of cardia: Secondary | ICD-10-CM | POA: Diagnosis not present

## 2017-07-06 ENCOUNTER — Ambulatory Visit
Admission: RE | Admit: 2017-07-06 | Discharge: 2017-07-06 | Disposition: A | Discharge status: Home / Self Care | Payer: PPO | Source: Ambulatory Visit | Attending: Radiation Oncology | Admitting: Radiation Oncology

## 2017-07-06 DIAGNOSIS — C160 Malignant neoplasm of cardia: Secondary | ICD-10-CM | POA: Diagnosis not present

## 2017-07-06 DIAGNOSIS — Z87891 Personal history of nicotine dependence: Secondary | ICD-10-CM | POA: Diagnosis not present

## 2017-07-06 DIAGNOSIS — C155 Malignant neoplasm of lower third of esophagus: Secondary | ICD-10-CM | POA: Diagnosis not present

## 2017-07-09 ENCOUNTER — Ambulatory Visit
Admission: RE | Admit: 2017-07-09 | Discharge: 2017-07-09 | Disposition: A | Discharge status: Home / Self Care | Payer: PPO | Source: Ambulatory Visit | Attending: Radiation Oncology | Admitting: Radiation Oncology

## 2017-07-09 DIAGNOSIS — C155 Malignant neoplasm of lower third of esophagus: Secondary | ICD-10-CM | POA: Diagnosis not present

## 2017-07-09 DIAGNOSIS — C160 Malignant neoplasm of cardia: Secondary | ICD-10-CM | POA: Diagnosis not present

## 2017-07-09 DIAGNOSIS — Z87891 Personal history of nicotine dependence: Secondary | ICD-10-CM | POA: Diagnosis not present

## 2017-07-10 ENCOUNTER — Ambulatory Visit
Admission: RE | Admit: 2017-07-10 | Discharge: 2017-07-10 | Disposition: A | Discharge status: Home / Self Care | Payer: PPO | Source: Ambulatory Visit | Attending: Radiation Oncology | Admitting: Radiation Oncology

## 2017-07-10 DIAGNOSIS — C155 Malignant neoplasm of lower third of esophagus: Secondary | ICD-10-CM | POA: Diagnosis not present

## 2017-07-10 DIAGNOSIS — Z87891 Personal history of nicotine dependence: Secondary | ICD-10-CM | POA: Diagnosis not present

## 2017-07-10 DIAGNOSIS — C160 Malignant neoplasm of cardia: Secondary | ICD-10-CM | POA: Diagnosis not present

## 2017-07-11 ENCOUNTER — Ambulatory Visit
Admission: RE | Admit: 2017-07-11 | Discharge: 2017-07-11 | Disposition: A | Discharge status: Home / Self Care | Payer: PPO | Source: Ambulatory Visit | Attending: Radiation Oncology | Admitting: Radiation Oncology

## 2017-07-11 DIAGNOSIS — C160 Malignant neoplasm of cardia: Secondary | ICD-10-CM | POA: Diagnosis not present

## 2017-07-11 DIAGNOSIS — C155 Malignant neoplasm of lower third of esophagus: Secondary | ICD-10-CM | POA: Diagnosis not present

## 2017-07-11 DIAGNOSIS — Z87891 Personal history of nicotine dependence: Secondary | ICD-10-CM | POA: Diagnosis not present

## 2017-07-11 NOTE — Progress Notes (Signed)
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   Chief complaint/ Reason for visit- discuss immunotherapy options   Heme/Onc history: patient is a 71 year old Caucasian male who presented to the ER in June 2018with symptoms of worsening abdominal pain. He has been havving dark tarry stools for about 3 weeks prior to that. He is also had a significant weight loss of about 10 poundsover the last few months. CT abdomen done in the ER showed multiple liver metastases as well as a mass in the gastric cardia projecting into the lumen as well as adenopathy in the gastrohepatic ligament  EGD showed: Large fungating mass with bleeding at the GE junction 48 cm from the incisors. Mass was partially obstructing and partially circumferential involving one half of the lumen. Large fungating infiltrative and ulcerated and partially circumferential mass involving one half of the lumen circumference bleeding and stigmata of recent bleeding also found in the gastric cardia  Stomach mass cardia biopsy showed poorly differentiated adenocarcinoma. Her 2 neu negativeand PDL1 was expressed  CT thorax showed 7 mm lingular nodule. No evidence of bone mets on bone scan. L2 lucent lesion likely hemangioma  1st line palliative FOLFOX started on 03/27/17. PDL1 expression Positive  Ct scans after 5 cycles of FOLFOX revealed marked enlargement of gastric mass and progression of liver mets. Also showed new RML lung nodule ? mets  Interval history- feels fatigued. He is continuing to have black tarry stools. Appetite a little improved after stopping chemo. Leg swelling continues to bother him. Pain  is well controlled  ECOG PS- 2 Pain scale- 0 Opioid associated constipation- no  Review of systems- Review of Systems  Constitutional: Positive for malaise/fatigue. Negative for chills, fever and weight loss.  HENT: Negative for congestion, ear discharge and nosebleeds.   Eyes: Negative for blurred vision.  Respiratory: Negative for cough, hemoptysis, sputum production, shortness of breath and wheezing.   Cardiovascular: Negative for chest pain, palpitations, orthopnea and claudication.  Gastrointestinal: Positive for melena. Negative for abdominal pain, constipation, diarrhea, heartburn, nausea and vomiting.  Genitourinary: Negative for dysuria, flank pain, frequency, hematuria and urgency.  Musculoskeletal: Negative for back pain, joint pain and myalgias.  Skin: Negative for rash.  Neurological: Negative for dizziness, tingling, focal weakness, seizures, weakness and headaches.  Endo/Heme/Allergies: Does not bruise/bleed easily.  Psychiatric/Behavioral: Negative for depression and suicidal ideas. The patient does not have insomnia.       No Known Allergies   Past Medical History:  Diagnosis Date  . Cancer ([place])   . COPD (chronic obstructive pulmonary disease) ([place])      Past Surgical History:  Procedure Laterality Date  . ESOPHAGOGASTRODUODENOSCOPY (EGD) WITH PROPOFOL N/A 03/10/2017   Procedure: ESOPHAGOGASTRODUODENOSCOPY (EGD) WITH PROPOFOL;  Surgeon: Wilford Corner, MD;  Location: [facility];  Service: Endoscopy;  Laterality: N/A;  . KNEE ARTHROSCOPY  1991  . PORTA CATH INSERTION N/A 03/21/2017   Procedure: Glori Luis Cath Insertion;  Surgeon: Algernon Huxley, MD;  Location: [place] [facility];  Service: Cardiovascular;  Laterality: N/A;  . TONSILLECTOMY      Social History   Social History  . Marital status: Married    Spouse name: N/A  . Number of children: N/A  .  Years of education: N/A   Occupational History  . Not on file.   Social History Main Topics  .  Smoking status: Former Smoker    Quit date: 06/2010  . Smokeless tobacco: Never Used  . Alcohol use No  . Drug use: No  . Sexual activity: Not on file   Other Topics Concern  . Not on file   Social History Narrative  . No narrative on file    No family history on file.   Current Outpatient Prescriptions:  .  dexamethasone (DECADRON) 4 MG tablet, Take 2 tablets (8 mg total) by mouth daily. Start the day after chemotherapy for 2 days. Take with food., Disp: 30 tablet, Rfl: 1 .  furosemide (LASIX) 20 MG tablet, Take 1 tablet (20 mg total) by mouth daily as needed. (Patient not taking: Reported on 07/03/2017), Disp: 30 tablet, Rfl: 0 .  lidocaine-prilocaine (EMLA) cream, Apply to affected area once (Patient not taking: Reported on 06/26/2017), Disp: 30 g, Rfl: 3 .  LORazepam (ATIVAN) 0.5 MG tablet, Take 1 tablet (0.5 mg total) by mouth every 6 (six) hours as needed (Nausea or vomiting). (Patient not taking: Reported on 06/26/2017), Disp: 30 tablet, Rfl: 0 .  megestrol (MEGACE) 40 MG tablet, Take 1 tablet (40 mg total) by mouth 2 (two) times daily. (Patient not taking: Reported on 06/26/2017), Disp: 60 tablet, Rfl: 3 .  morphine (MS CONTIN) 15 MG 12 hr tablet, Take 1 tablet (15 mg total) by mouth every 12 (twelve) hours., Disp: 60 tablet, Rfl: 0 .  morphine (MSIR) 30 MG tablet, Take 1 tablet (30 mg total) by mouth every 4 (four) hours as needed for severe pain., Disp: 180 tablet, Rfl: 0 .  ondansetron (ZOFRAN) 4 MG tablet, Take 1 tablet (4 mg total) by mouth every 6 (six) hours as needed for nausea. (Patient not taking: Reported on 06/12/2017), Disp: 20 tablet, Rfl: 0 .  ondansetron (ZOFRAN) 8 MG tablet, Take 1 tablet (8 mg total) by mouth 2 (two) times daily as needed for refractory nausea / vomiting. Start on day 3 after chemotherapy. (Patient not taking: Reported on 06/12/2017), Disp: 30 tablet, Rfl: 1 .  pantoprazole (PROTONIX) 40 MG tablet, Take 1 tablet (40 mg total) by mouth 2 (two) times  daily., Disp: 60 tablet, Rfl: 2 .  prochlorperazine (COMPAZINE) 10 MG tablet, Take 1 tablet (10 mg total) by mouth every 6 (six) hours as needed (Nausea or vomiting)., Disp: 30 tablet, Rfl: 1 .  promethazine (PHENERGAN) 12.5 MG tablet, Take 1 tablet (12.5 mg total) by mouth 4 (four) times daily. (Patient not taking: Reported on 06/26/2017), Disp: 30 tablet, Rfl: 1 No current facility-administered medications for this visit.   Facility-Administered Medications Ordered in Other Visits:  .  sodium chloride flush (NS) 0.9 % injection 10 mL, 10 mL, Intracatheter, PRN, Sindy Guadeloupe, MD, 10 mL at 04/14/17 1055  Physical exam:  Vitals:   07/12/17 0856 07/12/17 0858  BP:  127/72  Pulse:  (!) 106  Resp:  14  Temp:  (!) 96.9 F (36.1 C)  TempSrc:  Tympanic  Weight: 115 lb 9.6 oz (52.4 kg)    Physical Exam  Constitutional: He is oriented to person, place, and time.  Fatigued, pale and cachectic  HENT:  Head: Normocephalic and atraumatic.  Eyes: Pupils are equal, round, and reactive to light. EOM are normal.  Neck: Normal range of motion.  Cardiovascular: Normal rate, regular rhythm and normal heart sounds.   Pulmonary/Chest: Effort normal and  breath sounds normal.  Abdominal: Soft. Bowel sounds are normal.  Musculoskeletal: He exhibits edema.  Neurological: He is alert and oriented to person, place, and time.  Skin: Skin is warm and dry.     CMP Latest Ref Rng & Units 07/12/2017  Glucose 65 - 99 mg/dL 109(H)  BUN 6 - 20 mg/dL 19  Creatinine 0.61 - 1.24 mg/dL 0.68  Sodium 135 - 145 mmol/L 134(L)  Potassium 3.5 - 5.1 mmol/L 3.9  Chloride 101 - 111 mmol/L 99(L)  CO2 22 - 32 mmol/L 27  Calcium 8.9 - 10.3 mg/dL 8.2(L)  Total Protein 6.5 - 8.1 g/dL 4.8(L)  Total Bilirubin 0.3 - 1.2 mg/dL 0.4  Alkaline Phos 38 - 126 U/L 104  AST 15 - 41 U/L 27  ALT 17 - 63 U/L 11(L)   CBC Latest Ref Rng & Units 07/12/2017  WBC 3.8 - 10.6 K/uL 10.8(H)  Hemoglobin 13.0 - 18.0 g/dL 6.0(L)  Hematocrit  40.0 - 52.0 % 18.5(L)  Platelets 150 - 440 K/uL 446(H)    No images are attached to the encounter.  Dg Chest 2 View  Result Date: 06/27/2017 CLINICAL DATA:  Cough. Patient has metastatic esophageal cancer has been receiving chemotherapy for 1 month. EXAM: CHEST  2 VIEW COMPARISON:  March 10, 2027 FINDINGS: The heart size and mediastinal contours are within normal limits. Right Center venous line is unchanged. The lungs are hyperinflated. There is no focal infiltrate, pulmonary edema, or pleural effusion. There are calcified granulomas within the left lung. The visualized skeletal structures are unremarkable. IMPRESSION: No active cardiopulmonary disease.  No focal pneumonia. Electronically Signed   By: Abelardo Diesel M.D.   On: 06/27/2017 08:36   Ct Chest W Contrast  Result Date: 06/29/2017 CLINICAL DATA:  Esophageal cancer restaging. Finished chemotherapy 2 weeks ago. EXAM: CT CHEST, ABDOMEN, AND PELVIS WITH CONTRAST TECHNIQUE: Multidetector CT imaging of the chest, abdomen and pelvis was performed following the standard protocol during bolus administration of intravenous contrast. CONTRAST:  63m ISOVUE-300 IOPAMIDOL (ISOVUE-300) INJECTION 61% COMPARISON:  Multiple exams, including 03/10/2017 and 03/09/2017 FINDINGS: CT CHEST FINDINGS Cardiovascular: Coronary, aortic arch, and branch vessel atherosclerotic vascular disease. Right Port-A-Cath tip: SVC. Mildly ectatic ascending thoracic aorta at 3.5 cm. Mediastinum/Nodes: Right paratracheal node 0.6 cm in short axis on image 9/2, stable. Small amount of contrast medium in the esophagus. Lungs/Pleura: Severe centrilobular emphysema. Biapical pleuroparenchymal scarring with associated calcifications. There is a new 1.2 by 0.9 cm nodule in the right middle lobe on image 113/4 with some associated volume loss. Nearby 5 mm nodule and 0.8 by 0.5 cm nodules medially in the right middle lobe on images 128 and 129 of series 4, respectively. 3 mm stable subpleural  nodule in the superior segment right lower lobe, image 69/4. Stable 6 mm lingular nodule, image 128/4. Calcified granuloma in the left lower lobe. Calcified granulomas in the left lower lobe. Musculoskeletal: Unremarkable CT ABDOMEN PELVIS FINDINGS Hepatobiliary: Numerous hypodense lesions are present throughout the liver, some representing cystic degeneration and necrosis of previously existing lesions, but there also enlarging lesions compatible with overall progression. For example, the right hepatic lobe lesion laterally on image 67/ 2 is complex and measures 5.8 by 3.7 cm, formerly 2.8 by 2.2 cm. The Gallbladder is mildly contracted. Stable borderline extrahepatic biliary dilatation. Pancreas: Unremarkable Spleen: Unremarkable Adrenals/Urinary Tract: Right mid kidney cyst appears stable. Several additional small hypodense right renal lesions are technically nonspecific although statistically likely to be cysts. Adrenal glands normal. Mildly distended urinary  bladder. Stomach/Bowel: The mass at the gastroesophageal junction/ gastric cardia measures 7.1 by 4.3 cm, formerly 3.3 by 2.6 cm. Vascular/Lymphatic: Dense aortoiliac atherosclerotic calcifications. Occluded left common iliac artery, as before, with reconstitution after a 3 cm occluded segment. Worsened right gastric and gastrohepatic ligament adenopathy. The right gastric native 2.1 cm in short axis on image 71/2, formerly 1.6 cm by my measurement. Gastrohepatic ligament node 1.8 cm in short axis on image 69/2, formerly 1.6 cm. Small periaortic lymph nodes are present. Reproductive: Unremarkable Other: Diffuse subcutaneous and mesenteric edema. Musculoskeletal: Degenerative disc disease and spondylosis, causing mild left foraminal stenosis at L4-5 and L5-S1. Stable small sclerotic and lucent lesions in the L2 vertebral body, probably benign. IMPRESSION: 1. Marked enlargement of the gastric mass with enlarging adjacent adenopathy. 2. Progression of liver  metastatic lesions. Although some are smaller, most are larger. Some demonstrate cystic necrosis centrally possibly from treatment effects, but others appears solid and significantly larger than before. 3. New 1.2 by 0.9 cm right middle lobe nodule. There is associated volume loss and some of this could simply be due to atelectasis, but a metastatic nodule is not excluded. There also several additional new smaller nodules in the right middle lobe, as well as several stable nodules noted previously. 4. Dense atherosclerotic calcification with chronic occlusion of the left common iliac artery and reconstitution after a 3 cm occluded segment. 5. Diffuse subcutaneous and mesenteric edema suggesting third spacing of fluid. 6. Lumbar spondylosis and degenerative disc disease. Electronically Signed   By: Van Clines M.D.   On: 06/29/2017 16:15   Ct Abdomen Pelvis W Contrast  Result Date: 06/29/2017 CLINICAL DATA:  Esophageal cancer restaging. Finished chemotherapy 2 weeks ago. EXAM: CT CHEST, ABDOMEN, AND PELVIS WITH CONTRAST TECHNIQUE: Multidetector CT imaging of the chest, abdomen and pelvis was performed following the standard protocol during bolus administration of intravenous contrast. CONTRAST:  24m ISOVUE-300 IOPAMIDOL (ISOVUE-300) INJECTION 61% COMPARISON:  Multiple exams, including 03/10/2017 and 03/09/2017 FINDINGS: CT CHEST FINDINGS Cardiovascular: Coronary, aortic arch, and branch vessel atherosclerotic vascular disease. Right Port-A-Cath tip: SVC. Mildly ectatic ascending thoracic aorta at 3.5 cm. Mediastinum/Nodes: Right paratracheal node 0.6 cm in short axis on image 9/2, stable. Small amount of contrast medium in the esophagus. Lungs/Pleura: Severe centrilobular emphysema. Biapical pleuroparenchymal scarring with associated calcifications. There is a new 1.2 by 0.9 cm nodule in the right middle lobe on image 113/4 with some associated volume loss. Nearby 5 mm nodule and 0.8 by 0.5 cm nodules  medially in the right middle lobe on images 128 and 129 of series 4, respectively. 3 mm stable subpleural nodule in the superior segment right lower lobe, image 69/4. Stable 6 mm lingular nodule, image 128/4. Calcified granuloma in the left lower lobe. Calcified granulomas in the left lower lobe. Musculoskeletal: Unremarkable CT ABDOMEN PELVIS FINDINGS Hepatobiliary: Numerous hypodense lesions are present throughout the liver, some representing cystic degeneration and necrosis of previously existing lesions, but there also enlarging lesions compatible with overall progression. For example, the right hepatic lobe lesion laterally on image 67/ 2 is complex and measures 5.8 by 3.7 cm, formerly 2.8 by 2.2 cm. The Gallbladder is mildly contracted. Stable borderline extrahepatic biliary dilatation. Pancreas: Unremarkable Spleen: Unremarkable Adrenals/Urinary Tract: Right mid kidney cyst appears stable. Several additional small hypodense right renal lesions are technically nonspecific although statistically likely to be cysts. Adrenal glands normal. Mildly distended urinary bladder. Stomach/Bowel: The mass at the gastroesophageal junction/ gastric cardia measures 7.1 by 4.3 cm, formerly 3.3 by 2.6  cm. Vascular/Lymphatic: Dense aortoiliac atherosclerotic calcifications. Occluded left common iliac artery, as before, with reconstitution after a 3 cm occluded segment. Worsened right gastric and gastrohepatic ligament adenopathy. The right gastric native 2.1 cm in short axis on image 71/2, formerly 1.6 cm by my measurement. Gastrohepatic ligament node 1.8 cm in short axis on image 69/2, formerly 1.6 cm. Small periaortic lymph nodes are present. Reproductive: Unremarkable Other: Diffuse subcutaneous and mesenteric edema. Musculoskeletal: Degenerative disc disease and spondylosis, causing mild left foraminal stenosis at L4-5 and L5-S1. Stable small sclerotic and lucent lesions in the L2 vertebral body, probably benign.  IMPRESSION: 1. Marked enlargement of the gastric mass with enlarging adjacent adenopathy. 2. Progression of liver metastatic lesions. Although some are smaller, most are larger. Some demonstrate cystic necrosis centrally possibly from treatment effects, but others appears solid and significantly larger than before. 3. New 1.2 by 0.9 cm right middle lobe nodule. There is associated volume loss and some of this could simply be due to atelectasis, but a metastatic nodule is not excluded. There also several additional new smaller nodules in the right middle lobe, as well as several stable nodules noted previously. 4. Dense atherosclerotic calcification with chronic occlusion of the left common iliac artery and reconstitution after a 3 cm occluded segment. 5. Diffuse subcutaneous and mesenteric edema suggesting third spacing of fluid. 6. Lumbar spondylosis and degenerative disc disease. Electronically Signed   By: Van Clines M.D.   On: 06/29/2017 16:15     Assessment and plan- Patient is a 71 y.o. male  with Stage IV GE junction adenocarcinoma with liver metastases and progression on FOLFOX  1. Severe symptomatic anemia likely secondary to upper GI bleed in the setting of metastatic esophageal cancer: Patient continues to have melena which likely indicates bleeding from his primary site of tumor. I will transfuse him 2 units of PRBC today. He is continuing palliative radiation. I will again speak to Dr. Baruch Gouty to see if he can stop radiation after 2 weeks so that I can proceed with second line of treatment.  2. Metastatic esophageal cancer: He has progressed within 3 months of FOLFOX. He has severe symptomatic anemia borderline performance status and is quite fatigued and cachectic. I do not think that he can tolerate second line systemic chemotherapy. I will therefore attempt to give him Keytruda 200 mg IV every 3 weeks. Discussed risks and benefits of immunotherapy including all but not limited to  fatigue, diarrhea, risk of autoimmune hepatitis and colitis. Patient understands and agrees to proceed as planned. We will obtain insurance of the right lesion and try to start chemotherapy next week. I will see him in 1 week's time with CBC, CMP and TSH are tentatively start the next cycle of Keytruda.   Neoplasm-related pain: Well controlled with when necessary and long-acting morphine   4. lower extremity edema secondary to chronic malnutrition and hypoalbuminemia: diuretics are unlikely to help and will make him more fatigued and therefore I would not like to start that     Visit Diagnosis 1. Esophageal cancer, stage IV ([place])   2. Liver metastases ([place])   3. Metastasis from esophageal cancer ([place])   4. Neoplasm related pain   5. Symptomatic anemia      Dr. Randa Evens, MD, MPH [facility] Pager- [phone] 07/12/2017 9:29 AM

## 2017-07-12 ENCOUNTER — Other Ambulatory Visit: Payer: Self-pay | Admitting: *Deleted

## 2017-07-12 ENCOUNTER — Ambulatory Visit
Admission: RE | Admit: 2017-07-12 | Discharge: 2017-07-12 | Disposition: A | Discharge status: Home / Self Care | Payer: PPO | Source: Ambulatory Visit | Attending: Radiation Oncology | Admitting: Radiation Oncology

## 2017-07-12 ENCOUNTER — Inpatient Hospital Stay: Payer: PPO

## 2017-07-12 ENCOUNTER — Encounter: Payer: Self-pay | Admitting: Oncology

## 2017-07-12 ENCOUNTER — Inpatient Hospital Stay (HOSPITAL_BASED_OUTPATIENT_CLINIC_OR_DEPARTMENT_OTHER): Payer: PPO | Admitting: Oncology

## 2017-07-12 VITALS — BP 127/72 | HR 106 | Temp 96.9°F | Resp 14 | Wt 115.6 lb

## 2017-07-12 DIAGNOSIS — C787 Secondary malignant neoplasm of liver and intrahepatic bile duct: Secondary | ICD-10-CM

## 2017-07-12 DIAGNOSIS — E43 Unspecified severe protein-calorie malnutrition: Secondary | ICD-10-CM | POA: Diagnosis not present

## 2017-07-12 DIAGNOSIS — R531 Weakness: Secondary | ICD-10-CM

## 2017-07-12 DIAGNOSIS — K5903 Drug induced constipation: Secondary | ICD-10-CM | POA: Diagnosis not present

## 2017-07-12 DIAGNOSIS — R609 Edema, unspecified: Secondary | ICD-10-CM

## 2017-07-12 DIAGNOSIS — E8809 Other disorders of plasma-protein metabolism, not elsewhere classified: Secondary | ICD-10-CM | POA: Diagnosis not present

## 2017-07-12 DIAGNOSIS — R634 Abnormal weight loss: Secondary | ICD-10-CM

## 2017-07-12 DIAGNOSIS — C159 Malignant neoplasm of esophagus, unspecified: Secondary | ICD-10-CM

## 2017-07-12 DIAGNOSIS — R5383 Other fatigue: Secondary | ICD-10-CM | POA: Diagnosis not present

## 2017-07-12 DIAGNOSIS — C155 Malignant neoplasm of lower third of esophagus: Secondary | ICD-10-CM | POA: Diagnosis not present

## 2017-07-12 DIAGNOSIS — R109 Unspecified abdominal pain: Secondary | ICD-10-CM | POA: Diagnosis not present

## 2017-07-12 DIAGNOSIS — Z9221 Personal history of antineoplastic chemotherapy: Secondary | ICD-10-CM

## 2017-07-12 DIAGNOSIS — Z23 Encounter for immunization: Secondary | ICD-10-CM

## 2017-07-12 DIAGNOSIS — R918 Other nonspecific abnormal finding of lung field: Secondary | ICD-10-CM | POA: Diagnosis not present

## 2017-07-12 DIAGNOSIS — Z87891 Personal history of nicotine dependence: Secondary | ICD-10-CM

## 2017-07-12 DIAGNOSIS — G893 Neoplasm related pain (acute) (chronic): Secondary | ICD-10-CM

## 2017-07-12 DIAGNOSIS — J449 Chronic obstructive pulmonary disease, unspecified: Secondary | ICD-10-CM

## 2017-07-12 DIAGNOSIS — C799 Secondary malignant neoplasm of unspecified site: Secondary | ICD-10-CM

## 2017-07-12 DIAGNOSIS — D649 Anemia, unspecified: Secondary | ICD-10-CM

## 2017-07-12 DIAGNOSIS — C160 Malignant neoplasm of cardia: Secondary | ICD-10-CM

## 2017-07-12 DIAGNOSIS — Z79899 Other long term (current) drug therapy: Secondary | ICD-10-CM

## 2017-07-12 LAB — COMPREHENSIVE METABOLIC PANEL
ALBUMIN: 2.3 g/dL — AB (ref 3.5–5.0)
ALK PHOS: 104 U/L (ref 38–126)
ALT: 11 U/L — AB (ref 17–63)
AST: 27 U/L (ref 15–41)
Anion gap: 8 (ref 5–15)
BILIRUBIN TOTAL: 0.4 mg/dL (ref 0.3–1.2)
BUN: 19 mg/dL (ref 6–20)
CO2: 27 mmol/L (ref 22–32)
CREATININE: 0.68 mg/dL (ref 0.61–1.24)
Calcium: 8.2 mg/dL — ABNORMAL LOW (ref 8.9–10.3)
Chloride: 99 mmol/L — ABNORMAL LOW (ref 101–111)
GFR calc Af Amer: >60 mL/min (ref >60)
GFR calc non Af Amer: >60 mL/min (ref >60)
GLUCOSE: 109 mg/dL — AB (ref 65–99)
POTASSIUM: 3.9 mmol/L (ref 3.5–5.1)
Sodium: 134 mmol/L — ABNORMAL LOW (ref 135–145)
TOTAL PROTEIN: 4.8 g/dL — AB (ref 6.5–8.1)

## 2017-07-12 LAB — CBC WITH DIFFERENTIAL/PLATELET
BASOS ABS: 0.1 10*3/uL (ref 0–0.1)
BASOS PCT: 1 %
Eosinophils Absolute: 0.2 10*3/uL (ref 0–0.7)
Eosinophils Relative: 2 %
HEMATOCRIT: 18.5 % — AB (ref 40.0–52.0)
HEMOGLOBIN: 6 g/dL — AB (ref 13.0–18.0)
LYMPHS PCT: 5 %
Lymphs Abs: 0.6 10*3/uL — ABNORMAL LOW (ref 1.0–3.6)
MCH: 30.6 pg (ref 26.0–34.0)
MCHC: 32.2 g/dL (ref 32.0–36.0)
MCV: 95 fL (ref 80.0–100.0)
Monocytes Absolute: 1.2 10*3/uL — ABNORMAL HIGH (ref 0.2–1.0)
Monocytes Relative: 11 %
NEUTROS ABS: 8.7 10*3/uL — AB (ref 1.4–6.5)
NEUTROS PCT: 81 %
Platelets: 446 10*3/uL — ABNORMAL HIGH (ref 150–440)
RBC: 1.95 MIL/uL — AB (ref 4.40–5.90)
RDW: 23.4 % — ABNORMAL HIGH (ref 11.5–14.5)
WBC: 10.8 10*3/uL — ABNORMAL HIGH (ref 3.8–10.6)

## 2017-07-12 LAB — PREPARE RBC (CROSSMATCH)

## 2017-07-12 MED ORDER — SODIUM CHLORIDE 0.9% FLUSH
3.0000 mL | INTRAVENOUS | Status: AC | PRN
  Filled 2017-07-12: qty 3

## 2017-07-12 MED ORDER — PANTOPRAZOLE SODIUM 40 MG PO TBEC: 40.0000 mg | DELAYED_RELEASE_TABLET | Freq: Two times a day (BID) | ORAL | 2 refills | Status: AC

## 2017-07-12 MED ORDER — SODIUM CHLORIDE 0.9 % IV SOLN
250.0000 mL | Freq: Once | INTRAVENOUS | Status: AC
  Administered 2017-07-12: 250 mL via INTRAVENOUS
  Filled 2017-07-12: qty 250

## 2017-07-12 MED ORDER — ACETAMINOPHEN 325 MG PO TABS
650.0000 mg | ORAL_TABLET | Freq: Once | ORAL | Status: AC
  Administered 2017-07-12: 650 mg via ORAL
  Filled 2017-07-12: qty 2

## 2017-07-12 MED ORDER — MORPHINE SULFATE 15 MG PO TABS
30.0000 mg | ORAL_TABLET | Freq: Once | ORAL | Status: AC
Start: 2017-07-12 — End: 2017-07-12
  Administered 2017-07-12: 30 mg via ORAL
  Filled 2017-07-12: qty 2

## 2017-07-12 MED ORDER — HEPARIN SOD (PORK) LOCK FLUSH 100 UNIT/ML IV SOLN
INTRAVENOUS | Status: AC
  Filled 2017-07-12: qty 5

## 2017-07-12 MED ORDER — HEPARIN SOD (PORK) LOCK FLUSH 100 UNIT/ML IV SOLN
500.0000 [IU] | Freq: Every day | INTRAVENOUS | Status: AC | PRN
  Administered 2017-07-12: 500 [IU]

## 2017-07-12 MED ORDER — MORPHINE SULFATE ER 15 MG PO TBCR: 15.0000 mg | EXTENDED_RELEASE_TABLET | Freq: Two times a day (BID) | ORAL | 0 refills | Status: DC

## 2017-07-12 NOTE — Progress Notes (Signed)
Patient states that he is feeling very tired all the time. He spends a lot of time laying on couch and has trouble staying awake. He states that the swelling in his lower legs is worse, but he does not take the Lasix because he doesn't think it helps. He is asking for refills on the Protonix and the Morphine Q 12 hrs.

## 2017-07-13 ENCOUNTER — Ambulatory Visit: Admission: RE | Admit: 2017-07-13 | Payer: PPO | Source: Ambulatory Visit

## 2017-07-13 LAB — TYPE AND SCREEN
ABO/RH(D): AB POS
ANTIBODY SCREEN: NEGATIVE
UNIT DIVISION: 0
UNIT DIVISION: 0

## 2017-07-13 LAB — BPAM RBC
BLOOD PRODUCT EXPIRATION DATE: 201810242359
Blood Product Expiration Date: 201810172359
ISSUE DATE / TIME: 201810111149
ISSUE DATE / TIME: 201810111405
Unit Type and Rh: 5100
Unit Type and Rh: 6200

## 2017-07-16 ENCOUNTER — Ambulatory Visit
Admission: RE | Admit: 2017-07-16 | Discharge: 2017-07-16 | Disposition: A | Discharge status: Home / Self Care | Payer: PPO | Source: Ambulatory Visit | Attending: Radiation Oncology | Admitting: Radiation Oncology

## 2017-07-16 DIAGNOSIS — Z87891 Personal history of nicotine dependence: Secondary | ICD-10-CM | POA: Diagnosis not present

## 2017-07-16 DIAGNOSIS — C155 Malignant neoplasm of lower third of esophagus: Secondary | ICD-10-CM | POA: Diagnosis not present

## 2017-07-16 DIAGNOSIS — C160 Malignant neoplasm of cardia: Secondary | ICD-10-CM | POA: Diagnosis not present

## 2017-07-17 ENCOUNTER — Other Ambulatory Visit: Payer: Self-pay | Admitting: Oncology

## 2017-07-17 ENCOUNTER — Ambulatory Visit
Admission: RE | Admit: 2017-07-17 | Discharge: 2017-07-17 | Disposition: A | Discharge status: Home / Self Care | Payer: PPO | Source: Ambulatory Visit | Attending: Radiation Oncology | Admitting: Radiation Oncology

## 2017-07-17 DIAGNOSIS — Z87891 Personal history of nicotine dependence: Secondary | ICD-10-CM | POA: Diagnosis not present

## 2017-07-17 DIAGNOSIS — C160 Malignant neoplasm of cardia: Secondary | ICD-10-CM | POA: Diagnosis not present

## 2017-07-17 DIAGNOSIS — C155 Malignant neoplasm of lower third of esophagus: Secondary | ICD-10-CM | POA: Diagnosis not present

## 2017-07-18 ENCOUNTER — Ambulatory Visit
Admission: RE | Admit: 2017-07-18 | Discharge: 2017-07-18 | Disposition: A | Discharge status: Home / Self Care | Payer: PPO | Source: Ambulatory Visit | Attending: Radiation Oncology | Admitting: Radiation Oncology

## 2017-07-18 DIAGNOSIS — C155 Malignant neoplasm of lower third of esophagus: Secondary | ICD-10-CM | POA: Diagnosis not present

## 2017-07-18 DIAGNOSIS — C160 Malignant neoplasm of cardia: Secondary | ICD-10-CM | POA: Diagnosis not present

## 2017-07-18 DIAGNOSIS — Z87891 Personal history of nicotine dependence: Secondary | ICD-10-CM | POA: Diagnosis not present

## 2017-07-19 ENCOUNTER — Ambulatory Visit
Admission: RE | Admit: 2017-07-19 | Discharge: 2017-07-19 | Disposition: A | Discharge status: Home / Self Care | Payer: PPO | Source: Ambulatory Visit | Attending: Radiation Oncology | Admitting: Radiation Oncology

## 2017-07-19 ENCOUNTER — Other Ambulatory Visit: Payer: Self-pay | Admitting: Oncology

## 2017-07-19 DIAGNOSIS — C155 Malignant neoplasm of lower third of esophagus: Secondary | ICD-10-CM | POA: Diagnosis not present

## 2017-07-19 DIAGNOSIS — Z87891 Personal history of nicotine dependence: Secondary | ICD-10-CM | POA: Diagnosis not present

## 2017-07-19 DIAGNOSIS — C160 Malignant neoplasm of cardia: Secondary | ICD-10-CM | POA: Diagnosis not present

## 2017-07-20 ENCOUNTER — Inpatient Hospital Stay: Payer: PPO

## 2017-07-20 ENCOUNTER — Ambulatory Visit: Payer: PPO

## 2017-07-20 ENCOUNTER — Inpatient Hospital Stay (HOSPITAL_BASED_OUTPATIENT_CLINIC_OR_DEPARTMENT_OTHER): Payer: PPO | Admitting: Oncology

## 2017-07-20 VITALS — BP 132/86 | HR 101 | Temp 97.1°F | Resp 16 | Wt 115.3 lb

## 2017-07-20 DIAGNOSIS — Z23 Encounter for immunization: Secondary | ICD-10-CM

## 2017-07-20 DIAGNOSIS — C160 Malignant neoplasm of cardia: Secondary | ICD-10-CM

## 2017-07-20 DIAGNOSIS — R609 Edema, unspecified: Secondary | ICD-10-CM

## 2017-07-20 DIAGNOSIS — C159 Malignant neoplasm of esophagus, unspecified: Secondary | ICD-10-CM

## 2017-07-20 DIAGNOSIS — E778 Other disorders of glycoprotein metabolism: Secondary | ICD-10-CM | POA: Diagnosis not present

## 2017-07-20 DIAGNOSIS — R634 Abnormal weight loss: Secondary | ICD-10-CM

## 2017-07-20 DIAGNOSIS — R918 Other nonspecific abnormal finding of lung field: Secondary | ICD-10-CM | POA: Diagnosis not present

## 2017-07-20 DIAGNOSIS — C787 Secondary malignant neoplasm of liver and intrahepatic bile duct: Secondary | ICD-10-CM

## 2017-07-20 DIAGNOSIS — Z79899 Other long term (current) drug therapy: Secondary | ICD-10-CM

## 2017-07-20 DIAGNOSIS — R5383 Other fatigue: Secondary | ICD-10-CM | POA: Diagnosis not present

## 2017-07-20 DIAGNOSIS — Z5112 Encounter for antineoplastic immunotherapy: Secondary | ICD-10-CM

## 2017-07-20 DIAGNOSIS — E46 Unspecified protein-calorie malnutrition: Secondary | ICD-10-CM

## 2017-07-20 DIAGNOSIS — Z87891 Personal history of nicotine dependence: Secondary | ICD-10-CM

## 2017-07-20 DIAGNOSIS — G893 Neoplasm related pain (acute) (chronic): Secondary | ICD-10-CM | POA: Diagnosis not present

## 2017-07-20 DIAGNOSIS — E8809 Other disorders of plasma-protein metabolism, not elsewhere classified: Secondary | ICD-10-CM | POA: Diagnosis not present

## 2017-07-20 DIAGNOSIS — C799 Secondary malignant neoplasm of unspecified site: Secondary | ICD-10-CM

## 2017-07-20 DIAGNOSIS — J449 Chronic obstructive pulmonary disease, unspecified: Secondary | ICD-10-CM

## 2017-07-20 DIAGNOSIS — R531 Weakness: Secondary | ICD-10-CM

## 2017-07-20 DIAGNOSIS — R109 Unspecified abdominal pain: Secondary | ICD-10-CM

## 2017-07-20 DIAGNOSIS — Z9221 Personal history of antineoplastic chemotherapy: Secondary | ICD-10-CM

## 2017-07-20 DIAGNOSIS — D6481 Anemia due to antineoplastic chemotherapy: Secondary | ICD-10-CM

## 2017-07-20 LAB — CBC WITH DIFFERENTIAL/PLATELET
BASOS ABS: 0.1 10*3/uL (ref 0–0.1)
BASOS PCT: 1 %
EOS ABS: 0.1 10*3/uL (ref 0–0.7)
EOS PCT: 1 %
HEMATOCRIT: 26.2 % — AB (ref 40.0–52.0)
Hemoglobin: 8.5 g/dL — ABNORMAL LOW (ref 13.0–18.0)
Lymphocytes Relative: 5 %
Lymphs Abs: 0.6 10*3/uL — ABNORMAL LOW (ref 1.0–3.6)
MCH: 29.5 pg (ref 26.0–34.0)
MCHC: 32.6 g/dL (ref 32.0–36.0)
MCV: 90.3 fL (ref 80.0–100.0)
MONO ABS: 1.1 10*3/uL — AB (ref 0.2–1.0)
MONOS PCT: 8 %
Neutro Abs: 12.2 10*3/uL — ABNORMAL HIGH (ref 1.4–6.5)
Neutrophils Relative %: 87 %
PLATELETS: 331 10*3/uL (ref 150–440)
RBC: 2.9 MIL/uL — ABNORMAL LOW (ref 4.40–5.90)
RDW: 17.6 % — AB (ref 11.5–14.5)
WBC: 14.1 10*3/uL — ABNORMAL HIGH (ref 3.8–10.6)

## 2017-07-20 LAB — COMPREHENSIVE METABOLIC PANEL
ALBUMIN: 2.2 g/dL — AB (ref 3.5–5.0)
ALT: 10 U/L — ABNORMAL LOW (ref 17–63)
ANION GAP: 9 (ref 5–15)
AST: 25 U/L (ref 15–41)
Alkaline Phosphatase: 100 U/L (ref 38–126)
BILIRUBIN TOTAL: 0.3 mg/dL (ref 0.3–1.2)
BUN: 12 mg/dL (ref 6–20)
CHLORIDE: 100 mmol/L — AB (ref 101–111)
CO2: 25 mmol/L (ref 22–32)
Calcium: 8.3 mg/dL — ABNORMAL LOW (ref 8.9–10.3)
Creatinine, Ser: 0.52 mg/dL — ABNORMAL LOW (ref 0.61–1.24)
GFR calc Af Amer: >60 mL/min (ref >60)
GFR calc non Af Amer: >60 mL/min (ref >60)
GLUCOSE: 110 mg/dL — AB (ref 65–99)
POTASSIUM: 3.9 mmol/L (ref 3.5–5.1)
SODIUM: 134 mmol/L — AB (ref 135–145)
TOTAL PROTEIN: 4.8 g/dL — AB (ref 6.5–8.1)

## 2017-07-20 LAB — TSH: TSH: 4.774 u[IU]/mL — ABNORMAL HIGH (ref 0.350–4.500)

## 2017-07-20 MED ORDER — HEPARIN SOD (PORK) LOCK FLUSH 100 UNIT/ML IV SOLN
500.0000 [IU] | Freq: Once | INTRAVENOUS | Status: AC | PRN
  Administered 2017-07-20: 500 [IU]
  Filled 2017-07-20: qty 5

## 2017-07-20 MED ORDER — SODIUM CHLORIDE 0.9 % IV SOLN
200.0000 mg | Freq: Once | INTRAVENOUS | Status: AC
  Administered 2017-07-20: 200 mg via INTRAVENOUS
  Filled 2017-07-20: qty 8

## 2017-07-20 MED ORDER — SODIUM CHLORIDE 0.9 % IV SOLN
Freq: Once | INTRAVENOUS | Status: AC
  Administered 2017-07-20: 11:00:00 via INTRAVENOUS
  Filled 2017-07-20: qty 1000

## 2017-07-20 NOTE — Progress Notes (Signed)
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   Chief complaint/ Reason for visit- on treatment assessment prior to cycle 1 of keytruda   Heme/Onc history: patient is a 71 year old Caucasian male who presented to the ER in June 2018with symptoms of worsening abdominal pain. He has been havving dark tarry stools for about 3 weeks prior to that. He is also had a significant weight loss of about 10 poundsover the last few months. CT abdomen done in the ER showed multiple liver metastases as well as a mass in the gastric cardia projecting into the lumen as well as adenopathy in the gastrohepatic ligament  EGD showed: Large fungating mass with bleeding at the GE junction 48 cm from the incisors. Mass was partially obstructing and partially circumferential involving one half of the lumen. Large fungating infiltrative and ulcerated and partially circumferential mass involving one half of the lumen circumference bleeding and stigmata of recent bleeding also found in the gastric cardia  Stomach mass cardia biopsy showed poorly differentiated adenocarcinoma. Her 2 neu negativeand PDL1 was expressed  CT thorax showed 7 mm lingular nodule. No evidence of bone mets on bone scan. L2 lucent lesion likely hemangioma  1st line palliative FOLFOX started on 03/27/17. PDL1 expression Positive  Ct scans after 5 cycles of FOLFOX revealed marked enlargement of gastric mass and progression of liver mets. Also showed new RML lung nodule ? Mets  Given patients borderline performance status to tolerate chemotherapy, plan was to proceed with 2nd line immunotherapy with [place] given that he  was PDL1 positive. He did receive palliative RT to his stomach for 10 days  Interval history- continues to have leg swelling. Pain well controlled with pain meds. Feels fatigued. Still has on and off black stools  ECOG PS- 2 Pain scale- 0 Opioid associated constipation- no  Review of systems- Review of Systems  Constitutional: Positive for malaise/fatigue. Negative for chills, fever and weight loss.  HENT: Negative for congestion, ear discharge and nosebleeds.   Eyes: Negative for blurred vision.  Respiratory: Negative for cough, hemoptysis, sputum production, shortness of breath and wheezing.   Cardiovascular: Positive for leg swelling. Negative for chest pain, palpitations, orthopnea and claudication.  Gastrointestinal: Positive for abdominal pain. Negative for blood in stool, constipation, diarrhea, heartburn, melena, nausea and vomiting.  Genitourinary: Negative for dysuria, flank pain, frequency, hematuria and urgency.  Musculoskeletal: Negative for back pain, joint pain and myalgias.  Skin: Negative for rash.  Neurological: Negative for dizziness, tingling, focal weakness, seizures, weakness and headaches.  Endo/Heme/Allergies: Does not bruise/bleed easily.  Psychiatric/Behavioral: Negative for depression and suicidal ideas. The patient does not have insomnia.       No Known Allergies   Past Medical History:  Diagnosis Date  . Cancer ([place])   . COPD (chronic obstructive pulmonary disease) ([place])      Past Surgical History:  Procedure Laterality Date  . ESOPHAGOGASTRODUODENOSCOPY (EGD) WITH PROPOFOL N/A 03/10/2017   Procedure: ESOPHAGOGASTRODUODENOSCOPY (EGD) WITH PROPOFOL;  Surgeon: Wilford Corner, MD;  Location: [facility] ENDOSCOPY;  Service: Endoscopy;  Laterality: N/A;  . KNEE ARTHROSCOPY  1991  . PORTA CATH INSERTION N/A 03/21/2017   Procedure: Glori Luis Cath Insertion;  Surgeon: Algernon Huxley, MD;  Location: [place] CV LAB;  Service: Cardiovascular;  Laterality: N/A;  .  TONSILLECTOMY      Social History   Social History  . Marital status: Married    Spouse name: N/A  . Number of children: N/A  . Years of education: N/A   Occupational History  . Not on file.   Social History Main Topics  . Smoking status: Former Smoker    Quit date: 06/2010  . Smokeless tobacco: Never Used  . Alcohol use No  . Drug use: No  . Sexual activity: Not on file   Other Topics Concern  . Not on file   Social History Narrative  . No narrative on file    No family history on file.   Current Outpatient Prescriptions:  .  morphine (MS CONTIN) 15 MG 12 hr tablet, Take 1 tablet (15 mg total) by mouth every 12 (twelve) hours., Disp: 60 tablet, Rfl: 0 .  morphine (MSIR) 30 MG tablet, Take 1 tablet (30 mg total) by mouth every 4 (four) hours as needed for severe pain., Disp: 180 tablet, Rfl: 0 .  ondansetron (ZOFRAN) 4 MG tablet, Take 1 tablet (4 mg total) by mouth every 6 (six) hours as needed for nausea., Disp: 20 tablet, Rfl: 0 .  pantoprazole (PROTONIX) 40 MG tablet, Take 1 tablet (40 mg total) by mouth 2 (two) times daily., Disp: 60 tablet, Rfl: 2 .  promethazine (PHENERGAN) 12.5 MG tablet, Take 1 tablet (12.5 mg total) by mouth 4 (four) times daily., Disp: 30 tablet, Rfl: 1 .  furosemide (LASIX) 20 MG tablet, Take 1 tablet (20 mg total) by mouth daily as needed. (Patient not taking: Reported on 07/03/2017), Disp: 30 tablet, Rfl: 0 .  megestrol (MEGACE) 40 MG tablet, Take 1 tablet (40 mg total) by mouth 2 (two) times daily. (Patient not taking: Reported on 06/26/2017), Disp: 60 tablet, Rfl: 3 .  ondansetron (ZOFRAN) 8 MG tablet, Take 1 tablet (8 mg total) by mouth 2 (two) times daily as needed for refractory nausea / vomiting. Start on day 3 after chemotherapy. (Patient not taking: Reported on 07/20/2017), Disp: 30 tablet, Rfl: 1 .  prochlorperazine (COMPAZINE) 10 MG tablet, Take 1 tablet (10 mg total) by mouth every 6 (six) hours as needed (Nausea or vomiting). (Patient  not taking: Reported on 07/20/2017), Disp: 30 tablet, Rfl: 1 No current facility-administered medications for this visit.   Facility-Administered Medications Ordered in Other Visits:  .  sodium chloride flush (NS) 0.9 % injection 3 mL, 3 mL, Intracatheter, PRN, Sindy Guadeloupe, MD  Physical exam:  Vitals:   07/20/17 1005  BP: 132/86  Pulse: (!) 101  Resp: 16  Temp: (!) 97.1 F (36.2 C)  TempSrc: Tympanic  Weight: 115 lb 4.8 oz (52.3 kg)   Physical Exam  Constitutional: He is oriented to person, place, and time.  Thin and cachectic. Appears in no acute distress  HENT:  Head: Normocephalic and atraumatic.  Eyes: Pupils are equal, round, and reactive to light. EOM are normal.  Neck: Normal range of motion.  Cardiovascular: Normal rate, regular rhythm and normal heart sounds.   Pulmonary/Chest: Effort normal and breath sounds normal.  Abdominal: Soft. Bowel sounds are normal.  Musculoskeletal: He exhibits edema.  Neurological: He is alert and oriented to person, place, and time.  Skin: Skin is warm and dry.     CMP Latest Ref Rng & Units 07/20/2017  Glucose 65 - 99 mg/dL 110(H)  BUN 6 - 20 mg/dL 12  Creatinine 0.61 - 1.24 mg/dL 0.52(L)  Sodium 135 - 145 mmol/L 134(L)  Potassium 3.5 - 5.1 mmol/L 3.9  Chloride 101 - 111 mmol/L 100(L)  CO2 22 - 32 mmol/L 25  Calcium 8.9 - 10.3 mg/dL 8.3(L)  Total Protein 6.5 - 8.1 g/dL 4.8(L)  Total Bilirubin 0.3 - 1.2 mg/dL 0.3  Alkaline Phos 38 - 126 U/L 100  AST 15 - 41 U/L 25  ALT 17 - 63 U/L 10(L)   CBC Latest Ref Rng & Units 07/20/2017  WBC 3.8 - 10.6 K/uL 14.1(H)  Hemoglobin 13.0 - 18.0 g/dL 8.5(L)  Hematocrit 40.0 - 52.0 % 26.2(L)  Platelets 150 - 440 K/uL 331    No images are attached to the encounter.  Dg Chest 2 View  Result Date: 06/27/2017 CLINICAL DATA:  Cough. Patient has metastatic esophageal cancer has been receiving chemotherapy for 1 month. EXAM: CHEST  2 VIEW COMPARISON:  March 10, 2027 FINDINGS: The heart size and  mediastinal contours are within normal limits. Right Center venous line is unchanged. The lungs are hyperinflated. There is no focal infiltrate, pulmonary edema, or pleural effusion. There are calcified granulomas within the left lung. The visualized skeletal structures are unremarkable. IMPRESSION: No active cardiopulmonary disease.  No focal pneumonia. Electronically Signed   By: Abelardo Diesel M.D.   On: 06/27/2017 08:36   Ct Chest W Contrast  Result Date: 06/29/2017 CLINICAL DATA:  Esophageal cancer restaging. Finished chemotherapy 2 weeks ago. EXAM: CT CHEST, ABDOMEN, AND PELVIS WITH CONTRAST TECHNIQUE: Multidetector CT imaging of the chest, abdomen and pelvis was performed following the standard protocol during bolus administration of intravenous contrast. CONTRAST:  67m ISOVUE-300 IOPAMIDOL (ISOVUE-300) INJECTION 61% COMPARISON:  Multiple exams, including 03/10/2017 and 03/09/2017 FINDINGS: CT CHEST FINDINGS Cardiovascular: Coronary, aortic arch, and branch vessel atherosclerotic vascular disease. Right Port-A-Cath tip: SVC. Mildly ectatic ascending thoracic aorta at 3.5 cm. Mediastinum/Nodes: Right paratracheal node 0.6 cm in short axis on image 9/2, stable. Small amount of contrast medium in the esophagus. Lungs/Pleura: Severe centrilobular emphysema. Biapical pleuroparenchymal scarring with associated calcifications. There is a new 1.2 by 0.9 cm nodule in the right middle lobe on image 113/4 with some associated volume loss. Nearby 5 mm nodule and 0.8 by 0.5 cm nodules medially in the right middle lobe on images 128 and 129 of series 4, respectively. 3 mm stable subpleural nodule in the superior segment right lower lobe, image 69/4. Stable 6 mm lingular nodule, image 128/4. Calcified granuloma in the left lower lobe. Calcified granulomas in the left lower lobe. Musculoskeletal: Unremarkable CT ABDOMEN PELVIS FINDINGS Hepatobiliary: Numerous hypodense lesions are present throughout the liver, some  representing cystic degeneration and necrosis of previously existing lesions, but there also enlarging lesions compatible with overall progression. For example, the right hepatic lobe lesion laterally on image 67/ 2 is complex and measures 5.8 by 3.7 cm, formerly 2.8 by 2.2 cm. The Gallbladder is mildly contracted. Stable borderline extrahepatic biliary dilatation. Pancreas: Unremarkable Spleen: Unremarkable Adrenals/Urinary Tract: Right mid kidney cyst appears stable. Several additional small hypodense right renal lesions are technically nonspecific although statistically likely to be cysts. Adrenal glands normal. Mildly distended urinary bladder. Stomach/Bowel: The mass at the gastroesophageal junction/ gastric cardia measures 7.1 by 4.3 cm, formerly 3.3 by 2.6 cm. Vascular/Lymphatic: Dense aortoiliac atherosclerotic calcifications. Occluded left common iliac artery, as before, with reconstitution after a 3 cm occluded segment. Worsened right gastric and gastrohepatic ligament adenopathy. The right gastric native 2.1 cm in short axis on image 71/2, formerly  1.6 cm by my measurement. Gastrohepatic ligament node 1.8 cm in short axis on image 69/2, formerly 1.6 cm. Small periaortic lymph nodes are present. Reproductive: Unremarkable Other: Diffuse subcutaneous and mesenteric edema. Musculoskeletal: Degenerative disc disease and spondylosis, causing mild left foraminal stenosis at L4-5 and L5-S1. Stable small sclerotic and lucent lesions in the L2 vertebral body, probably benign. IMPRESSION: 1. Marked enlargement of the gastric mass with enlarging adjacent adenopathy. 2. Progression of liver metastatic lesions. Although some are smaller, most are larger. Some demonstrate cystic necrosis centrally possibly from treatment effects, but others appears solid and significantly larger than before. 3. New 1.2 by 0.9 cm right middle lobe nodule. There is associated volume loss and some of this could simply be due to  atelectasis, but a metastatic nodule is not excluded. There also several additional new smaller nodules in the right middle lobe, as well as several stable nodules noted previously. 4. Dense atherosclerotic calcification with chronic occlusion of the left common iliac artery and reconstitution after a 3 cm occluded segment. 5. Diffuse subcutaneous and mesenteric edema suggesting third spacing of fluid. 6. Lumbar spondylosis and degenerative disc disease. Electronically Signed   By: Van Clines M.D.   On: 06/29/2017 16:15   Ct Abdomen Pelvis W Contrast  Result Date: 06/29/2017 CLINICAL DATA:  Esophageal cancer restaging. Finished chemotherapy 2 weeks ago. EXAM: CT CHEST, ABDOMEN, AND PELVIS WITH CONTRAST TECHNIQUE: Multidetector CT imaging of the chest, abdomen and pelvis was performed following the standard protocol during bolus administration of intravenous contrast. CONTRAST:  39m ISOVUE-300 IOPAMIDOL (ISOVUE-300) INJECTION 61% COMPARISON:  Multiple exams, including 03/10/2017 and 03/09/2017 FINDINGS: CT CHEST FINDINGS Cardiovascular: Coronary, aortic arch, and branch vessel atherosclerotic vascular disease. Right Port-A-Cath tip: SVC. Mildly ectatic ascending thoracic aorta at 3.5 cm. Mediastinum/Nodes: Right paratracheal node 0.6 cm in short axis on image 9/2, stable. Small amount of contrast medium in the esophagus. Lungs/Pleura: Severe centrilobular emphysema. Biapical pleuroparenchymal scarring with associated calcifications. There is a new 1.2 by 0.9 cm nodule in the right middle lobe on image 113/4 with some associated volume loss. Nearby 5 mm nodule and 0.8 by 0.5 cm nodules medially in the right middle lobe on images 128 and 129 of series 4, respectively. 3 mm stable subpleural nodule in the superior segment right lower lobe, image 69/4. Stable 6 mm lingular nodule, image 128/4. Calcified granuloma in the left lower lobe. Calcified granulomas in the left lower lobe. Musculoskeletal:  Unremarkable CT ABDOMEN PELVIS FINDINGS Hepatobiliary: Numerous hypodense lesions are present throughout the liver, some representing cystic degeneration and necrosis of previously existing lesions, but there also enlarging lesions compatible with overall progression. For example, the right hepatic lobe lesion laterally on image 67/ 2 is complex and measures 5.8 by 3.7 cm, formerly 2.8 by 2.2 cm. The Gallbladder is mildly contracted. Stable borderline extrahepatic biliary dilatation. Pancreas: Unremarkable Spleen: Unremarkable Adrenals/Urinary Tract: Right mid kidney cyst appears stable. Several additional small hypodense right renal lesions are technically nonspecific although statistically likely to be cysts. Adrenal glands normal. Mildly distended urinary bladder. Stomach/Bowel: The mass at the gastroesophageal junction/ gastric cardia measures 7.1 by 4.3 cm, formerly 3.3 by 2.6 cm. Vascular/Lymphatic: Dense aortoiliac atherosclerotic calcifications. Occluded left common iliac artery, as before, with reconstitution after a 3 cm occluded segment. Worsened right gastric and gastrohepatic ligament adenopathy. The right gastric native 2.1 cm in short axis on image 71/2, formerly 1.6 cm by my measurement. Gastrohepatic ligament node 1.8 cm in short axis on image 69/2, formerly 1.6 cm.  Small periaortic lymph nodes are present. Reproductive: Unremarkable Other: Diffuse subcutaneous and mesenteric edema. Musculoskeletal: Degenerative disc disease and spondylosis, causing mild left foraminal stenosis at L4-5 and L5-S1. Stable small sclerotic and lucent lesions in the L2 vertebral body, probably benign. IMPRESSION: 1. Marked enlargement of the gastric mass with enlarging adjacent adenopathy. 2. Progression of liver metastatic lesions. Although some are smaller, most are larger. Some demonstrate cystic necrosis centrally possibly from treatment effects, but others appears solid and significantly larger than before. 3. New  1.2 by 0.9 cm right middle lobe nodule. There is associated volume loss and some of this could simply be due to atelectasis, but a metastatic nodule is not excluded. There also several additional new smaller nodules in the right middle lobe, as well as several stable nodules noted previously. 4. Dense atherosclerotic calcification with chronic occlusion of the left common iliac artery and reconstitution after a 3 cm occluded segment. 5. Diffuse subcutaneous and mesenteric edema suggesting third spacing of fluid. 6. Lumbar spondylosis and degenerative disc disease. Electronically Signed   By: Van Clines M.D.   On: 06/29/2017 16:15     Assessment and plan- Patient is a 71 y.o. male with Stage IV GE junction adenocarcinoma with liver metastases and progression on FOLFOX here for on treatment assessment prior to cycle 1 of keytruda  Counts okay to proceed with cycle #1 of treatment better today. TSH is mildly elevated. We will at T3 and T4 to the next set of labs. I will see him back in 3 weeks' time. He will call us if he has any questions or concerns in the interim  Anemia secondary to ongoing upper GI bleed from malignancy as well as chemotherapy-induced anemia.his hemoglobin is 8.5 today and he does not need blood transfusion. However, if it were to go down again I will consider referring him to GI to see if there would be any role for EGD to palliate his symptoms  Neoplasm-related pain and continue when necessary long-acting morphine.  Bilateral lower extremity edema secondary to severe hypoproteinemia from malignancy.  Overall prognosis is poor. Patient is a DO NOT RESUSCITATE   Visit Diagnosis 1. Esophageal cancer, stage IV ([place])   2. Liver metastases ([place])   3. Neoplasm related pain   4. Encounter for antineoplastic immunotherapy      Dr. Randa Evens, MD, MPH [facility] at [place] [place] Pager- [phone] 07/20/2017 12:35 PM

## 2017-07-23 ENCOUNTER — Ambulatory Visit: Payer: PPO

## 2017-07-24 ENCOUNTER — Other Ambulatory Visit: Payer: Self-pay | Admitting: Oncology

## 2017-07-24 ENCOUNTER — Ambulatory Visit: Payer: PPO

## 2017-07-24 DIAGNOSIS — C159 Malignant neoplasm of esophagus, unspecified: Secondary | ICD-10-CM

## 2017-07-25 ENCOUNTER — Ambulatory Visit: Payer: PPO

## 2017-07-26 ENCOUNTER — Ambulatory Visit: Payer: PPO

## 2017-07-27 ENCOUNTER — Ambulatory Visit: Payer: PPO

## 2017-07-30 ENCOUNTER — Ambulatory Visit: Payer: PPO

## 2017-07-31 ENCOUNTER — Ambulatory Visit: Payer: PPO

## 2017-08-01 ENCOUNTER — Ambulatory Visit: Payer: PPO

## 2017-08-02 ENCOUNTER — Ambulatory Visit: Payer: PPO

## 2017-08-06 ENCOUNTER — Other Ambulatory Visit: Payer: Self-pay | Admitting: *Deleted

## 2017-08-06 ENCOUNTER — Other Ambulatory Visit: Payer: Self-pay | Admitting: Oncology

## 2017-08-06 DIAGNOSIS — C159 Malignant neoplasm of esophagus, unspecified: Secondary | ICD-10-CM

## 2017-08-10 ENCOUNTER — Inpatient Hospital Stay: Payer: PPO

## 2017-08-10 ENCOUNTER — Other Ambulatory Visit: Payer: Self-pay | Admitting: *Deleted

## 2017-08-10 ENCOUNTER — Inpatient Hospital Stay: Payer: PPO | Attending: Oncology | Admitting: Oncology

## 2017-08-10 ENCOUNTER — Encounter: Payer: Self-pay | Admitting: Oncology

## 2017-08-10 VITALS — BP 134/80 | HR 69 | Temp 95.9°F | Resp 20

## 2017-08-10 VITALS — BP 128/78 | HR 91 | Temp 96.0°F | Resp 14 | Wt 117.0 lb

## 2017-08-10 DIAGNOSIS — R531 Weakness: Secondary | ICD-10-CM | POA: Diagnosis not present

## 2017-08-10 DIAGNOSIS — Z87891 Personal history of nicotine dependence: Secondary | ICD-10-CM | POA: Diagnosis not present

## 2017-08-10 DIAGNOSIS — C160 Malignant neoplasm of cardia: Secondary | ICD-10-CM

## 2017-08-10 DIAGNOSIS — R5383 Other fatigue: Secondary | ICD-10-CM

## 2017-08-10 DIAGNOSIS — C799 Secondary malignant neoplasm of unspecified site: Secondary | ICD-10-CM

## 2017-08-10 DIAGNOSIS — G893 Neoplasm related pain (acute) (chronic): Secondary | ICD-10-CM

## 2017-08-10 DIAGNOSIS — R634 Abnormal weight loss: Secondary | ICD-10-CM | POA: Diagnosis not present

## 2017-08-10 DIAGNOSIS — Z79899 Other long term (current) drug therapy: Secondary | ICD-10-CM | POA: Diagnosis not present

## 2017-08-10 DIAGNOSIS — C159 Malignant neoplasm of esophagus, unspecified: Secondary | ICD-10-CM

## 2017-08-10 DIAGNOSIS — C787 Secondary malignant neoplasm of liver and intrahepatic bile duct: Secondary | ICD-10-CM

## 2017-08-10 DIAGNOSIS — Z9221 Personal history of antineoplastic chemotherapy: Secondary | ICD-10-CM | POA: Diagnosis not present

## 2017-08-10 DIAGNOSIS — R609 Edema, unspecified: Secondary | ICD-10-CM | POA: Diagnosis not present

## 2017-08-10 DIAGNOSIS — Z5112 Encounter for antineoplastic immunotherapy: Secondary | ICD-10-CM

## 2017-08-10 DIAGNOSIS — D649 Anemia, unspecified: Secondary | ICD-10-CM

## 2017-08-10 DIAGNOSIS — R918 Other nonspecific abnormal finding of lung field: Secondary | ICD-10-CM

## 2017-08-10 DIAGNOSIS — J449 Chronic obstructive pulmonary disease, unspecified: Secondary | ICD-10-CM

## 2017-08-10 DIAGNOSIS — D500 Iron deficiency anemia secondary to blood loss (chronic): Secondary | ICD-10-CM | POA: Diagnosis not present

## 2017-08-10 DIAGNOSIS — Z923 Personal history of irradiation: Secondary | ICD-10-CM | POA: Diagnosis not present

## 2017-08-10 LAB — COMPREHENSIVE METABOLIC PANEL
ALBUMIN: 2 g/dL — AB (ref 3.5–5.0)
ALT: 10 U/L — AB (ref 17–63)
AST: 25 U/L (ref 15–41)
Alkaline Phosphatase: 109 U/L (ref 38–126)
Anion gap: 5 (ref 5–15)
BUN: 15 mg/dL (ref 6–20)
CHLORIDE: 100 mmol/L — AB (ref 101–111)
CO2: 27 mmol/L (ref 22–32)
CREATININE: 0.56 mg/dL — AB (ref 0.61–1.24)
Calcium: 7.8 mg/dL — ABNORMAL LOW (ref 8.9–10.3)
GFR calc Af Amer: >60 mL/min (ref >60)
GFR calc non Af Amer: >60 mL/min (ref >60)
GLUCOSE: 130 mg/dL — AB (ref 65–99)
POTASSIUM: 3.8 mmol/L (ref 3.5–5.1)
SODIUM: 132 mmol/L — AB (ref 135–145)
Total Bilirubin: 0.3 mg/dL (ref 0.3–1.2)
Total Protein: 4.4 g/dL — ABNORMAL LOW (ref 6.5–8.1)

## 2017-08-10 LAB — CBC WITH DIFFERENTIAL/PLATELET
BASOS ABS: 0.1 10*3/uL (ref 0–0.1)
BASOS PCT: 1 %
EOS ABS: 0.1 10*3/uL (ref 0–0.7)
EOS PCT: 2 %
HCT: 20.9 % — ABNORMAL LOW (ref 40.0–52.0)
Hemoglobin: 6.7 g/dL — ABNORMAL LOW (ref 13.0–18.0)
LYMPHS PCT: 6 %
Lymphs Abs: 0.5 10*3/uL — ABNORMAL LOW (ref 1.0–3.6)
MCH: 27.9 pg (ref 26.0–34.0)
MCHC: 32.1 g/dL (ref 32.0–36.0)
MCV: 86.7 fL (ref 80.0–100.0)
Monocytes Absolute: 0.8 10*3/uL (ref 0.2–1.0)
Monocytes Relative: 9 %
Neutro Abs: 7.4 10*3/uL — ABNORMAL HIGH (ref 1.4–6.5)
Neutrophils Relative %: 82 %
PLATELETS: 311 10*3/uL (ref 150–440)
RBC: 2.41 MIL/uL — AB (ref 4.40–5.90)
RDW: 18.1 % — AB (ref 11.5–14.5)
WBC: 8.9 10*3/uL (ref 3.8–10.6)

## 2017-08-10 LAB — PREPARE RBC (CROSSMATCH)

## 2017-08-10 MED ORDER — SODIUM CHLORIDE 0.9 % IV SOLN
200.0000 mg | Freq: Once | INTRAVENOUS | Status: AC
  Administered 2017-08-10: 200 mg via INTRAVENOUS
  Filled 2017-08-10: qty 8

## 2017-08-10 MED ORDER — SODIUM CHLORIDE 0.9 % IV SOLN
Freq: Once | INTRAVENOUS | Status: AC
  Administered 2017-08-10: 12:00:00 via INTRAVENOUS
  Filled 2017-08-10: qty 1000

## 2017-08-10 MED ORDER — MORPHINE SULFATE ER 30 MG PO TBCR: 30.0000 mg | EXTENDED_RELEASE_TABLET | Freq: Two times a day (BID) | ORAL | 0 refills | Status: DC

## 2017-08-10 MED ORDER — SODIUM CHLORIDE 0.9 % IV SOLN
250.0000 mL | Freq: Once | INTRAVENOUS | Status: AC
  Administered 2017-08-10: 250 mL via INTRAVENOUS
  Filled 2017-08-10: qty 250

## 2017-08-10 MED ORDER — HEPARIN SOD (PORK) LOCK FLUSH 100 UNIT/ML IV SOLN
500.0000 [IU] | Freq: Once | INTRAVENOUS | Status: AC
  Administered 2017-08-10: 500 [IU] via INTRAVENOUS
  Filled 2017-08-10: qty 5

## 2017-08-10 MED ORDER — ACETAMINOPHEN 325 MG PO TABS
650.0000 mg | ORAL_TABLET | Freq: Once | ORAL | Status: AC
  Administered 2017-08-10: 650 mg via ORAL
  Filled 2017-08-10: qty 2

## 2017-08-10 MED ORDER — SODIUM CHLORIDE 0.9% FLUSH
10.0000 mL | INTRAVENOUS | Status: DC | PRN
  Administered 2017-08-10: 10 mL via INTRAVENOUS
  Filled 2017-08-10: qty 10

## 2017-08-10 NOTE — Addendum Note (Signed)
Addended by: Luella Cook on: 08/10/2017 04:25 PM   Modules accepted: Orders

## 2017-08-10 NOTE — Progress Notes (Signed)
Patient here for follow up today with labs and treatment with [facility].

## 2017-08-10 NOTE — Progress Notes (Signed)
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   Chief complaint/ Reason for visit- on treatment assessment prior to cycle 2 of keytruda   Heme/Onc history:patient is a 71 year old Caucasian male who presented to the ER in June 2018with symptoms of worsening abdominal pain. He has been havving dark tarry stools for about 3 weeks prior to that. He is also had a significant weight loss of about 10 poundsover the last few months. CT abdomen done in the ER showed multiple liver metastases as well as a mass in the gastric cardia projecting into the lumen as well as adenopathy in the gastrohepatic ligament  EGD showed: Large fungating mass with bleeding at the GE junction 48 cm from the incisors. Mass was partially obstructing and partially circumferential involving one half of the lumen. Large fungating infiltrative and ulcerated and partially circumferential mass involving one half of the lumen circumference bleeding and stigmata of recent bleeding also found in the gastric cardia  Stomach mass cardia biopsy showed poorly differentiated adenocarcinoma. Her 2 neu negativeand PDL1 was expressed  CT thorax showed 7 mm lingular nodule. No evidence of bone mets on bone scan. L2 lucent lesion likely hemangioma  1st line palliative FOLFOX started on 03/27/17. PDL1 expression Positive  Ct scans after 5 cycles of FOLFOX revealed marked enlargement of gastric mass and progression of liver mets. Also showed new RML lung nodule ? Mets  Given patients borderline performance status to tolerate chemotherapy, plan was to proceed with 2nd line immunotherapy with [place] given  that he was PDL1 positive. He did receive palliative RT to his stomach for 10 days   Interval history- Patient feels his long acting pain medication is not relieving his pain optimally. Leg edema persists. He also continues to have dark melaontic stools  ECOG PS- 1 Pain scale- 3 Opioid associated constipation- no  Review of systems- Review of Systems  Constitutional: Positive for malaise/fatigue. Negative for chills, fever and weight loss.  HENT: Negative for congestion, ear discharge and nosebleeds.   Eyes: Negative for blurred vision.  Respiratory: Negative for cough, hemoptysis, sputum production, shortness of breath and wheezing.   Cardiovascular: Negative for chest pain, palpitations, orthopnea and claudication.  Gastrointestinal: Positive for abdominal pain and melena. Negative for blood in stool, constipation, diarrhea, heartburn, nausea and vomiting.  Genitourinary: Negative for dysuria, flank pain, frequency, hematuria and urgency.  Musculoskeletal: Negative for back pain, joint pain and myalgias.  Skin: Negative for rash.  Neurological: Negative for dizziness, tingling, focal weakness, seizures, weakness and headaches.  Endo/Heme/Allergies: Does not bruise/bleed easily.  Psychiatric/Behavioral: Negative for depression and suicidal ideas. The patient does not have insomnia.       No Known Allergies   Past Medical History:  Diagnosis Date  . Cancer ([place])   . COPD (chronic obstructive pulmonary disease) ([place])      Past Surgical History:  Procedure Laterality Date  . KNEE ARTHROSCOPY  1991  . TONSILLECTOMY      Social History   Socioeconomic History  . Marital status: Married    Spouse name: Not on file  . Number of children: Not on file  . Years of education: Not on file  . Highest education  level: Not on file  Social Needs  . Financial resource strain: Not on file  . Food insecurity - worry: Not on file  . Food insecurity - inability: Not on file  .  Transportation needs - medical: Not on file  . Transportation needs - non-medical: Not on file  Occupational History  . Not on file  Tobacco Use  . Smoking status: Former Smoker    Last attempt to quit: 06/2010    Years since quitting: 7.1  . Smokeless tobacco: Never Used  Substance and Sexual Activity  . Alcohol use: No  . Drug use: No  . Sexual activity: Not on file  Other Topics Concern  . Not on file  Social History Narrative  . Not on file    History reviewed. No pertinent family history.   Current Outpatient Medications:  .  morphine (MSIR) 30 MG tablet, Take 1 tablet (30 mg total) by mouth every 4 (four) hours as needed for severe pain., Disp: 180 tablet, Rfl: 0 .  pantoprazole (PROTONIX) 40 MG tablet, Take 1 tablet (40 mg total) by mouth 2 (two) times daily., Disp: 60 tablet, Rfl: 2 .  promethazine (PHENERGAN) 12.5 MG tablet, TAKE 1 TABLET BY MOUTH FOUR TIMES DAILY, Disp: 30 tablet, Rfl: 0 .  furosemide (LASIX) 20 MG tablet, Take 1 tablet (20 mg total) by mouth daily as needed. (Patient not taking: Reported on 07/03/2017), Disp: 30 tablet, Rfl: 0 .  megestrol (MEGACE) 40 MG tablet, Take 1 tablet (40 mg total) by mouth 2 (two) times daily. (Patient not taking: Reported on 06/26/2017), Disp: 60 tablet, Rfl: 3 .  morphine (MS CONTIN) 30 MG 12 hr tablet, Take 1 tablet (30 mg total) every 12 (twelve) hours by mouth., Disp: 60 tablet, Rfl: 0 .  ondansetron (ZOFRAN) 4 MG tablet, Take 1 tablet (4 mg total) by mouth every 6 (six) hours as needed for nausea. (Patient not taking: Reported on 08/10/2017), Disp: 20 tablet, Rfl: 0 .  ondansetron (ZOFRAN) 8 MG tablet, Take 1 tablet (8 mg total) by mouth 2 (two) times daily as needed for refractory nausea / vomiting. Start on day 3 after chemotherapy. (Patient not taking: Reported on 07/20/2017), Disp: 30 tablet, Rfl: 1 .  prochlorperazine (COMPAZINE) 10 MG tablet, Take 1 tablet (10 mg total) by mouth every 6 (six) hours as needed (Nausea or  vomiting). (Patient not taking: Reported on 07/20/2017), Disp: 30 tablet, Rfl: 1 No current facility-administered medications for this visit.   Facility-Administered Medications Ordered in Other Visits:  .  0.9 %  sodium chloride infusion, 250 mL, Intravenous, Once, Sindy Guadeloupe, MD .  acetaminophen (TYLENOL) tablet 650 mg, 650 mg, Oral, Once, Sindy Guadeloupe, MD .  heparin lock flush 100 unit/mL, 500 Units, Intravenous, Once, Sindy Guadeloupe, MD .  pembrolizumab [facility]) 200 mg in sodium chloride 0.9 % 50 mL chemo infusion, 200 mg, Intravenous, Once, Sindy Guadeloupe, MD .  sodium chloride flush (NS) 0.9 % injection 10 mL, 10 mL, Intravenous, PRN, Sindy Guadeloupe, MD, 10 mL at 08/10/17 1121 .  sodium chloride flush (NS) 0.9 % injection 3 mL, 3 mL, Intracatheter, PRN, Sindy Guadeloupe, MD  Physical exam:  Vitals:   08/10/17 1104  BP: 128/78  Pulse: 91  Resp: 14  Temp: (!) 96 F (35.6 C)  TempSrc: Tympanic  Weight: 117 lb (53.1 kg)   Physical Exam  Constitutional: He is oriented to person, place, and time.  Thin and cachectic. Pale appearing  HENT:  Head: Normocephalic and atraumatic.  Eyes: EOM are normal. Pupils are equal, round, and reactive to light.  Neck: Normal range of motion.  Cardiovascular: Normal rate, regular rhythm and normal heart sounds.  Pulmonary/Chest: Effort normal and breath sounds normal.  Abdominal: Soft. Bowel sounds are normal.  Musculoskeletal: He exhibits edema.  Neurological: He is alert and oriented to person, place, and time.  Skin: Skin is warm and dry.     CMP Latest Ref Rng & Units 08/10/2017  Glucose 65 - 99 mg/dL 130(H)  BUN 6 - 20 mg/dL 15  Creatinine 0.61 - 1.24 mg/dL 0.56(L)  Sodium 135 - 145 mmol/L 132(L)  Potassium 3.5 - 5.1 mmol/L 3.8  Chloride 101 - 111 mmol/L 100(L)  CO2 22 - 32 mmol/L 27  Calcium 8.9 - 10.3 mg/dL 7.8(L)  Total Protein 6.5 - 8.1 g/dL 4.4(L)  Total Bilirubin 0.3 - 1.2 mg/dL 0.3  Alkaline Phos 38 - 126 U/L 109  AST  15 - 41 U/L 25  ALT 17 - 63 U/L 10(L)   CBC Latest Ref Rng & Units 08/10/2017  WBC 3.8 - 10.6 K/uL 8.9  Hemoglobin 13.0 - 18.0 g/dL 6.7(L)  Hematocrit 40.0 - 52.0 % 20.9(L)  Platelets 150 - 440 K/uL 311      Assessment and plan- Patient is a 71 y.o. male  with Stage IV GE junction adenocarcinoma with liver metastases and progression on FOLFOX here for on treatment assessment prior to cycle 2 of keytruda  1. Acute on chronic blood loss ae=nemia- patient is significantly anemic again today and hb is 6.7 due to bleeding from his GE junction and gastric cardia tumor. Will transfuse 2 units of PRBC today. Patient has gone through 10 days of RT and that has not helped with bleeding. I will refer him to GI to see if there would be a role for EGD and interventions to stop bleeding from their side.  2. I will proceed with [place] today as his anemia is not due to [place] and this is only treatment at this time to control his tumor. He is likely not a candidate for 3rd line treatment at this time given his poor performance status  3. Neoplasm related pain- increase MS contin to 30 mg Bid. Continue prn morphine  RTC in 3 weeks with cbc, cmp for cycle 3 of keytruda. He will come for repeat cbc with diff on 08/21/17. If hb <7- he will get transfusion. If >7, I will give him feraheme    Visit Diagnosis 1. Symptomatic anemia   2. Esophageal cancer, stage IV ([place])   3. Liver metastases ([place])   4. Encounter for antineoplastic immunotherapy   5. Neoplasm related pain      Dr. Randa Evens, MD, MPH [facility] Pager- [phone] 08/10/2017 12:18 PM

## 2017-08-10 NOTE — Progress Notes (Signed)
Hgb: 6.7. MD, Dr. Janese Banks, notified and already aware. Per MD order: Proceed with scheduled Keytruda treatment today. Patient will also receive 2 units pRBCs in addition to [facility] treatment today.

## 2017-08-11 LAB — TYPE AND SCREEN
ABO/RH(D): AB POS
Antibody Screen: NEGATIVE
Unit division: 0
Unit division: 0

## 2017-08-11 LAB — BPAM RBC
BLOOD PRODUCT EXPIRATION DATE: 201811292359
Blood Product Expiration Date: 201811142359
ISSUE DATE / TIME: 201811091456
ISSUE DATE / TIME: 201811091305
UNIT TYPE AND RH: 9500
Unit Type and Rh: 7300

## 2017-08-11 LAB — T3, FREE: T3, Free: 2.3 pg/mL (ref 2.0–4.4)

## 2017-08-11 LAB — T4: T4 TOTAL: 6.9 ug/dL (ref 4.5–12.0)

## 2017-08-15 ENCOUNTER — Encounter: Payer: Self-pay | Admitting: Gastroenterology

## 2017-08-15 ENCOUNTER — Inpatient Hospital Stay: Payer: PPO

## 2017-08-15 ENCOUNTER — Ambulatory Visit (INDEPENDENT_AMBULATORY_CARE_PROVIDER_SITE_OTHER): Payer: PPO | Admitting: Gastroenterology

## 2017-08-15 ENCOUNTER — Encounter (INDEPENDENT_AMBULATORY_CARE_PROVIDER_SITE_OTHER): Payer: Self-pay

## 2017-08-15 VITALS — BP 112/71 | HR 86 | Temp 97.5°F | Ht 70.0 in | Wt 119.8 lb

## 2017-08-15 DIAGNOSIS — Z5112 Encounter for antineoplastic immunotherapy: Secondary | ICD-10-CM | POA: Diagnosis not present

## 2017-08-15 DIAGNOSIS — C799 Secondary malignant neoplasm of unspecified site: Secondary | ICD-10-CM

## 2017-08-15 DIAGNOSIS — C159 Malignant neoplasm of esophagus, unspecified: Secondary | ICD-10-CM

## 2017-08-15 DIAGNOSIS — K921 Melena: Secondary | ICD-10-CM

## 2017-08-15 DIAGNOSIS — C787 Secondary malignant neoplasm of liver and intrahepatic bile duct: Secondary | ICD-10-CM

## 2017-08-15 LAB — CBC WITH DIFFERENTIAL/PLATELET
BASOS ABS: 0.1 10*3/uL (ref 0–0.1)
Basophils Relative: 1 %
Eosinophils Absolute: 0.2 10*3/uL (ref 0–0.7)
Eosinophils Relative: 3 %
HEMATOCRIT: 28.4 % — AB (ref 40.0–52.0)
HEMOGLOBIN: 9.3 g/dL — AB (ref 13.0–18.0)
LYMPHS PCT: 11 %
Lymphs Abs: 0.8 10*3/uL — ABNORMAL LOW (ref 1.0–3.6)
MCH: 28.5 pg (ref 26.0–34.0)
MCHC: 32.9 g/dL (ref 32.0–36.0)
MCV: 86.7 fL (ref 80.0–100.0)
MONO ABS: 0.9 10*3/uL (ref 0.2–1.0)
MONOS PCT: 12 %
NEUTROS ABS: 5.6 10*3/uL (ref 1.4–6.5)
Neutrophils Relative %: 73 %
Platelets: 219 10*3/uL (ref 150–440)
RBC: 3.27 MIL/uL — ABNORMAL LOW (ref 4.40–5.90)
RDW: 17.3 % — AB (ref 11.5–14.5)
WBC: 7.7 10*3/uL (ref 3.8–10.6)

## 2017-08-15 NOTE — Addendum Note (Signed)
Addended by: Leontine Locket Z on: 08/15/2017 11:13 AM   Modules accepted: Orders, SmartSet

## 2017-08-15 NOTE — Progress Notes (Signed)
Larry Wood, MRCP(U.K) [address]  [place]  [place], [place] [address]  Main: [phone]  Fax: [phone]   Gastroenterology Consultation  Referring Provider:     Sindy Guadeloupe, Wood Primary Care Physician:  Derinda Late, Wood Primary Gastroenterologist:  Dr. Jonathon Bellows  Reason for Consultation:     GI bleed         HPI:   Larry Wood is a 71 y.o. y/o male referred for GI bleed.  Summary of history :  The patient is a 71 year old gentleman who is been referred by the oncology team Dr. 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  Prior upper endoscopy on 03/10/2017 demonstrated a Large fungating mass at the GE junction that was partially obstructing ,circumferential involving half the lumen, the mass was seen fungating and infiltrating past the GE junction on retroflexion oozing with  stigmata of recent bleeding.  CT scan of the abdomen shows multiple lesions in the liver suggestive of metastasis.  He is undergoing palliative chemotherapy, subsequent CT scan has shown progression of the mass as well as a liver metastasis .  He has been referred to see me today to determine if he would be a candidate for the Hemo spray with the aim to try and halt the chronic bleed from this mass so far he has been receiving blood transfusions.   CBC Latest Ref Rng & Units 08/10/2017 07/20/2017 07/12/2017  WBC 3.8 - 10.6 K/uL 8.9 14.1(H) 10.8(H)  Hemoglobin 13.0 - 18.0 g/dL 6.7(L) 8.5(L) 6.0(L)  Hematocrit 40.0 - 52.0 % 20.9(L) 26.2(L) 18.5(L)  Platelets 150 - 440 K/uL 311 331 446(H)    He says he continues to have black stools, no dysphagia, not on any blood thinners.    Past Medical History:  Diagnosis Date  . Cancer ([place])   . COPD (chronic obstructive pulmonary disease) ([place])     Past Surgical History:  Procedure Laterality Date  . KNEE ARTHROSCOPY  1991  . TONSILLECTOMY      Prior to Admission medications   Medication Sig Start Date End Date  Taking? Authorizing Provider  dexamethasone (DECADRON) 4 MG tablet Take by mouth.    Provider, Historical, Wood  furosemide (LASIX) 20 MG tablet Take 1 tablet (20 mg total) by mouth daily as needed. Patient not taking: Reported on 07/03/2017 05/04/17   Sindy Guadeloupe, Wood  HYDROcodone-acetaminophen (NORCO/VICODIN) 5-325 MG tablet Take by mouth.    Provider, Historical, Wood  LORazepam (ATIVAN) 0.5 MG tablet Take by mouth.    Provider, Historical, Wood  megestrol (MEGACE) 40 MG tablet Take 1 tablet (40 mg total) by mouth 2 (two) times daily. Patient not taking: Reported on 06/26/2017 06/12/17   Sindy Guadeloupe, Wood  morphine (MS CONTIN) 30 MG 12 hr tablet Take 1 tablet (30 mg total) every 12 (twelve) hours by mouth. 08/10/17   Sindy Guadeloupe, Wood  morphine (MSIR) 30 MG tablet Take 1 tablet (30 mg total) by mouth every 4 (four) hours as needed for severe pain. 07/03/17   Sindy Guadeloupe, Wood  ondansetron (ZOFRAN) 4 MG tablet Take 1 tablet (4 mg total) by mouth every 6 (six) hours as needed for nausea. Patient not taking: Reported on 08/10/2017 03/11/17   Loletha Grayer, Wood  ondansetron (ZOFRAN) 8 MG tablet Take 1 tablet (8 mg total) by mouth 2 (two) times daily as needed for refractory nausea / vomiting. Start on day 3 after chemotherapy. Patient not taking: Reported  on 07/20/2017 04/09/17   Sindy Guadeloupe, Wood  pantoprazole (PROTONIX) 40 MG tablet Take 1 tablet (40 mg total) by mouth 2 (two) times daily. 07/12/17 07/12/18  Sindy Guadeloupe, Wood  prochlorperazine (COMPAZINE) 10 MG tablet Take 1 tablet (10 mg total) by mouth every 6 (six) hours as needed (Nausea or vomiting). Patient not taking: Reported on 07/20/2017 04/09/17   Sindy Guadeloupe, Wood  promethazine (PHENERGAN) 12.5 MG tablet TAKE 1 TABLET BY MOUTH FOUR TIMES DAILY 08/06/17   Sindy Guadeloupe, Wood    No family history on file.   Social History   Tobacco Use  . Smoking status: Former Smoker    Last attempt to quit: 06/2010    Years since quitting: 7.2  .  Smokeless tobacco: Never Used  Substance Use Topics  . Alcohol use: No  . Drug use: No    Allergies as of 08/15/2017  . (No Known Allergies)    Review of Systems:    All systems reviewed and negative except where noted in HPI.   Physical Exam:  There were no vitals taken for this visit. No LMP for male patient. Psych:  Alert and cooperative. Normal mood and affect. General:   Very thin and cachectic Head:  Normocephalic and atraumatic. Eyes:  Sclera clear, no icterus.   Conjunctiva pink. Ears:  Normal auditory acuity. Nose:  No deformity, discharge, or lesions. Mouth:  No deformity or lesions,oropharynx pink & moist. Neck:  Supple; no masses or thyromegaly. Lungs:  Respirations even and unlabored.  Clear throughout to auscultation.   No wheezes, crackles, or rhonchi. No acute distress. Heart:  Regular rate and rhythm; no murmurs, clicks, rubs, or gallops. Abdomen:  Normal bowel sounds.  No bruits.  Soft, non-tender and non-distended without masses, hepatosplenomegaly or hernias noted.  No guarding or rebound tenderness.    Neurologic:  Alert and oriented x3;  grossly normal neurologically. Skin:  Intact without significant lesions or rashes. No jaundice. Lymph Nodes:  No significant cervical adenopathy. Psych:  Alert and cooperative. Normal mood and affect.  Imaging Studies: No results found.  Assessment and Plan:   Larry Wood is a 71 y.o. y/o male with a history of adenocarcinoma of the GE junction stage IV with liver metastasis on palliative chemotherapy.  He has been referred for management of recurrent bleeding from the cancer and for the possibility of hemostasis with the new product that we have called hemospray.    Plan 1.  EGD with HemoSpray: I have explained to the patient that this is a new therapy approved for hemostasis from chronic bleeding gastric malignancies.  2. CBC   I have discussed alternative options, risks & benefits,  which include, but are  not limited to, bleeding, infection, perforation,respiratory complication & drug reaction.  The patient agrees with this plan & written consent will be obtained.    Follow up iPRN  Dr Larry Wood,MRCP(U.K)

## 2017-08-16 ENCOUNTER — Other Ambulatory Visit: Payer: Self-pay | Admitting: *Deleted

## 2017-08-17 ENCOUNTER — Ambulatory Visit
Admission: RE | Admit: 2017-08-17 | Discharge: 2017-08-17 | Disposition: A | Discharge status: Home / Self Care | Payer: PPO | Source: Ambulatory Visit | Attending: Gastroenterology | Admitting: Gastroenterology

## 2017-08-17 ENCOUNTER — Other Ambulatory Visit: Payer: Self-pay

## 2017-08-17 ENCOUNTER — Encounter
Admission: RE | Disposition: A | Discharge status: Home / Self Care | Payer: Self-pay | Source: Ambulatory Visit | Attending: Gastroenterology

## 2017-08-17 ENCOUNTER — Ambulatory Visit: Payer: PPO | Admitting: Registered Nurse

## 2017-08-17 ENCOUNTER — Encounter: Payer: Self-pay | Admitting: *Deleted

## 2017-08-17 DIAGNOSIS — Z87891 Personal history of nicotine dependence: Secondary | ICD-10-CM | POA: Diagnosis not present

## 2017-08-17 DIAGNOSIS — K319 Disease of stomach and duodenum, unspecified: Secondary | ICD-10-CM

## 2017-08-17 DIAGNOSIS — J449 Chronic obstructive pulmonary disease, unspecified: Secondary | ICD-10-CM | POA: Insufficient documentation

## 2017-08-17 DIAGNOSIS — D490 Neoplasm of unspecified behavior of digestive system: Secondary | ICD-10-CM | POA: Diagnosis not present

## 2017-08-17 DIAGNOSIS — Z79891 Long term (current) use of opiate analgesic: Secondary | ICD-10-CM | POA: Diagnosis not present

## 2017-08-17 DIAGNOSIS — Z79899 Other long term (current) drug therapy: Secondary | ICD-10-CM | POA: Insufficient documentation

## 2017-08-17 DIAGNOSIS — K922 Gastrointestinal hemorrhage, unspecified: Secondary | ICD-10-CM | POA: Diagnosis not present

## 2017-08-17 DIAGNOSIS — C160 Malignant neoplasm of cardia: Secondary | ICD-10-CM | POA: Diagnosis not present

## 2017-08-17 DIAGNOSIS — K921 Melena: Secondary | ICD-10-CM | POA: Diagnosis not present

## 2017-08-17 HISTORY — PX: ESOPHAGOGASTRODUODENOSCOPY (EGD) WITH PROPOFOL: SHX5813

## 2017-08-17 SURGERY — ESOPHAGOGASTRODUODENOSCOPY (EGD) WITH PROPOFOL
Anesthesia: General

## 2017-08-17 MED ORDER — SODIUM CHLORIDE 0.9 % IV SOLN
INTRAVENOUS | Status: DC
  Administered 2017-08-17: 10:00:00 via INTRAVENOUS

## 2017-08-17 MED ORDER — FENTANYL CITRATE (PF) 100 MCG/2ML IJ SOLN
INTRAMUSCULAR | Status: AC
  Filled 2017-08-17: qty 2

## 2017-08-17 MED ORDER — MIDAZOLAM HCL 2 MG/2ML IJ SOLN
INTRAMUSCULAR | Status: AC
  Filled 2017-08-17: qty 2

## 2017-08-17 MED ORDER — PROPOFOL 10 MG/ML IV BOLUS
INTRAVENOUS | Status: DC | PRN
  Administered 2017-08-17: 50 mg via INTRAVENOUS

## 2017-08-17 MED ORDER — MIDAZOLAM HCL 2 MG/2ML IJ SOLN
INTRAMUSCULAR | Status: DC | PRN
  Administered 2017-08-17: .5 mg via INTRAVENOUS

## 2017-08-17 MED ORDER — PHENYLEPHRINE HCL 10 MG/ML IJ SOLN
INTRAMUSCULAR | Status: DC | PRN
  Administered 2017-08-17: 100 ug via INTRAVENOUS
  Administered 2017-08-17: 50 ug via INTRAVENOUS

## 2017-08-17 MED ORDER — GLYCOPYRROLATE 0.2 MG/ML IJ SOLN
INTRAMUSCULAR | Status: DC | PRN
  Administered 2017-08-17: 0.2 mg via INTRAVENOUS

## 2017-08-17 MED ORDER — PROPOFOL 500 MG/50ML IV EMUL
INTRAVENOUS | Status: DC | PRN
  Administered 2017-08-17: 140 ug/kg/min via INTRAVENOUS

## 2017-08-17 MED ORDER — LIDOCAINE HCL (CARDIAC) 20 MG/ML IV SOLN
INTRAVENOUS | Status: DC | PRN
  Administered 2017-08-17: 40 mg via INTRAVENOUS

## 2017-08-17 MED ORDER — FENTANYL CITRATE (PF) 100 MCG/2ML IJ SOLN
INTRAMUSCULAR | Status: DC | PRN
  Administered 2017-08-17: 25 ug via INTRAVENOUS

## 2017-08-17 MED ORDER — GLYCOPYRROLATE 0.2 MG/ML IJ SOLN
INTRAMUSCULAR | Status: AC
Start: 2017-08-17 — End: 2017-08-17
  Filled 2017-08-17: qty 1

## 2017-08-17 NOTE — Transfer of Care (Signed)
Immediate Anesthesia Transfer of Care Note  Patient: Larry Wood  Procedure(s) Performed: ESOPHAGOGASTRODUODENOSCOPY (EGD) WITH PROPOFOL WITH HEMOSPRAY (N/A )  Patient Location: PACU  Anesthesia Type:General  Level of Consciousness: awake, alert  and oriented  Airway & Oxygen Therapy: Patient Spontanous Breathing and Patient connected to nasal cannula oxygen  Post-op Assessment: Report given to RN and Post -op Vital signs reviewed and stable  Post vital signs: Reviewed and stable  Last Vitals:  Vitals:   08/17/17 1009 08/17/17 1012  BP: (!) 82/44 (!) 139/59  Pulse: 71 71  Resp: 10 12  Temp: (!) 36.1 C (!) 36.1 C  SpO2: 100% 100%    Last Pain:  Vitals:   08/17/17 1009  TempSrc: Tympanic  PainSc:          Complications: No apparent anesthesia complications

## 2017-08-17 NOTE — Op Note (Signed)
[facility] Gastroenterology Patient Name: Larry Wood Procedure Date: 08/17/2017 9:37 AM MRN: [id number] Account #: [id number] Date of Birth: 09-04-1946 Admit Type: Outpatient Age: 71 Room: [facility] ENDO ROOM 4 Gender: Male Note Status: Finalized Procedure:            Upper GI endoscopy Indications:          Occult blood in stool Providers:            Jonathon Bellows MD, MD Referring MD:         Caprice Renshaw MD (Referring MD) Medicines:            Monitored Anesthesia Care Complications:        No immediate complications. Procedure:            Pre-Anesthesia Assessment:                       - Prior to the procedure, a History and Physical was                        performed, and patient medications, allergies and                        sensitivities were reviewed. The patient's tolerance of                        previous anesthesia was reviewed.                       - The risks and benefits of the procedure and the                        sedation options and risks were discussed with the                        patient. All questions were answered and informed                        consent was obtained.                       - After reviewing the risks and benefits, the patient                        was deemed in satisfactory condition to undergo the                        procedure.                       - ASA Grade Assessment: III - A patient with severe                        systemic disease.                       After obtaining informed consent, the endoscope was                        passed under direct vision. Throughout the procedure,  the patient's blood pressure, pulse, and oxygen                        saturations were monitored continuously. The                        Colonoscope was introduced through the mouth, and                        advanced to the third part of duodenum. The upper GI   endoscopy was accomplished with ease. The patient                        tolerated the procedure well. Findings:      The esophagus was normal.      A large, fungating, partially circumferential (involving two-thirds of       the lumen circumference) mass with oozing bleeding and stigmata of       recent bleeding was found in the cardia. There was some food in the       stomach , using a roth net I was able to drop the food into the duodenum       . On retroflexion the large mass was seen in the cardia. There was blood       dripping and stigmata of bleed from the mass. Using the COOK Hemospray       the agent was sprayed all over the mass with good hemostasis. No       bleeding seen at the end of the procedure.      The examined duodenum was normal. Impression:           - Normal esophagus.                       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                       - Normal examined duodenum.                       - No specimens collected. Recommendation:       - Discharge patient to home (with escort).                       - - Advance diet as tolerated after 4 hours [Duration].                       - Return to my office PRN. Procedure Code(s):    --- Professional ---                       [phone], Esophagogastroduodenoscopy, flexible, transoral;                        diagnostic, including collection of specimen(s) by                        brushing or washing, when performed (separate procedure) Diagnosis Code(s):    --- Professional ---                       C16.0, Malignant neoplasm of cardia  R19.5, Other fecal abnormalities CPT copyright 2016 American Medical Association. All rights reserved. The codes documented in this report are preliminary and upon coder review may  be revised to meet current compliance requirements. Jonathon Bellows, MD Jonathon Bellows MD, MD 08/17/2017 10:07:25 AM This report has been signed electronically. Number of Addenda: 0 Note  Initiated On: 08/17/2017 9:37 AM      [facility]

## 2017-08-17 NOTE — Anesthesia Preprocedure Evaluation (Signed)
Anesthesia Evaluation  Patient identified by MRN, date of birth, ID band Patient awake    Reviewed: Allergy & Precautions, H&P , NPO status , Patient's Chart, lab work & pertinent test results  History of Anesthesia Complications Negative for: history of anesthetic complications  Airway Mallampati: III  TM Distance: >3 FB Neck ROM: limited    Dental  (+) Chipped, Missing, Edentulous Lower, Edentulous Upper   Pulmonary neg shortness of breath, COPD, former smoker,           Cardiovascular Exercise Tolerance: Good (-) angina(-) Past MI and (-) DOE negative cardio ROS       Neuro/Psych negative neurological ROS  negative psych ROS   GI/Hepatic negative GI ROS, Neg liver ROS, neg GERD  ,  Endo/Other  negative endocrine ROS  Renal/GU negative Renal ROS  negative genitourinary   Musculoskeletal   Abdominal   Peds  Hematology negative hematology ROS (+)   Anesthesia Other Findings Past Medical History: No date: Cancer ([place]) No date: COPD (chronic obstructive pulmonary disease) ([place])  Past Surgical History: 03/10/2017: ESOPHAGOGASTRODUODENOSCOPY (EGD) WITH PROPOFOL; N/A     Comment:  Procedure: ESOPHAGOGASTRODUODENOSCOPY (EGD) WITH               PROPOFOL;  Surgeon: Wilford Corner, MD;  Location:               [facility] ENDOSCOPY;  Service: Endoscopy;  Laterality: N/A; 1991: KNEE ARTHROSCOPY 03/21/2017: PORTA CATH INSERTION; N/A     Comment:  Procedure: Porta Cath Insertion;  Surgeon: Algernon Huxley,              MD;  Location: [facility];  Service:               Cardiovascular;  Laterality: N/A; No date: TONSILLECTOMY     Reproductive/Obstetrics negative OB ROS                             Anesthesia Physical Anesthesia Plan  ASA: III  Anesthesia Plan: General   Post-op Pain Management:    Induction: Intravenous  PONV Risk Score and Plan: Propofol infusion  Airway  Management Planned: Natural Airway and Nasal Cannula  Additional Equipment:   Intra-op Plan:   Post-operative Plan:   Informed Consent: I have reviewed the patients History and Physical, chart, labs and discussed the procedure including the risks, benefits and alternatives for the proposed anesthesia with the patient or authorized representative who has indicated his/her understanding and acceptance.   Dental Advisory Given  Plan Discussed with: Anesthesiologist, CRNA and Surgeon  Anesthesia Plan Comments: (Patient consented for risks of anesthesia including but not limited to:  - adverse reactions to medications - risk of intubation if required - damage to teeth, lips or other oral mucosa - sore throat or hoarseness - Damage to heart, brain, lungs or loss of life  Patient voiced understanding.)        Anesthesia Quick Evaluation

## 2017-08-17 NOTE — Anesthesia Postprocedure Evaluation (Signed)
Anesthesia Post Note  Patient: Larry Wood  Procedure(s) Performed: ESOPHAGOGASTRODUODENOSCOPY (EGD) WITH PROPOFOL WITH HEMOSPRAY (N/A )  Patient location during evaluation: Endoscopy Anesthesia Type: General Level of consciousness: awake and alert Pain management: pain level controlled Vital Signs Assessment: post-procedure vital signs reviewed and stable Respiratory status: spontaneous breathing, nonlabored ventilation, respiratory function stable and patient connected to nasal cannula oxygen Cardiovascular status: blood pressure returned to baseline and stable Postop Assessment: no apparent nausea or vomiting Anesthetic complications: no     Last Vitals:  Vitals:   08/17/17 1019 08/17/17 1029  BP: (!) 100/43 (!) 111/49  Pulse: 67   Resp: 10 (!) 9  Temp:    SpO2: 100% 100%    Last Pain:  Vitals:   08/17/17 1009  TempSrc: Tympanic  PainSc:                  [name]

## 2017-08-17 NOTE — H&P (Signed)
Jonathon Bellows, MD [address], [place], [place], [place], [address] [address], [place], [place], [place], [address] Phone: [phone]  Fax: [phone]  Primary Care Physician:  Derinda Late, MD   Pre-Procedure History & Physical: HPI:  Larry Wood is a 71 y.o. male is here for an endoscopy    Past Medical History:  Diagnosis Date  . Cancer ([place])   . COPD (chronic obstructive pulmonary disease) ([place])     Past Surgical History:  Procedure Laterality Date  . ESOPHAGOGASTRODUODENOSCOPY (EGD) WITH PROPOFOL N/A 03/10/2017   Performed by Wilford Corner, MD at [facility]  . KNEE ARTHROSCOPY  1991  . Porta Cath Insertion N/A 03/21/2017   Performed by Algernon Huxley, MD at [facility]  . TONSILLECTOMY      Prior to Admission medications   Medication Sig Start Date End Date Taking? Authorizing Provider  morphine (MS CONTIN) 30 MG 12 hr tablet Take 1 tablet (30 mg total) every 12 (twelve) hours by mouth. 08/10/17  Yes Sindy Guadeloupe, MD  pantoprazole (PROTONIX) 40 MG tablet Take 1 tablet (40 mg total) by mouth 2 (two) times daily. 07/12/17 07/12/18 Yes Sindy Guadeloupe, MD  promethazine (PHENERGAN) 12.5 MG tablet TAKE 1 TABLET BY MOUTH FOUR TIMES DAILY 08/06/17  Yes Sindy Guadeloupe, MD  dexamethasone (DECADRON) 4 MG tablet Take by mouth.    [provider]  furosemide (LASIX) 20 MG tablet Take 1 tablet (20 mg total) by mouth daily as needed. Patient not taking: Reported on 07/03/2017 05/04/17   Sindy Guadeloupe, MD  HYDROcodone-acetaminophen (NORCO/VICODIN) 5-325 MG tablet Take by mouth.    [provider]  LORazepam (ATIVAN) 0.5 MG tablet Take by mouth.    [provider]  megestrol (MEGACE) 40 MG tablet Take 1 tablet (40 mg total) by mouth 2 (two) times daily. Patient not taking: Reported on 06/26/2017 06/12/17   Sindy Guadeloupe, MD  prochlorperazine (COMPAZINE) 10 MG tablet Take 1 tablet (10 mg total) by mouth every 6 (six) hours as  needed (Nausea or vomiting). Patient not taking: Reported on 07/20/2017 04/09/17   Sindy Guadeloupe, MD    Allergies as of 08/15/2017  . (No Known Allergies)    History reviewed. No pertinent family history.  Social History   Socioeconomic History  . Marital status: Married    Spouse name: Not on file  . Number of children: Not on file  . Years of education: Not on file  . Highest education level: Not on file  Social Needs  . Financial resource strain: Not on file  . Food insecurity - worry: Not on file  . Food insecurity - inability: Not on file  . Transportation needs - medical: Not on file  . Transportation needs - non-medical: Not on file  Occupational History  . Not on file  Tobacco Use  . Smoking status: Former Smoker    Last attempt to quit: 06/2010    Years since quitting: 7.2  . Smokeless tobacco: Never Used  Substance and Sexual Activity  . Alcohol use: No  . Drug use: No  . Sexual activity: Not Currently  Other Topics Concern  . Not on file  Social History Narrative  . Not on file    Review of Systems: See HPI, otherwise negative ROS  Physical Exam: BP (!) 82/44   Pulse 71   Temp (!) 97 F (36.1 C) (Tympanic)   Resp 10   SpO2  100%  General:   Alert,  pleasant and cooperative in NAD Head:  Normocephalic and atraumatic. Neck:  Supple; no masses or thyromegaly. Lungs:  Clear throughout to auscultation, normal respiratory effort.    Heart:  +S1, +S2, Regular rate and rhythm, No edema. Abdomen:  Soft, nontender and nondistended. Normal bowel sounds, without guarding, and without rebound.   Neurologic:  Alert and  oriented x4;  grossly normal neurologically.  Impression/Plan: Larry Wood is here for an endoscopy  to be performed for  evaluation of gastric bleeding     Risks, benefits, limitations, and alternatives regarding endoscopy have been reviewed with the patient.  Questions have been answered.  All parties agreeable.   Jonathon Bellows, MD   08/17/2017, 10:12 AM

## 2017-08-17 NOTE — Anesthesia Post-op Follow-up Note (Signed)
Anesthesia QCDR form completed.        

## 2017-08-20 ENCOUNTER — Encounter: Payer: Self-pay | Admitting: Gastroenterology

## 2017-08-21 ENCOUNTER — Emergency Department: Payer: PPO

## 2017-08-21 ENCOUNTER — Inpatient Hospital Stay: Payer: PPO

## 2017-08-21 ENCOUNTER — Other Ambulatory Visit: Payer: Self-pay

## 2017-08-21 ENCOUNTER — Inpatient Hospital Stay
Admission: EM | Admit: 2017-08-21 | Discharge: 2017-08-23 | DRG: 535 | Disposition: A | Discharge status: Skilled Nursing Facility | Payer: PPO | Attending: Internal Medicine | Admitting: Internal Medicine

## 2017-08-21 DIAGNOSIS — Z87891 Personal history of nicotine dependence: Secondary | ICD-10-CM

## 2017-08-21 DIAGNOSIS — W1830XA Fall on same level, unspecified, initial encounter: Secondary | ICD-10-CM | POA: Diagnosis present

## 2017-08-21 DIAGNOSIS — E871 Hypo-osmolality and hyponatremia: Secondary | ICD-10-CM

## 2017-08-21 DIAGNOSIS — S3210XA Unspecified fracture of sacrum, initial encounter for closed fracture: Secondary | ICD-10-CM

## 2017-08-21 DIAGNOSIS — S32412A Displaced fracture of anterior wall of left acetabulum, initial encounter for closed fracture: Secondary | ICD-10-CM | POA: Diagnosis not present

## 2017-08-21 DIAGNOSIS — C799 Secondary malignant neoplasm of unspecified site: Secondary | ICD-10-CM | POA: Diagnosis not present

## 2017-08-21 DIAGNOSIS — D649 Anemia, unspecified: Secondary | ICD-10-CM | POA: Diagnosis not present

## 2017-08-21 DIAGNOSIS — Z66 Do not resuscitate: Secondary | ICD-10-CM | POA: Diagnosis present

## 2017-08-21 DIAGNOSIS — R55 Syncope and collapse: Secondary | ICD-10-CM | POA: Diagnosis not present

## 2017-08-21 DIAGNOSIS — S300XXA Contusion of lower back and pelvis, initial encounter: Secondary | ICD-10-CM | POA: Diagnosis not present

## 2017-08-21 DIAGNOSIS — D509 Iron deficiency anemia, unspecified: Secondary | ICD-10-CM | POA: Diagnosis not present

## 2017-08-21 DIAGNOSIS — C160 Malignant neoplasm of cardia: Secondary | ICD-10-CM | POA: Diagnosis not present

## 2017-08-21 DIAGNOSIS — S32810A Multiple fractures of pelvis with stable disruption of pelvic ring, initial encounter for closed fracture: Secondary | ICD-10-CM | POA: Diagnosis present

## 2017-08-21 DIAGNOSIS — Z681 Body mass index (BMI) 19 or less, adult: Secondary | ICD-10-CM | POA: Diagnosis not present

## 2017-08-21 DIAGNOSIS — J449 Chronic obstructive pulmonary disease, unspecified: Secondary | ICD-10-CM | POA: Diagnosis not present

## 2017-08-21 DIAGNOSIS — C787 Secondary malignant neoplasm of liver and intrahepatic bile duct: Secondary | ICD-10-CM | POA: Diagnosis present

## 2017-08-21 DIAGNOSIS — Z9221 Personal history of antineoplastic chemotherapy: Secondary | ICD-10-CM

## 2017-08-21 DIAGNOSIS — S32811A Multiple fractures of pelvis with unstable disruption of pelvic ring, initial encounter for closed fracture: Secondary | ICD-10-CM | POA: Diagnosis not present

## 2017-08-21 DIAGNOSIS — S3219XA Other fracture of sacrum, initial encounter for closed fracture: Secondary | ICD-10-CM | POA: Diagnosis not present

## 2017-08-21 DIAGNOSIS — S32591A Other specified fracture of right pubis, initial encounter for closed fracture: Secondary | ICD-10-CM

## 2017-08-21 DIAGNOSIS — M8580 Other specified disorders of bone density and structure, unspecified site: Secondary | ICD-10-CM | POA: Diagnosis not present

## 2017-08-21 DIAGNOSIS — Z8719 Personal history of other diseases of the digestive system: Secondary | ICD-10-CM | POA: Diagnosis not present

## 2017-08-21 DIAGNOSIS — Y92010 Kitchen of single-family (private) house as the place of occurrence of the external cause: Secondary | ICD-10-CM

## 2017-08-21 DIAGNOSIS — E43 Unspecified severe protein-calorie malnutrition: Secondary | ICD-10-CM | POA: Diagnosis present

## 2017-08-21 DIAGNOSIS — M545 Low back pain: Secondary | ICD-10-CM | POA: Diagnosis not present

## 2017-08-21 DIAGNOSIS — F4322 Adjustment disorder with anxiety: Secondary | ICD-10-CM | POA: Diagnosis not present

## 2017-08-21 DIAGNOSIS — S79912A Unspecified injury of left hip, initial encounter: Secondary | ICD-10-CM | POA: Diagnosis not present

## 2017-08-21 DIAGNOSIS — S32422A Displaced fracture of posterior wall of left acetabulum, initial encounter for closed fracture: Secondary | ICD-10-CM

## 2017-08-21 DIAGNOSIS — S3992XA Unspecified injury of lower back, initial encounter: Secondary | ICD-10-CM | POA: Diagnosis not present

## 2017-08-21 DIAGNOSIS — S329XXA Fracture of unspecified parts of lumbosacral spine and pelvis, initial encounter for closed fracture: Secondary | ICD-10-CM | POA: Diagnosis not present

## 2017-08-21 DIAGNOSIS — S0990XA Unspecified injury of head, initial encounter: Secondary | ICD-10-CM | POA: Diagnosis not present

## 2017-08-21 DIAGNOSIS — S79911A Unspecified injury of right hip, initial encounter: Secondary | ICD-10-CM | POA: Diagnosis not present

## 2017-08-21 DIAGNOSIS — S32511A Fracture of superior rim of right pubis, initial encounter for closed fracture: Secondary | ICD-10-CM

## 2017-08-21 DIAGNOSIS — C159 Malignant neoplasm of esophagus, unspecified: Secondary | ICD-10-CM

## 2017-08-21 LAB — CBC WITH DIFFERENTIAL/PLATELET
Basophils Absolute: 0.1 10*3/uL (ref 0–0.1)
Basophils Relative: 1 %
EOS PCT: 0 %
Eosinophils Absolute: 0 10*3/uL (ref 0–0.7)
HEMATOCRIT: 28.4 % — AB (ref 40.0–52.0)
HEMOGLOBIN: 9.3 g/dL — AB (ref 13.0–18.0)
LYMPHS ABS: 0.6 10*3/uL — AB (ref 1.0–3.6)
LYMPHS PCT: 6 %
MCH: 28.4 pg (ref 26.0–34.0)
MCHC: 32.9 g/dL (ref 32.0–36.0)
MCV: 86.4 fL (ref 80.0–100.0)
Monocytes Absolute: 0.6 10*3/uL (ref 0.2–1.0)
Monocytes Relative: 7 %
NEUTROS ABS: 7.8 10*3/uL — AB (ref 1.4–6.5)
Neutrophils Relative %: 86 %
Platelets: 262 10*3/uL (ref 150–440)
RBC: 3.29 MIL/uL — AB (ref 4.40–5.90)
RDW: 18.2 % — AB (ref 11.5–14.5)
WBC: 9 10*3/uL (ref 3.8–10.6)

## 2017-08-21 LAB — BASIC METABOLIC PANEL
ANION GAP: 6 (ref 5–15)
BUN: 13 mg/dL (ref 6–20)
CHLORIDE: 98 mmol/L — AB (ref 101–111)
CO2: 26 mmol/L (ref 22–32)
Calcium: 8 mg/dL — ABNORMAL LOW (ref 8.9–10.3)
Creatinine, Ser: 0.57 mg/dL — ABNORMAL LOW (ref 0.61–1.24)
GFR calc Af Amer: >60 mL/min (ref >60)
GFR calc non Af Amer: >60 mL/min (ref >60)
Glucose, Bld: 95 mg/dL (ref 65–99)
POTASSIUM: 4.1 mmol/L (ref 3.5–5.1)
SODIUM: 130 mmol/L — AB (ref 135–145)

## 2017-08-21 LAB — TROPONIN I: Troponin I: <0.03 ng/mL (ref <0.03)

## 2017-08-21 MED ORDER — ACETAMINOPHEN 650 MG RE SUPP: 650.0000 mg | Freq: Four times a day (QID) | RECTAL | Status: DC | PRN

## 2017-08-21 MED ORDER — MORPHINE SULFATE ER 30 MG PO TBCR
30.0000 mg | EXTENDED_RELEASE_TABLET | Freq: Two times a day (BID) | ORAL | Status: DC
  Administered 2017-08-21 – 2017-08-23 (×4): 30 mg via ORAL
  Filled 2017-08-21 (×4): qty 1

## 2017-08-21 MED ORDER — ONDANSETRON HCL 4 MG/2ML IJ SOLN: 4.0000 mg | Freq: Four times a day (QID) | INTRAMUSCULAR | Status: DC | PRN

## 2017-08-21 MED ORDER — ACETAMINOPHEN 325 MG PO TABS: 650.0000 mg | ORAL_TABLET | Freq: Four times a day (QID) | ORAL | Status: DC | PRN

## 2017-08-21 MED ORDER — MORPHINE SULFATE (PF) 2 MG/ML IV SOLN
2.0000 mg | INTRAVENOUS | Status: DC | PRN
  Administered 2017-08-22 – 2017-08-23 (×3): 2 mg via INTRAVENOUS
  Filled 2017-08-21 (×3): qty 1

## 2017-08-21 MED ORDER — POLYETHYLENE GLYCOL 3350 17 G PO PACK: 17.0000 g | PACK | Freq: Every day | ORAL | Status: DC | PRN

## 2017-08-21 MED ORDER — PANTOPRAZOLE SODIUM 40 MG PO TBEC
40.0000 mg | DELAYED_RELEASE_TABLET | Freq: Two times a day (BID) | ORAL | Status: DC
  Administered 2017-08-21 – 2017-08-23 (×4): 40 mg via ORAL
  Filled 2017-08-21 (×4): qty 1

## 2017-08-21 MED ORDER — ONDANSETRON HCL 4 MG/2ML IJ SOLN
4.0000 mg | Freq: Once | INTRAMUSCULAR | Status: AC
  Administered 2017-08-21: 4 mg via INTRAVENOUS
  Filled 2017-08-21: qty 2

## 2017-08-21 MED ORDER — MORPHINE SULFATE (PF) 4 MG/ML IV SOLN
INTRAVENOUS | Status: AC
  Filled 2017-08-21: qty 1

## 2017-08-21 MED ORDER — PROMETHAZINE HCL 25 MG PO TABS
12.5000 mg | ORAL_TABLET | Freq: Four times a day (QID) | ORAL | Status: DC | PRN
  Filled 2017-08-21: qty 1

## 2017-08-21 MED ORDER — SODIUM CHLORIDE 0.9 % IV SOLN
INTRAVENOUS | Status: DC
  Administered 2017-08-21 – 2017-08-23 (×4): via INTRAVENOUS

## 2017-08-21 MED ORDER — TETANUS-DIPHTH-ACELL PERTUSSIS 5-2.5-18.5 LF-MCG/0.5 IM SUSP
0.5000 mL | Freq: Once | INTRAMUSCULAR | Status: AC
  Administered 2017-08-21: 0.5 mL via INTRAMUSCULAR
  Filled 2017-08-21: qty 0.5

## 2017-08-21 MED ORDER — MORPHINE SULFATE (PF) 4 MG/ML IV SOLN
4.0000 mg | Freq: Once | INTRAVENOUS | Status: AC
  Administered 2017-08-21: 4 mg via INTRAVENOUS

## 2017-08-21 MED ORDER — MEGESTROL ACETATE 20 MG PO TABS
40.0000 mg | ORAL_TABLET | Freq: Two times a day (BID) | ORAL | Status: DC
  Administered 2017-08-21 – 2017-08-22 (×2): 40 mg via ORAL
  Filled 2017-08-21: qty 1
  Filled 2017-08-21: qty 2

## 2017-08-21 MED ORDER — ONDANSETRON HCL 4 MG PO TABS: 4.0000 mg | ORAL_TABLET | Freq: Four times a day (QID) | ORAL | Status: DC | PRN

## 2017-08-21 MED ORDER — SODIUM CHLORIDE 0.9% FLUSH
3.0000 mL | Freq: Two times a day (BID) | INTRAVENOUS | Status: DC
  Administered 2017-08-23: 3 mL via INTRAVENOUS

## 2017-08-21 MED ORDER — FENTANYL CITRATE (PF) 100 MCG/2ML IJ SOLN
12.5000 ug | Freq: Once | INTRAMUSCULAR | Status: AC
  Administered 2017-08-21: 12.5 ug via INTRAVENOUS
  Filled 2017-08-21: qty 2

## 2017-08-21 NOTE — ED Provider Notes (Signed)
-----------------------------------------   6:22 PM on 08/21/2017 -----------------------------------------  Patient care assumed from Dr. Reita Cliche.  I discussed with the social worker, social worker states the [employer] requires preauthorization and it will take at least 24-36 hours for that to occur.  Given the patient has for pelvic fractures and is not able to ambulate due to pain we will admit to the hospitalist service for pain control and placement.   Harvest Dark, MD 08/21/17 Vernelle Emerald

## 2017-08-21 NOTE — NC FL2 (Signed)
[place] LEVEL OF CARE SCREENING TOOL     IDENTIFICATION  Patient Name: Larry Wood Birthdate: 18-Aug-1946 Sex: male Admission Date (Current Location): 08/21/2017  [place] and [place] Number:  Engineering geologist and Address:  [facility], [address] [place], [place] [address]      Provider Number: [id number]  Attending Physician Name and Address:  Gorden Harms, MD  Relative Name and Phone Number:  Spouse Issa Kosmicki [phone]    Current Level of Care: Hospital Recommended Level of Care: [place] Prior Approval Number:    Date Approved/Denied:   PASRR Number: [id number]  Discharge Plan: SNF    Current Diagnoses: Patient Active Problem List   Diagnosis Date Noted  . Cachexia ([place]) 05/15/2017  . Encounter for antineoplastic chemotherapy 04/12/2017  . Neoplasm related pain 04/12/2017  . Iron deficiency anemia due to chronic blood loss 03/27/2017  . Liver metastases ([place]) 03/27/2017  . Goals of care, counseling/discussion 03/27/2017  . 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 Esophageal cancer, stage IV ([place]) 03/19/2017  . Epigastric abdominal tenderness 03/10/2017  . Gastric mass 03/10/2017  . Hyponatremia 03/09/2017    Orientation RESPIRATION BLADDER Height & Weight     Self, Time, Situation, Place  Normal Continent Weight: 117 lb (53.1 kg) Height:  5\' 10"  (177.8 cm)  BEHAVIORAL SYMPTOMS/MOOD NEUROLOGICAL BOWEL NUTRITION STATUS      Continent Diet  AMBULATORY STATUS COMMUNICATION OF NEEDS Skin   Extensive Assist Verbally Normal                       Personal Care Assistance Level of Assistance  Bathing, Feeding, Dressing Bathing Assistance: Limited assistance Feeding assistance: Independent Dressing Assistance: Limited assistance     Functional Limitations Info  Sight, Hearing, Speech Sight Info: Adequate Hearing Info: Adequate Speech Info: Adequate     SPECIAL CARE FACTORS FREQUENCY  PT (By licensed PT)     PT Frequency: 5x              Contractures Contractures Info: Not present    Additional Factors Info  Code Status, Allergies Code Status Info: Full Allergies Info: No Known Allergies           Current Medications (08/21/2017):  This is the current hospital active medication list No current facility-administered medications for this encounter.    Current Outpatient Medications  Medication Sig Dispense Refill  . morphine (MS CONTIN) 30 MG 12 hr tablet Take 1 tablet (30 mg total) every 12 (twelve) hours by mouth. 60 tablet 0  . pantoprazole (PROTONIX) 40 MG tablet Take 1 tablet (40 mg total) by mouth 2 (two) times daily. 60 tablet 2  . promethazine (PHENERGAN) 12.5 MG tablet TAKE 1 TABLET BY MOUTH FOUR TIMES DAILY (Patient taking differently: TAKE 1 TABLET BY MOUTH FOUR TIMES DAILY AS NEEDED) 30 tablet 0  . furosemide (LASIX) 20 MG tablet Take 1 tablet (20 mg total) by mouth daily as needed. (Patient not taking: Reported on 07/03/2017) 30 tablet 0  . megestrol (MEGACE) 40 MG tablet Take 1 tablet (40 mg total) by mouth 2 (two) times daily. (Patient not taking: Reported on 06/26/2017) 60 tablet 3  . prochlorperazine (COMPAZINE) 10 MG tablet Take 1 tablet (10 mg total) by mouth every 6 (six) hours as needed (Nausea or vomiting). (Patient not taking: Reported on 07/20/2017) 30 tablet 1   Facility-Administered Medications Ordered in Other Encounters  Medication Dose Route Frequency Provider  Last Rate Last Dose  . sodium chloride flush (NS) 0.9 % injection 3 mL  3 mL Intracatheter PRN Sindy Guadeloupe, MD         Discharge Medications: Please see discharge summary for a list of discharge medications.  Relevant Imaging Results:  Relevant Lab Results:   Additional Information SSN: [id number]  Truitt Merle, LCSW

## 2017-08-21 NOTE — ED Notes (Signed)
Pt alert.  Pt waiting on admission.   

## 2017-08-21 NOTE — Discharge Instructions (Signed)
Your evaluated after a fall, no serious injury is suspected, but I suspect he may be sore in your back and your hips over the next several days.  Your medical evaluation is overall reassuring, although we held off a urine sample evaluation today.  Return to emergency department immediately for any new or worsening uncontrolled pain any confusion or altered mental status, fever, any urinary symptoms such as frequency or burning, or abdominal pain, any chest pain or palpitations, or any other symptoms concerning to you.

## 2017-08-21 NOTE — H&P (Signed)
[facility] NAME: Larry Wood    MR#:  [id number]  DATE OF BIRTH:  1946/07/06  DATE OF ADMISSION:  08/21/2017  PRIMARY CARE PHYSICIAN: Derinda Late, MD   REQUESTING/REFERRING PHYSICIAN:   CHIEF COMPLAINT:   Chief Complaint  Patient presents with  . Fall    HISTORY OF PRESENT ILLNESS: Larry Wood  is a 71 y.o. male with a known history of stage IV esophageal cancer on immunotherapy every 3 weeks, chronic upper GI bleeding, COPD, iron deficiency anemia, presenting from home status post fall after acute syncopal episode/loss of consciousness, in the emergency room found to have multiple closed pelvic fractures, EKG noted for right bundle branch block sinus tachycardia with heart rate of 102, sodium 130, chloride 98, hemoglobin stable at 9.3, L-spine x-rays negative for acute fracture, patient evaluated in the emergency room, no apparent distress, patient is now being admitted with acute syncopal episode with associated collapse resulting in multiple pelvic fractures and chronic stage IV esophageal cancer.  PAST MEDICAL HISTORY:   Past Medical History:  Diagnosis Date  . Cancer ([place])   . COPD (chronic obstructive pulmonary disease) ([place])     PAST SURGICAL HISTORY:  Past Surgical History:  Procedure Laterality Date  . ESOPHAGOGASTRODUODENOSCOPY (EGD) WITH PROPOFOL N/A 03/10/2017   Procedure: ESOPHAGOGASTRODUODENOSCOPY (EGD) WITH PROPOFOL;  Surgeon: Wilford Corner, MD;  Location: [facility] ENDOSCOPY;  Service: Endoscopy;  Laterality: N/A;  . ESOPHAGOGASTRODUODENOSCOPY (EGD) WITH PROPOFOL N/A 08/17/2017   Procedure: ESOPHAGOGASTRODUODENOSCOPY (EGD) WITH PROPOFOL WITH HEMOSPRAY;  Surgeon: Jonathon Bellows, MD;  Location: [facility] ENDOSCOPY;  Service: Gastroenterology;  Laterality: N/A;  . KNEE ARTHROSCOPY  1991  . PORTA CATH INSERTION N/A 03/21/2017   Procedure: Glori Luis Cath Insertion;  Surgeon: Algernon Huxley, MD;  Location: [facility];  Service: Cardiovascular;  Laterality: N/A;  . TONSILLECTOMY      SOCIAL HISTORY:  Social History   Tobacco Use  . Smoking status: Former Smoker    Last attempt to quit: 06/2010    Years since quitting: 7.2  . Smokeless tobacco: Never Used  Substance Use Topics  . Alcohol use: No    FAMILY HISTORY: No family history on file.  DRUG ALLERGIES: No Known Allergies  REVIEW OF SYSTEMS:   CONSTITUTIONAL: No fever, +fatigue/weakness.  EYES: No blurred or double vision.  EARS, NOSE, AND THROAT: No tinnitus or ear pain.  RESPIRATORY: No cough, shortness of breath, wheezing or hemoptysis.  CARDIOVASCULAR: No chest pain, orthopnea, edema.  GASTROINTESTINAL: No nausea, vomiting, diarrhea or abdominal pain.  GENITOURINARY: No dysuria, hematuria.  ENDOCRINE: No polyuria, nocturia,  HEMATOLOGY: No anemia, easy bruising or bleeding SKIN: No rash or lesion. MUSCULOSKELETAL: Pelvic pain NEUROLOGIC: No tingling, numbness, weakness.  PSYCHIATRY: No anxiety or depression.   MEDICATIONS AT HOME:  Prior to Admission medications   Medication Sig Start Date End Date Taking? Authorizing Provider  morphine (MS CONTIN) 30 MG 12 hr tablet Take 1 tablet (30 mg total) every 12 (twelve) hours by mouth. 08/10/17  Yes Sindy Guadeloupe, MD  pantoprazole (PROTONIX) 40 MG tablet Take 1 tablet (40 mg total) by mouth 2 (two) times daily. 07/12/17 07/12/18 Yes Sindy Guadeloupe, MD  promethazine (PHENERGAN) 12.5 MG tablet TAKE 1 TABLET BY MOUTH FOUR TIMES DAILY Patient taking differently: TAKE 1 TABLET BY MOUTH FOUR TIMES DAILY AS NEEDED 08/06/17  Yes Sindy Guadeloupe, MD  furosemide (LASIX) 20 MG tablet Take 1 tablet (20 mg total) by mouth daily  as needed. Patient not taking: Reported on 07/03/2017 05/04/17   Sindy Guadeloupe, MD  megestrol (MEGACE) 40 MG tablet Take 1 tablet (40 mg total) by mouth 2 (two) times daily. Patient not taking: Reported on 06/26/2017 06/12/17   Sindy Guadeloupe, MD  prochlorperazine (COMPAZINE)  10 MG tablet Take 1 tablet (10 mg total) by mouth every 6 (six) hours as needed (Nausea or vomiting). Patient not taking: Reported on 07/20/2017 04/09/17   Sindy Guadeloupe, MD      PHYSICAL EXAMINATION:   VITAL SIGNS: Blood pressure 119/69, pulse 89, temperature 97.8 F (36.6 C), temperature source Oral, resp. rate 19, height 5\' 10"  (1.778 m), weight 53.1 kg (117 lb), SpO2 97 %.  GENERAL:  71 y.o.-year-old patient lying in the bed with no acute distress.  Cachectic appearance EYES: Pupils equal, round, reactive to light and accommodation. No scleral icterus. Extraocular muscles intact.  HEENT: Head atraumatic, normocephalic. Oropharynx and nasopharynx clear.  NECK:  Supple, no jugular venous distention. No thyroid enlargement, no tenderness.  LUNGS: Normal breath sounds bilaterally, no wheezing, rales,rhonchi or crepitation. No use of accessory muscles of respiration.  CARDIOVASCULAR: S1, S2 normal. No murmurs, rubs, or gallops.  ABDOMEN: Soft, nontender, nondistended. Bowel sounds present. No organomegaly or mass.  EXTREMITIES: No pedal edema, cyanosis, or clubbing.  NEUROLOGIC: Cranial nerves II through XII are intact. MAES. Gait not checked.  PSYCHIATRIC: The patient is alert and oriented x 3.  SKIN: No obvious rash, lesion, or ulcer.   LABORATORY PANEL:   CBC Recent Labs  Lab 08/15/17 1135 08/21/17 1125  WBC 7.7 9.0  HGB 9.3* 9.3*  HCT 28.4* 28.4*  PLT 219 262  MCV 86.7 86.4  MCH 28.5 28.4  MCHC 32.9 32.9  RDW 17.3* 18.2*  LYMPHSABS 0.8* 0.6*  MONOABS 0.9 0.6  EOSABS 0.2 0.0  BASOSABS 0.1 0.1   ------------------------------------------------------------------------------------------------------------------  Chemistries  Recent Labs  Lab 08/21/17 1125  NA 130*  K 4.1  CL 98*  CO2 26  GLUCOSE 95  BUN 13  CREATININE 0.57*  CALCIUM 8.0*   ------------------------------------------------------------------------------------------------------------------ estimated  creatinine clearance is 65.5 mL/min (A) (by C-G formula based on SCr of 0.57 mg/dL (L)). ------------------------------------------------------------------------------------------------------------------ No results for input(s): TSH, T4TOTAL, T3FREE, THYROIDAB in the last 72 hours.  Invalid input(s): FREET3   Coagulation profile No results for input(s): INR, PROTIME in the last 168 hours. ------------------------------------------------------------------------------------------------------------------- No results for input(s): DDIMER in the last 72 hours. -------------------------------------------------------------------------------------------------------------------  Cardiac Enzymes Recent Labs  Lab 08/21/17 1125  TROPONINI <0.03   ------------------------------------------------------------------------------------------------------------------ Invalid input(s): POCBNP  ---------------------------------------------------------------------------------------------------------------  Urinalysis    Component Value Date/Time   COLORURINE YELLOW (A) 06/26/2017 1555   APPEARANCEUR HAZY (A) 06/26/2017 1555   LABSPEC 1.025 06/26/2017 1555   PHURINE 5.0 06/26/2017 1555   GLUCOSEU NEGATIVE 06/26/2017 1555   HGBUR NEGATIVE 06/26/2017 1555   BILIRUBINUR NEGATIVE 06/26/2017 1555   KETONESUR NEGATIVE 06/26/2017 1555   PROTEINUR NEGATIVE 06/26/2017 1555   NITRITE NEGATIVE 06/26/2017 1555   LEUKOCYTESUR NEGATIVE 06/26/2017 1555     RADIOLOGY: Dg Lumbar Spine 2-3 Views  Result Date: 08/21/2017 CLINICAL DATA:  Pain following fall EXAM: LUMBAR SPINE - 2-3 VIEW COMPARISON:  None. FINDINGS: Frontal, lateral, and spot lumbosacral lateral images were obtained. There are 5 non-rib-bearing lumbar type vertebral bodies. There is no demonstrable fracture or spondylolisthesis. There is moderately severe disc space narrowing at L5-S1. Other disc spaces appear unremarkable. No erosive change. There  is aortoiliac atherosclerosis. IMPRESSION: Moderately severe disc space narrowing at  L5-S1. Other disc spaces appear unremarkable. No acute fracture or spondylolisthesis. There is aortoiliac atherosclerosis. Aortic Atherosclerosis (ICD10-I70.0). Electronically Signed   By: Lowella Grip III M.D.   On: 08/21/2017 11:40   Ct Head Wo Contrast  Result Date: 08/21/2017 CLINICAL DATA:  Unwitnessed fall. EXAM: CT HEAD WITHOUT CONTRAST TECHNIQUE: Contiguous axial images were obtained from the base of the skull through the vertex without intravenous contrast. COMPARISON:  None. FINDINGS: Brain: No evidence of acute infarction, hemorrhage, hydrocephalus, extra-axial collection or mass lesion/mass effect. Mild to moderate age related cerebral atrophy. Vascular: Atherosclerotic vascular calcification of the carotid siphons. No hyperdense vessel. Skull: Normal. Negative for fracture or focal lesion. Sinuses/Orbits: Minimal partial opacification of the left mastoid air cells. Otherwise, no acute finding. Other: None. IMPRESSION: No acute intracranial abnormality. Electronically Signed   By: Titus Dubin M.D.   On: 08/21/2017 12:07   Ct Pelvis Wo Contrast  Result Date: 08/21/2017 CLINICAL DATA:  Patient found on floor without loss of consciousness. Right groin pain with movement. EXAM: CT PELVIS WITHOUT CONTRAST TECHNIQUE: Multidetector CT imaging of the pelvis was performed following the standard protocol without intravenous contrast. COMPARISON:  06/29/2017 CT FINDINGS: Urinary Tract: No hydroureteronephrosis. Physiologically distended urinary bladder without calculus or mass. No focal mural thickening. Bowel: No bowel obstruction. Colonic diverticulosis with circular muscle hypertrophy along the sigmoid colon. Vascular/Lymphatic: Moderate to marked atherosclerosis of the included distal abdominal aorta, common iliacs and branch vessels. No lymphadenopathy identified. Reproductive:  Normal size prostate. Other:  Hypodense masses of the included right hepatic lobe consistent with known metastatic disease. Moderate ascites and diffuse soft tissue anasarca. Musculoskeletal: Acute left sacral alar fracture without intra-articular extension, series 2, image 40 through 45. Acute right superior and inferior pubic rami fractures with minimal displacement of the superior pubic ramus fracture near the pubic symphysis. Acute, nondisplaced anterior wall fracture of the left acetabulum. Osteoarthritic joint space narrowing of both hips. No joint dislocation. The bones are demineralized in appearance and there are more focal areas of indistinct osteopenia involving the bony pelvis in particular the right iliac bone, series 6, image 22 an about both acetabular roofs. These may represent subtle lytic lesions. The included lower lumbar spine demonstrates degenerative disc and facet arthropathy. IMPRESSION: 1. Acute right superior and inferior pubic rami, left sacral alar and left anterior acetabular wall fractures are noted, minimally displaced involving the right superior pubic ramus fracture, otherwise without significant displacement nor joint dislocations. No proximal femoral fracture. 2. Osteopenic appearance of the bony pelvis with more focal areas of osteopenia in particular the right iliac bone. Early changes of lytic metastasis is not excluded. 3. Re- demonstration of hepatic metastasis of the included right hepatic lobe. 4. Diffuse soft tissue anasarca and ascites. 5. Colonic diverticulosis without acute diverticulitis. 6. Aortoiliac and branch vessel atherosclerosis. Electronically Signed   By: Ashley Royalty M.D.   On: 08/21/2017 14:39   Dg Hip Unilat W Or Wo Pelvis 2-3 Views Left  Result Date: 08/21/2017 CLINICAL DATA:  Fall.  Groin pain. EXAM: DG HIP (WITH OR WITHOUT PELVIS) 2-3V LEFT COMPARISON:  CT 06/29/2017. FINDINGS: Diffuse osteopenia degenerative change. No acute bony or joint abnormality identified. No evidence of  fracture dislocation. Aortoiliac and peripheral vascular calcification. IMPRESSION: 1. Diffuse osteopenia degenerative change. 2.  Aortoiliac and peripheral vascular disease. Electronically Signed   By: Marcello Moores  Register   On: 08/21/2017 11:40   Dg Hip Unilat W Or Wo Pelvis 2-3 Views Right  Result Date: 08/21/2017 CLINICAL DATA:  Fall.  Groin pain.  Back pain. EXAM: DG HIP (WITH OR WITHOUT PELVIS) 2-3V RIGHT COMPARISON:  CT 06/29/2017. FINDINGS: Diffuse osteopenia and degenerative change. No acute bony or joint abnormality identified. No evidence fracture or dislocation. Peripheral vascular calcification. IMPRESSION: 1.  Diffuse osteopenia degenerative change.  No acute abnormality. 2. Peripheral vascular disease . Electronically Signed   By: Marcello Moores  Register   On: 08/21/2017 11:39    EKG: Orders placed or performed during the hospital encounter of 08/21/17  . ED EKG  . ED EKG  . ED EKG  . ED EKG    IMPRESSION AND PLAN: 1 acute syncope with collapse Admit to regular nursing floor bed, fall/aspiration precautions, rule out acute coronary syndrome with cardiac enzymes x3 sets, check carotid Dopplers, echocardiogram, and continue close medical monitoring  2 acute closed multiple pelvic fractures Consult orthopedic surgery for expert opinion-Per ED attending, no operative intervention at this time Continue home pain regimen, IV morphine for breakthrough pain  3 chronic stage IV esophageal cancer Currently receiving immunotherapy every 3 weeks Consult oncology for continuity of care  4 history of upper GI bleeding Hemoglobin is stable Avoid antiplatelet/NSAIDs/anticoagulants  5 COPD without exacerbation Breathing treatments as needed  DNR status per patient wishes Condition stable Prognosis dismal DVT prophylaxis with SCDs/TED hose Disposition to home with family versus nursing home in 2-3 days    All the records are reviewed and case discussed with ED provider. Management plans  discussed with the patient, family and they are in agreement.  CODE STATUS: Code Status History    Date Active Date Inactive Code Status Order ID Comments User Context   07/03/2017 11:27 08/17/2017 08:33 DNR [id number]  Sindy Guadeloupe, MD Outpatient   03/09/2017 15:02 03/11/2017 18:41 Full Code [id number]  Nicholes Mango, MD Inpatient    Questions for Most Recent Historical Code Status (Order [id number])    Question Answer Comment   In the event of cardiac or respiratory ARREST Do not call a "code blue"    In the event of cardiac or respiratory ARREST Do not perform Intubation, CPR, defibrillation or ACLS    In the event of cardiac or respiratory ARREST Use medication by any route, position, wound care, and other measures to relive pain and suffering. May use oxygen, suction and manual treatment of airway obstruction as needed for comfort.    Comments discussed with patient on 07/03/17. MOST updated        TOTAL TIME TAKING CARE OF THIS PATIENT: 35 minutes.    Avel Peace Adel Neyer M.D on 08/21/2017   Between 7am to 6pm - Pager - [phone]  After 6pm go to [email or web address] - password [email or web address] [place] [facility]  Office  [phone]  CC: Primary care physician; Derinda Late, MD   Note: This dictation was prepared with Dragon dictation along with smaller phrase technology. Any transcriptional errors that result from this process are unintentional.

## 2017-08-21 NOTE — Evaluation (Signed)
Physical Therapy Evaluation Patient Details Name: Larry Wood MRN: [id number] DOB: 06-Mar-1946 Today's Date: 08/21/2017   History of Present Illness  presented to ER secondary to unwitnessed fall in home environment with acute onset of back, pelvic and bilat hip pain.  Per CT of pelvis, noted with R superior/inferior pubic rami fractures, L sacral ala fracture and L anterior acetabular wall fracture.  Per notes, patient to be WBAT bilat LEs.  Clinical Impression  Upon evaluation, patient alert and oriented; follows commands.  Bilat LEs generally guarded, all planes, due to pain; very limited tolerance for movement or mobility attempts.  Pain rated at 5/10 at rest, 10/10 with movement despite pain medication administered during session.  Currently unable to tolerate any mobility efforts beyond supine activity due to significant pain.  Did attempt rolling (allowing therapist to assist to hook-lying position), but with noted increase in pain with any resistance throughout extremities or activation of core musculature.  Dep for all care at this time as a result.   Will continue mobility assessment/progression as pain controlled in subsequent sessions. Would benefit from skilled PT to address above deficits and promote optimal return to PLOF; recommend transition to STR upon discharge from acute hospitalization.     Follow Up Recommendations SNF    Equipment Recommendations       Recommendations for Other Services       Precautions / Restrictions Precautions Precautions: Fall Restrictions Weight Bearing Restrictions: Yes RLE Weight Bearing: Weight bearing as tolerated LLE Weight Bearing: Weight bearing as tolerated      Mobility  Bed Mobility Overal bed mobility: Needs Assistance Bed Mobility: Rolling Rolling: Total assist         General bed mobility comments: tolerating only very minimal movement due to pain.  With any resistance or initiation of rotation, patient  immediately recoils and aborts effort due to pain.  Transfers                 General transfer comment: unable to tolerate due to pain  Ambulation/Gait             General Gait Details: unable to tolerate due to pain  Stairs            Wheelchair Mobility    Modified Rankin (Stroke Patients Only)       Balance       Sitting balance - Comments: unable to tolerate due to pain       Standing balance comment: unable to tolerate due to pain                             Pertinent Vitals/Pain Pain Assessment: 0-10 Pain Score: 5  Pain Location: 5/10 at rest, 10/10 with any attempts at mobility Pain Descriptors / Indicators: Aching;Grimacing;Guarding Pain Intervention(s): Limited activity within patient's tolerance;Monitored during session;Repositioned;Patient requesting pain meds-RN notified;RN gave pain meds during session    Home Living Family/patient expects to be discharged to:: Private residence Living Arrangements: Spouse/significant other Available Help at Discharge: Family Type of Home: House Home Access: Stairs to enter Entrance Stairs-Rails: Left Entrance Stairs-Number of Steps: 4 Home Layout: One level        Prior Function Level of Independence: Independent         Comments: Indep with ADLs, household and community mobilization without assist device; denies fall history outside of this event     Hand Dominance        Extremity/Trunk Assessment  Upper Extremity Assessment Upper Extremity Assessment: Overall WFL for tasks assessed    Lower Extremity Assessment Lower Extremity Assessment: Generalized weakness(grossly 3-/5 throughout bilat LEs, significant limitation by pain; generally edematous 2+ bilat LEs)       Communication   Communication: No difficulties  Cognition Arousal/Alertness: Awake/alert Behavior During Therapy: WFL for tasks assessed/performed Overall Cognitive Status: Within Functional Limits  for tasks assessed                                        General Comments      Exercises     Assessment/Plan    PT Assessment Patient needs continued PT services  PT Problem List Decreased strength;Decreased range of motion;Decreased activity tolerance;Decreased balance;Decreased mobility;Decreased coordination;Decreased cognition;Decreased knowledge of use of DME;Decreased safety awareness;Decreased knowledge of precautions;Pain       PT Treatment Interventions DME instruction;Gait training;Stair training;Functional mobility training;Therapeutic activities;Therapeutic exercise;Balance training;Patient/family education    PT Goals (Current goals can be found in the Care Plan section)  Acute Rehab PT Goals Patient Stated Goal: to make the pain go away PT Goal Formulation: With patient Time For Goal Achievement: 09/04/17 Potential to Achieve Goals: Fair Additional Goals Additional Goal #1: Assess and establish goals for OOB activities as appropriate.    Frequency 7X/week   Barriers to discharge        Co-evaluation               AM-PAC PT "6 Clicks" Daily Activity  Outcome Measure Difficulty turning over in bed (including adjusting bedclothes, sheets and blankets)?: Unable Difficulty moving from lying on back to sitting on the side of the bed? : Unable Difficulty sitting down on and standing up from a chair with arms (e.g., wheelchair, bedside commode, etc,.)?: Unable Help needed moving to and from a bed to chair (including a wheelchair)?: Total Help needed walking in hospital room?: Total Help needed climbing 3-5 steps with a railing? : Total 6 Click Score: 6    End of Session   Activity Tolerance: Patient limited by pain Patient left: in bed;with call bell/phone within reach;with family/visitor present   PT Visit Diagnosis: Difficulty in walking, not elsewhere classified (R26.2);Muscle weakness (generalized) (M62.81);Pain Pain - Right/Left:  Left Pain - part of body: Hip    Time: 8453-6468 PT Time Calculation (min) (ACUTE ONLY): 21 min   Charges:   PT Evaluation $PT Eval Moderate Complexity: 1 Mod     PT G Codes:   PT G-Codes **NOT FOR INPATIENT CLASS** Functional Assessment Tool Used: AM-PAC 6 Clicks Basic Mobility Functional Limitation: Mobility: Walking and moving around Mobility: Walking and Moving Around Current Status (E3212): 100 percent impaired, limited or restricted Mobility: Walking and Moving Around Goal Status (Y4825): At least 20 percent but less than 40 percent impaired, limited or restricted    Raymonda Pell H. Owens Shark, PT, DPT, NCS 08/21/17, 3:59 PM [phone]

## 2017-08-21 NOTE — ED Notes (Signed)
Amy RN, aware of bed assigned  

## 2017-08-21 NOTE — ED Notes (Signed)
Resumed care from bill rn.  Pt alert.  Family with pt   Pain meds given.  PT in with pt now.

## 2017-08-21 NOTE — ED Notes (Signed)
Pt states feeling better after pain meds

## 2017-08-21 NOTE — ED Notes (Signed)
Pt is a 2 max assist into wheel chair and a 2 max assist back into bed. Pt states pain is 10/10 when moving or when applying any type of weight to the left leg.

## 2017-08-21 NOTE — Clinical Social Work Note (Signed)
CSW received a Social Work consult for SNF Placement. CSW staffed case with Dr. Reita Cliche. Pt from home with spouse. CT revealed pt has pelvic fractures. Pt requiring 2 person max assist to transfer. P/T recommending SNF placement. CSW started [employer] authorization, which per rep Crystal, can take from 24 to 72 hours for a decision. CSW updated Dr. Kerman Passey. FL-2 complete and referral sent to local SNFs. CSW continuing to follow for transition of care/discharge planning.   Larry Wood, Latanya Presser, [place] Social Worker-ED [phone]

## 2017-08-21 NOTE — ED Notes (Signed)
Report off to drew rn floor nurse

## 2017-08-21 NOTE — ED Triage Notes (Addendum)
Pt had unwitnessed fall this AM. Pt states he felt fine upon awakening this AM. Pt does not remember falling this morning at home. Wife in another room, found pt on floor. Denies any witnessed LOC. Pt arrives with left sided eyebrow abrasion. C/o left shoulder pain. Right groin pain with movement. Lower back pain with movement. No abnormalities noted within extremities. Pt was going to cancer center this AM for labwork. PT being treated for stomach cancer currently. Pt alert and oriented X4, active, cooperative, pt in NAD. RR even and unlabored, color WNL.  Pt winces when moved.  Pt took prescribed morphine and protoxin PTA PO. Wife at bedside. CBG WNL and VSS per EMS report. Does not take blood thinners per report.

## 2017-08-21 NOTE — ED Provider Notes (Addendum)
[facility] Emergency Department Provider Note ____________________________________________   I have reviewed the triage vital signs and the triage nursing note.  HISTORY  Chief Complaint Fall   Historian Patient and spouse  HPI Larry Wood is a 71 y.o. male with a history of metastatic esophageal cancer, lives at home with his wife, was getting ready to go to a cancer center follow-up appointment today when he had apparently gone to the kitchen although he does not really remember going to the kitchen next thing he woke up and his wife was there.  She states she heard a bump and found that he had fallen.  Patient had struck the left side of his face.  Patient did not have any preceding symptoms.  He is not sure what may happen.  He is complaining of pelvic and bilateral hip pain and some low back pain.  No neck pain.  No confusion or altered mental status.  Does not know when his last tetanus shot was.  No chest pain or shortness of breath.  He does have known stomach cancer lesions that have been bleeding and a history of blood transfusions.  He does look pale.     Past Medical History:  Diagnosis Date  . Cancer ([place])   . COPD (chronic obstructive pulmonary disease) [facility])     Patient Active Problem List   Diagnosis Date Noted  . Cachexia ([place]) 05/15/2017  . Encounter for antineoplastic chemotherapy 04/12/2017  . Neoplasm related pain 04/12/2017  . Iron deficiency anemia due to chronic blood loss 03/27/2017  . Liver metastases ([place]) 03/27/2017  . Goals of care, counseling/discussion 03/27/2017  . Metastasis from esophageal cancer ([place]) 03/19/2017  . Esophageal cancer, stage IV ([place]) 03/19/2017  . Epigastric abdominal tenderness 03/10/2017  . Gastric mass 03/10/2017  . Hyponatremia 03/09/2017    Past Surgical History:  Procedure Laterality Date  . ESOPHAGOGASTRODUODENOSCOPY (EGD) WITH PROPOFOL N/A 03/10/2017   Procedure:  ESOPHAGOGASTRODUODENOSCOPY (EGD) WITH PROPOFOL;  Surgeon: Wilford Corner, MD;  Location: [facility] ENDOSCOPY;  Service: Endoscopy;  Laterality: N/A;  . ESOPHAGOGASTRODUODENOSCOPY (EGD) WITH PROPOFOL N/A 08/17/2017   Procedure: ESOPHAGOGASTRODUODENOSCOPY (EGD) WITH PROPOFOL WITH HEMOSPRAY;  Surgeon: Jonathon Bellows, MD;  Location: [facility] ENDOSCOPY;  Service: Gastroenterology;  Laterality: N/A;  . KNEE ARTHROSCOPY  1991  . PORTA CATH INSERTION N/A 03/21/2017   Procedure: Glori Luis Cath Insertion;  Surgeon: Algernon Huxley, MD;  Location: [place] CV LAB;  Service: Cardiovascular;  Laterality: N/A;  . TONSILLECTOMY      Prior to Admission medications   Medication Sig Start Date End Date Taking? Authorizing Provider  morphine (MS CONTIN) 30 MG 12 hr tablet Take 1 tablet (30 mg total) every 12 (twelve) hours by mouth. 08/10/17  Yes Sindy Guadeloupe, MD  pantoprazole (PROTONIX) 40 MG tablet Take 1 tablet (40 mg total) by mouth 2 (two) times daily. 07/12/17 07/12/18 Yes Sindy Guadeloupe, MD  promethazine (PHENERGAN) 12.5 MG tablet TAKE 1 TABLET BY MOUTH FOUR TIMES DAILY Patient taking differently: TAKE 1 TABLET BY MOUTH FOUR TIMES DAILY AS NEEDED 08/06/17  Yes Sindy Guadeloupe, MD  furosemide (LASIX) 20 MG tablet Take 1 tablet (20 mg total) by mouth daily as needed. Patient not taking: Reported on 07/03/2017 05/04/17   Sindy Guadeloupe, MD  megestrol (MEGACE) 40 MG tablet Take 1 tablet (40 mg total) by mouth 2 (two) times daily. Patient not taking: Reported on 06/26/2017 06/12/17   Sindy Guadeloupe, MD  prochlorperazine (COMPAZINE) 10 MG tablet  Take 1 tablet (10 mg total) by mouth every 6 (six) hours as needed (Nausea or vomiting). Patient not taking: Reported on 07/20/2017 04/09/17   Sindy Guadeloupe, MD    No Known Allergies  No family history on file.  Social History Social History   Tobacco Use  . Smoking status: Former Smoker    Last attempt to quit: 06/2010    Years since quitting: 7.2  . Smokeless tobacco: Never  Used  Substance Use Topics  . Alcohol use: No  . Drug use: No    Review of Systems  Constitutional: Negative for fever. Eyes: Negative for visual changes. ENT: Negative for sore throat. Cardiovascular: Negative for chest pain. Respiratory: Negative for shortness of breath. Gastrointestinal: Negative for abdominal pain, vomiting and diarrhea. Genitourinary: Negative for dysuria. Musculoskeletal: Positive for low back pain and bilateral hip pain, he did not walk after he fell. Skin: Negative for rash. Neurological: Negative for headache.  ____________________________________________   PHYSICAL EXAM:  VITAL SIGNS: ED Triage Vitals  Enc Vitals Group     BP 08/21/17 0930 132/79     Pulse Rate 08/21/17 0930 91     Resp 08/21/17 0930 14     Temp 08/21/17 0930 97.8 F (36.6 C)     Temp Source 08/21/17 0930 Oral     SpO2 08/21/17 0930 97 %     Weight 08/21/17 0930 117 lb (53.1 kg)     Height 08/21/17 0930 5\' 10"  (1.778 m)     Head Circumference --      Peak Flow --      Pain Score 08/21/17 0929 2     Pain Loc --      Pain Edu? --      Excl. in [place]? --      Constitutional: Alert and oriented. Well appearing and in no distress.  Cachectic HEENT   Head: Normocephalic and atraumatic.      Eyes: Conjunctivae are normal. Pupils equal and round.       Ears:         Nose: No congestion/rhinnorhea.   Mouth/Throat: Mucous membranes are moist.   Neck: No stridor.  No C-spine step-off.  Nontender C-spine to palpation and range of motion. Cardiovascular/Chest: Normal rate, regular rhythm.  No murmurs, rubs, or gallops. Respiratory: Normal respiratory effort without tachypnea nor retractions. Breath sounds are clear and equal bilaterally. No wheezes/rales/rhonchi. Gastrointestinal: Soft. No distention, no guarding, no rebound. Nontender.    Genitourinary/rectal:Deferred Musculoskeletal: Nontender with normal range of motion in all extremities. No joint effusions.  No lower  extremity tenderness.  No edema. Neurologic:  Normal speech and language. No gross or focal neurologic deficits are appreciated. Skin:  Skin is warm, dry and intact. No rash noted. Psychiatric: Mood and affect are normal. Speech and behavior are normal. Patient exhibits appropriate insight and judgment.   ____________________________________________  LABS (pertinent positives/negatives) I, Lisa Roca, MD the attending physician have reviewed the labs noted below.  Labs Reviewed  BASIC METABOLIC PANEL - Abnormal; Notable for the following components:      Result Value   Sodium 130 (*)    Chloride 98 (*)    Creatinine, Ser 0.57 (*)    Calcium 8.0 (*)    All other components within normal limits  CBC WITH DIFFERENTIAL/PLATELET - Abnormal; Notable for the following components:   RBC 3.29 (*)    Hemoglobin 9.3 (*)    HCT 28.4 (*)    RDW 18.2 (*)    Neutro  Abs 7.8 (*)    Lymphs Abs 0.6 (*)    All other components within normal limits  TROPONIN I    ____________________________________________    EKG I, Lisa Roca, MD, the attending physician have personally viewed and interpreted all ECGs.  103 bpm.  Sinus tachycardia.  Nonspecific conduction delay, incomplete right bundle branch block.  Nonspecific ST and T wave. ____________________________________________  RADIOLOGY All Xrays were viewed by me.  Imaging interpreted by Radiologist, and I, Lisa Roca, MD the attending physician have reviewed the radiologist interpretation noted below.  CT head without contrast:  IMPRESSION: No acute intracranial abnormality.   Right hip and left hip x-rays with pelvis:  IMPRESSION: 1. Diffuse osteopenia degenerative change.  2.  Aortoiliac and peripheral vascular disease. IMPRESSION: 1.  Diffuse osteopenia degenerative change.  No acute abnormality.  2. Peripheral vascular disease .  Lumbar spine dg:  IMPRESSION: Moderately severe disc space narrowing at L5-S1. Other disc  spaces appear unremarkable. No acute fracture or spondylolisthesis. There is aortoiliac atherosclerosis.  Aortic Atherosclerosis (ICD10-I70.0).  CT pelvis:  IMPRESSION: 1. Acute right superior and inferior pubic rami, left sacral alar and left anterior acetabular wall fractures are noted, minimally displaced involving the right superior pubic ramus fracture, otherwise without significant displacement nor joint dislocations. No proximal femoral fracture. 2. Osteopenic appearance of the bony pelvis with more focal areas of osteopenia in particular the right iliac bone. Early changes of lytic metastasis is not excluded. 3. Re- demonstration of hepatic metastasis of the included right hepatic lobe. 4. Diffuse soft tissue anasarca and ascites. 5. Colonic diverticulosis without acute diverticulitis. 6. Aortoiliac and branch vessel atherosclerosis. __________________________________________  PROCEDURES  Procedure(s) performed: None  Critical Care performed: None   ____________________________________________  ED COURSE / ASSESSMENT AND PLAN  Pertinent labs & imaging results that were available during my care of the patient were reviewed by me and considered in my medical decision making (see chart for details).   Unclear what caused him to have the unwitnessed fall, from a traumatic standpoint, I will CT his head although is not on blood thinners, he does have ecchymosis to the left scalp/face.  C-spine cleared clinically.  From a trauma standpoint he is also complaining of some hip pain and will image this as well as his low back.  From the standpoint of the syncope versus mechanical fall, no clear high risk features in terms of his preceding or after symptoms, but I am going to check labs as well as EKG and urinalysis.   From a trauma standpoint, imaging is all reassuring.  From a medical evaluation from the unwitnessed fall, patient has chronic hyponatremia and chronic anemia  I discussed these with the patient and his wife.  He has not yet performed a urine sample, and he states he is not having symptoms and does not really want to wait to send that.  I discussed that with he and his wife and they do not feel like they want to stay to provide a urine sample.  He thinks he probably just got tripped up.  I discussed with him that I do not have a high suspicion for cardiac or neurologic cause for the unwitnessed fall, but we certainly discussed return precautions.  He does have close follow-up with his oncologist.    CONSULTATIONS:  None  Patient / Family / Caregiver informed of clinical course, medical decision-making process, and agree with plan.  I discussed return precautions, follow-up instructions, and discharge instructions with patient and/or family.  Discharge Instructions : Your evaluated after a fall, no serious injury is suspected, but I suspect he may be sore in your back and your hips over the next several days.  Your medical evaluation is overall reassuring, although we held off a urine sample evaluation today.  Return to emergency department immediately for any new or worsening uncontrolled pain any confusion or altered mental status, fever, any urinary symptoms such as frequency or burning, or abdominal pain, any chest pain or palpitations, or any other symptoms concerning to you.   Addended because upon getting patient to wheelchair, he was almost unable to bear weight.  We placed him back in his bed and plan for CT to evaluate for occult fracture.  CT does show multiple occult fractures of the pelvis.  Discussed with orthopedics, weightbearing as tolerated.  Patient is likely going to need acute care rehab however because he is really unable to stand.  Given IV fentanyl for pain.    I discussed with Dr. Sabra Heck, orthopedics, nonoperative fractures, weightbearing as tolerated.  If patient is in the hospital, would be happy to consult.  We will try  to have PT and social work evaluate the patient for prior possible direct acute care rehab placement from the ED.  If there are any troubles getting that completed, patient may be admitted observation to the hospital overnight.  Patient care to be transferred to Dr. Kerman Passey at shift change 3 PM.  dispo per PT/SW recommendations. ___________________________________________   FINAL CLINICAL IMPRESSION(S) / ED DIAGNOSES   Final diagnoses:  Lumbar contusion, initial encounter  Minor head injury, initial encounter  Anemia, unspecified type  Chronic hyponatremia  Closed fracture of right superior pubic ramus, initial encounter ([place])  Closed fracture of right inferior pubic ramus, initial encounter ([place])  Closed fracture of sacrum, unspecified fracture morphology, initial encounter ([place])  Closed posterior wall acetabular fx, left, initial encounter [facility])      ___________________________________________  ED Discharge Orders    None            Note: This dictation was prepared with Dragon dictation. Any transcriptional errors that result from this process are unintentional    Lisa Roca, MD 08/21/17 1304    Lisa Roca, MD 08/21/17 [phone]

## 2017-08-22 ENCOUNTER — Ambulatory Visit: Payer: PPO | Admitting: Gastroenterology

## 2017-08-22 ENCOUNTER — Inpatient Hospital Stay
Admit: 2017-08-22 | Discharge: 2017-08-22 | Disposition: A | Discharge status: Home / Self Care | Payer: PPO | Attending: Family Medicine | Admitting: Family Medicine

## 2017-08-22 ENCOUNTER — Inpatient Hospital Stay: Payer: PPO

## 2017-08-22 DIAGNOSIS — R609 Edema, unspecified: Secondary | ICD-10-CM

## 2017-08-22 DIAGNOSIS — S329XXA Fracture of unspecified parts of lumbosacral spine and pelvis, initial encounter for closed fracture: Secondary | ICD-10-CM | POA: Diagnosis not present

## 2017-08-22 DIAGNOSIS — C160 Malignant neoplasm of cardia: Secondary | ICD-10-CM

## 2017-08-22 DIAGNOSIS — W1830XA Fall on same level, unspecified, initial encounter: Secondary | ICD-10-CM

## 2017-08-22 DIAGNOSIS — D500 Iron deficiency anemia secondary to blood loss (chronic): Secondary | ICD-10-CM

## 2017-08-22 DIAGNOSIS — Y92010 Kitchen of single-family (private) house as the place of occurrence of the external cause: Secondary | ICD-10-CM | POA: Diagnosis not present

## 2017-08-22 DIAGNOSIS — K922 Gastrointestinal hemorrhage, unspecified: Secondary | ICD-10-CM | POA: Diagnosis not present

## 2017-08-22 DIAGNOSIS — C787 Secondary malignant neoplasm of liver and intrahepatic bile duct: Secondary | ICD-10-CM

## 2017-08-22 DIAGNOSIS — E871 Hypo-osmolality and hyponatremia: Secondary | ICD-10-CM | POA: Diagnosis not present

## 2017-08-22 DIAGNOSIS — E8809 Other disorders of plasma-protein metabolism, not elsewhere classified: Secondary | ICD-10-CM

## 2017-08-22 DIAGNOSIS — Z79899 Other long term (current) drug therapy: Secondary | ICD-10-CM

## 2017-08-22 DIAGNOSIS — Z9221 Personal history of antineoplastic chemotherapy: Secondary | ICD-10-CM

## 2017-08-22 DIAGNOSIS — Z87891 Personal history of nicotine dependence: Secondary | ICD-10-CM

## 2017-08-22 DIAGNOSIS — Z8719 Personal history of other diseases of the digestive system: Secondary | ICD-10-CM | POA: Diagnosis not present

## 2017-08-22 DIAGNOSIS — R64 Cachexia: Secondary | ICD-10-CM

## 2017-08-22 DIAGNOSIS — F419 Anxiety disorder, unspecified: Secondary | ICD-10-CM

## 2017-08-22 DIAGNOSIS — S32811A Multiple fractures of pelvis with unstable disruption of pelvic ring, initial encounter for closed fracture: Secondary | ICD-10-CM | POA: Diagnosis not present

## 2017-08-22 DIAGNOSIS — R5383 Other fatigue: Secondary | ICD-10-CM

## 2017-08-22 DIAGNOSIS — F329 Major depressive disorder, single episode, unspecified: Secondary | ICD-10-CM | POA: Diagnosis not present

## 2017-08-22 DIAGNOSIS — S32501A Unspecified fracture of right pubis, initial encounter for closed fracture: Secondary | ICD-10-CM | POA: Diagnosis not present

## 2017-08-22 DIAGNOSIS — R55 Syncope and collapse: Secondary | ICD-10-CM | POA: Diagnosis not present

## 2017-08-22 DIAGNOSIS — Y939 Activity, unspecified: Secondary | ICD-10-CM

## 2017-08-22 DIAGNOSIS — R531 Weakness: Secondary | ICD-10-CM

## 2017-08-22 DIAGNOSIS — R634 Abnormal weight loss: Secondary | ICD-10-CM

## 2017-08-22 DIAGNOSIS — J449 Chronic obstructive pulmonary disease, unspecified: Secondary | ICD-10-CM

## 2017-08-22 DIAGNOSIS — I6523 Occlusion and stenosis of bilateral carotid arteries: Secondary | ICD-10-CM | POA: Diagnosis not present

## 2017-08-22 DIAGNOSIS — S32599A Other specified fracture of unspecified pubis, initial encounter for closed fracture: Secondary | ICD-10-CM | POA: Diagnosis not present

## 2017-08-22 DIAGNOSIS — C799 Secondary malignant neoplasm of unspecified site: Secondary | ICD-10-CM | POA: Diagnosis not present

## 2017-08-22 DIAGNOSIS — G893 Neoplasm related pain (acute) (chronic): Secondary | ICD-10-CM

## 2017-08-22 LAB — ECHOCARDIOGRAM COMPLETE
HEIGHTINCHES: 70 in
WEIGHTICAEL: 1840 [oz_av]

## 2017-08-22 LAB — TROPONIN I
Troponin I: <0.03 ng/mL (ref <0.03)
Troponin I: <0.03 ng/mL (ref <0.03)

## 2017-08-22 MED ORDER — HYDROCODONE-ACETAMINOPHEN 5-325 MG PO TABS: 1.0000 | ORAL_TABLET | ORAL | Status: DC | PRN

## 2017-08-22 MED ORDER — ENOXAPARIN SODIUM 40 MG/0.4ML ~~LOC~~ SOLN
40.0000 mg | SUBCUTANEOUS | Status: DC
  Administered 2017-08-22: 17:00:00 40 mg via SUBCUTANEOUS
  Filled 2017-08-22: qty 0.4

## 2017-08-22 MED ORDER — ALPRAZOLAM 0.5 MG PO TABS: 0.2500 mg | ORAL_TABLET | Freq: Three times a day (TID) | ORAL | Status: DC | PRN

## 2017-08-22 MED ORDER — HYDROCODONE-ACETAMINOPHEN 5-325 MG PO TABS
1.0000 | ORAL_TABLET | ORAL | Status: DC | PRN
  Administered 2017-08-22: 14:00:00 1 via ORAL
  Filled 2017-08-22: qty 1

## 2017-08-22 NOTE — Progress Notes (Signed)
Physical Therapy Treatment Patient Details Name: Larry Wood MRN: [id number] DOB: 09/21/1946 Today's Date: 08/22/2017    History of Present Illness presented to ER secondary to unwitnessed fall in home environment with acute onset of back, pelvic and bilat hip pain.  Per CT of pelvis, noted with R superior/inferior pubic rami fractures, L sacral ala fracture and L anterior acetabular wall fracture.  Per notes, patient to be WBAT bilat LEs.    PT Comments    Pain better controlled, rated 5/10 this date.  PAtient with marked improvement in activity tolerance and functional ability this date.  Able to complete bed mobility, sit/stand and very short-distance gait (10') with RW, mod assist +1-2 for safety.  Very slow and guarded, but with good effort to attempt/complete.  Will continue to progress as pain allows.    Follow Up Recommendations  SNF     Equipment Recommendations       Recommendations for Other Services       Precautions / Restrictions Precautions Precautions: Fall Restrictions Weight Bearing Restrictions: Yes RLE Weight Bearing: Weight bearing as tolerated LLE Weight Bearing: Weight bearing as tolerated    Mobility  Bed Mobility Overal bed mobility: Needs Assistance Bed Mobility: Supine to Sit     Supine to sit: Mod assist     General bed mobility comments: assist for bilat LE management and truncal elevation; much more active effort with task thsi date  Transfers Overall transfer level: Needs assistance Equipment used: Rolling walker (2 wheeled) Transfers: Sit to/from Stand Sit to Stand: Mod assist;+2 safety/equipment         General transfer comment: cuing for hand placement; increased time required for movement transition  Ambulation/Gait Ambulation/Gait assistance: Mod assist;+2 safety/equipment Ambulation Distance (Feet): 10 Feet Assistive device: Rolling walker (2 wheeled)       General Gait Details: 3-point, step to gait pattern;  very slow and effortful with heavy WBing bilat UEs. Unable to tolerate additional distance due to pain.   Stairs            Wheelchair Mobility    Modified Rankin (Stroke Patients Only)       Balance Overall balance assessment: Needs assistance Sitting-balance support: No upper extremity supported;Feet supported Sitting balance-Leahy Scale: Fair     Standing balance support: Bilateral upper extremity supported Standing balance-Leahy Scale: Poor                              Cognition Arousal/Alertness: Awake/alert Behavior During Therapy: WFL for tasks assessed/performed Overall Cognitive Status: Within Functional Limits for tasks assessed                                        Exercises      General Comments        Pertinent Vitals/Pain Pain Assessment: 0-10 Pain Score: 5  Pain Location: 5/10 at rest Pain Descriptors / Indicators: Aching;Grimacing;Guarding Pain Intervention(s): Limited activity within patient's tolerance;Monitored during session;Repositioned;Premedicated before session    Home Living                      Prior Function            PT Goals (current goals can now be found in the care plan section) Acute Rehab PT Goals Patient Stated Goal: to make the pain go away PT Goal  Formulation: With patient Time For Goal Achievement: 09/04/17 Potential to Achieve Goals: Fair Progress towards PT goals: Progressing toward goals    Frequency    7X/week      PT Plan Current plan remains appropriate    Co-evaluation              AM-PAC PT "6 Clicks" Daily Activity  Outcome Measure  Difficulty turning over in bed (including adjusting bedclothes, sheets and blankets)?: Unable Difficulty moving from lying on back to sitting on the side of the bed? : Unable Difficulty sitting down on and standing up from a chair with arms (e.g., wheelchair, bedside commode, etc,.)?: Unable Help needed moving to and  from a bed to chair (including a wheelchair)?: A Lot Help needed walking in hospital room?: A Lot Help needed climbing 3-5 steps with a railing? : A Lot 6 Click Score: 9    End of Session Equipment Utilized During Treatment: Gait belt Activity Tolerance: Patient tolerated treatment well;Patient limited by pain Patient left: in chair;with call bell/phone within reach;with chair alarm set;with family/visitor present Nurse Communication: Mobility status PT Visit Diagnosis: Difficulty in walking, not elsewhere classified (R26.2);Muscle weakness (generalized) (M62.81);Pain Pain - Right/Left: Left Pain - part of body: Hip     Time: 5643-3295 PT Time Calculation (min) (ACUTE ONLY): 24 min  Charges:  $Gait Training: 8-22 mins $Therapeutic Activity: 8-22 mins                    G Codes:       Brandun Pinn H. Owens Shark, PT, DPT, NCS 08/22/17, 1:37 PM [phone]

## 2017-08-22 NOTE — Progress Notes (Signed)
*  PRELIMINARY RESULTS* Echocardiogram 2D Echocardiogram has been performed.  Larry Wood 08/22/2017, 10:53 AM

## 2017-08-22 NOTE — Consult Note (Signed)
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  He unfortunately progressed through FOLFOX quickly and is currently on second line palliative immunotherapy with Keytruda.  He has been having intermittent upper GI bleeding from his tumor requiring blood transfusions and recently underwent upper endoscopy with HemoSpray for possible control of bleeding from the tumor.  Last dose of Keytruda was on 08/10/2017  Patient presented to the ER on 08/21/2017 after he had a fall in the kitchen presumably after a syncopal episode and struck the left side of his face.  He complained of right groin pain upon movement and underwent CT pelvis without contrast which showed acute right superior and inferior pubic rami, left sacral alar and left anterior acetabular wall fractures and minimally displaced involving the superior pubic ramus fracture.  CT head did not show any evidence of fracture or head bleed.  He is currently undergoing syncopal workup.  No surgical intervention per orthopedics.  Patient just heard some time back today that his wife passed away at home due to excessive alcohol intake.  He is currently depressed and heartbroken since he was married to her for 34 years.  He tells me that she has had a long-standing alcohol problem and he had tried to send her to the rehab.  He is concerned about his future in the light of his recent fall which would limit his mobility.  His sister lives in [place] and has been living with  him for the past few months to help him out at his home.  She is willing to come and live with him upon discharge as long as he is independent within the house and there are 5 steps to get into his house.  He reports that his pain is currently not well controlled and it gets worse with ambulation.  Also reports feelings of anxiety.   ECOG PS- 2-3  Pain scale- 5   Review of systems- Review of Systems  Constitutional: Positive for malaise/fatigue and weight loss. Negative for chills and fever.  HENT: Negative for congestion, ear discharge and nosebleeds.   Eyes: Negative for blurred vision.  Respiratory: Negative for cough, hemoptysis, sputum production, shortness of breath and wheezing.   Cardiovascular: Positive for leg swelling. Negative for chest pain, palpitations, orthopnea and claudication.  Gastrointestinal: Negative for abdominal pain, blood in stool, constipation, diarrhea, heartburn, melena, nausea and vomiting.  Genitourinary: Negative for dysuria, flank pain, frequency, hematuria and urgency.  Musculoskeletal: Negative for back pain, joint pain and myalgias.  Skin: Negative for rash.  Neurological: Positive for weakness. Negative for dizziness, tingling, focal weakness, seizures and headaches.  Endo/Heme/Allergies: Does not bruise/bleed easily.  Psychiatric/Behavioral: Negative for depression and suicidal ideas. The patient does not have insomnia.     No Known Allergies  Patient Active Problem List   Diagnosis Date Noted  . Multiple closed pelvic fractures with disruption of pelvic circle ([place]) 08/21/2017  . Cachexia ([place]) 05/15/2017  . Encounter for antineoplastic chemotherapy 04/12/2017  . Neoplasm related pain 04/12/2017  . Iron deficiency anemia due to chronic  blood loss 03/27/2017  . Liver metastases ([place]) 03/27/2017  . Goals of care, counseling/discussion 03/27/2017  . Metastasis from esophageal cancer ([place]) 03/19/2017  . Esophageal cancer, stage IV ([place])  03/19/2017  . Epigastric abdominal tenderness 03/10/2017  . Gastric mass 03/10/2017  . Hyponatremia 03/09/2017     Past Medical History:  Diagnosis Date  . Cancer ([place])   . COPD (chronic obstructive pulmonary disease) ([place])      Past Surgical History:  Procedure Laterality Date  . ESOPHAGOGASTRODUODENOSCOPY (EGD) WITH PROPOFOL N/A 03/10/2017   Procedure: ESOPHAGOGASTRODUODENOSCOPY (EGD) WITH PROPOFOL;  Surgeon: Wilford Corner, MD;  Location: [facility] ENDOSCOPY;  Service: Endoscopy;  Laterality: N/A;  . ESOPHAGOGASTRODUODENOSCOPY (EGD) WITH PROPOFOL N/A 08/17/2017   Procedure: ESOPHAGOGASTRODUODENOSCOPY (EGD) WITH PROPOFOL WITH HEMOSPRAY;  Surgeon: Jonathon Bellows, MD;  Location: [facility] ENDOSCOPY;  Service: Gastroenterology;  Laterality: N/A;  . KNEE ARTHROSCOPY  1991  . PORTA CATH INSERTION N/A 03/21/2017   Procedure: Glori Luis Cath Insertion;  Surgeon: Algernon Huxley, MD;  Location: [facility];  Service: Cardiovascular;  Laterality: N/A;  . TONSILLECTOMY      Social History   Socioeconomic History  . Marital status: Married    Spouse name: Not on file  . Number of children: Not on file  . Years of education: Not on file  . Highest education level: Not on file  Social Needs  . Financial resource strain: Not on file  . Food insecurity - worry: Not on file  . Food insecurity - inability: Not on file  . Transportation needs - medical: Not on file  . Transportation needs - non-medical: Not on file  Occupational History  . Not on file  Tobacco Use  . Smoking status: Former Smoker    Last attempt to quit: 06/2010    Years since quitting: 7.2  . Smokeless tobacco: Never Used  Substance and Sexual Activity  . Alcohol use: No  . Drug use: No  . Sexual activity: Not Currently  Other Topics Concern  . Not on file  Social History Narrative  . Not on file     No family history on file.   Current Facility-Administered Medications:  .  0.9 %  sodium chloride infusion, ,  Intravenous, Continuous, Salary, Montell D, MD, Last Rate: 75 mL/hr at 08/21/17 2137 .  acetaminophen (TYLENOL) tablet 650 mg, 650 mg, Oral, Q6H PRN **OR** acetaminophen (TYLENOL) suppository 650 mg, 650 mg, Rectal, Q6H PRN, Salary, Montell D, MD .  megestrol (MEGACE) tablet 40 mg, 40 mg, Oral, BID, Salary, Montell D, MD, 40 mg at 08/21/17 2137 .  morphine (MS CONTIN) 12 hr tablet 30 mg, 30 mg, Oral, Q12H, Salary, Montell D, MD, 30 mg at 08/21/17 2137 .  morphine 2 MG/ML injection 2 mg, 2 mg, Intravenous, Q1H PRN, Salary, Montell D, MD .  ondansetron (ZOFRAN) tablet 4 mg, 4 mg, Oral, Q6H PRN **OR** ondansetron (ZOFRAN) injection 4 mg, 4 mg, Intravenous, Q6H PRN, Salary, Montell D, MD .  pantoprazole (PROTONIX) EC tablet 40 mg, 40 mg, Oral, BID, Salary, Montell D, MD, 40 mg at 08/21/17 2137 .  polyethylene glycol (MIRALAX / GLYCOLAX) packet 17 g, 17 g, Oral, Daily PRN, Salary, Montell D, MD .  promethazine (PHENERGAN) tablet 12.5 mg, 12.5 mg, Oral, QID PRN, Salary, Montell D, MD .  sodium chloride flush (NS) 0.9 % injection 3 mL, 3 mL, Intravenous, Q12H, Salary, Montell D, MD  Facility-Administered Medications Ordered in Other Encounters:  .  sodium chloride flush (NS) 0.9 %  injection 3 mL, 3 mL, Intracatheter, PRN, Sindy Guadeloupe, MD   Physical exam:  Vitals:   08/21/17 2000 08/21/17 2034 08/22/17 0409 08/22/17 0445  BP: 128/71 122/68 134/69   Pulse: 90 87 86   Resp:  18 16   Temp:  97.9 F (36.6 C) 98.7 F (37.1 C)   TempSrc:  Oral    SpO2: 93% 96% 95%   Weight:    115 lb (52.2 kg)  Height:       Physical Exam  Constitutional: He is oriented to person, place, and time.  Thin frail gentleman who appears fatigued and depressed  HENT:  Head: Normocephalic and atraumatic.  Eyes: EOM are normal. Pupils are equal, round, and reactive to light.  Neck: Normal range of motion.  Cardiovascular: Normal rate, regular rhythm and normal heart sounds.  Pulmonary/Chest: Effort normal and breath  sounds normal.  Abdominal: Soft. Bowel sounds are normal.  Musculoskeletal: He exhibits edema.  Neurological: He is alert and oriented to person, place, and time.  Skin: Skin is warm and dry.       CMP Latest Ref Rng & Units 08/21/2017  Glucose 65 - 99 mg/dL 95  BUN 6 - 20 mg/dL 13  Creatinine 0.61 - 1.24 mg/dL 0.57(L)  Sodium 135 - 145 mmol/L 130(L)  Potassium 3.5 - 5.1 mmol/L 4.1  Chloride 101 - 111 mmol/L 98(L)  CO2 22 - 32 mmol/L 26  Calcium 8.9 - 10.3 mg/dL 8.0(L)  Total Protein 6.5 - 8.1 g/dL -  Total Bilirubin 0.3 - 1.2 mg/dL -  Alkaline Phos 38 - 126 U/L -  AST 15 - 41 U/L -  ALT 17 - 63 U/L -   CBC Latest Ref Rng & Units 08/21/2017  WBC 3.8 - 10.6 K/uL 9.0  Hemoglobin 13.0 - 18.0 g/dL 9.3(L)  Hematocrit 40.0 - 52.0 % 28.4(L)  Platelets 150 - 440 K/uL 262    @IMAGES @  Dg Lumbar Spine 2-3 Views  Result Date: 08/21/2017 CLINICAL DATA:  Pain following fall EXAM: LUMBAR SPINE - 2-3 VIEW COMPARISON:  None. FINDINGS: Frontal, lateral, and spot lumbosacral lateral images were obtained. There are 5 non-rib-bearing lumbar type vertebral bodies. There is no demonstrable fracture or spondylolisthesis. There is moderately severe disc space narrowing at L5-S1. Other disc spaces appear unremarkable. No erosive change. There is aortoiliac atherosclerosis. IMPRESSION: Moderately severe disc space narrowing at L5-S1. Other disc spaces appear unremarkable. No acute fracture or spondylolisthesis. There is aortoiliac atherosclerosis. Aortic Atherosclerosis (ICD10-I70.0). Electronically Signed   By: Lowella Grip III M.D.   On: 08/21/2017 11:40   Ct Head Wo Contrast  Result Date: 08/21/2017 CLINICAL DATA:  Unwitnessed fall. EXAM: CT HEAD WITHOUT CONTRAST TECHNIQUE: Contiguous axial images were obtained from the base of the skull through the vertex without intravenous contrast. COMPARISON:  None. FINDINGS: Brain: No evidence of acute infarction, hemorrhage, hydrocephalus, extra-axial  collection or mass lesion/mass effect. Mild to moderate age related cerebral atrophy. Vascular: Atherosclerotic vascular calcification of the carotid siphons. No hyperdense vessel. Skull: Normal. Negative for fracture or focal lesion. Sinuses/Orbits: Minimal partial opacification of the left mastoid air cells. Otherwise, no acute finding. Other: None. IMPRESSION: No acute intracranial abnormality. Electronically Signed   By: Titus Dubin M.D.   On: 08/21/2017 12:07   Ct Pelvis Wo Contrast  Result Date: 08/21/2017 CLINICAL DATA:  Patient found on floor without loss of consciousness. Right groin pain with movement. EXAM: CT PELVIS WITHOUT CONTRAST TECHNIQUE: Multidetector CT imaging of the pelvis was performed  following the standard protocol without intravenous contrast. COMPARISON:  06/29/2017 CT FINDINGS: Urinary Tract: No hydroureteronephrosis. Physiologically distended urinary bladder without calculus or mass. No focal mural thickening. Bowel: No bowel obstruction. Colonic diverticulosis with circular muscle hypertrophy along the sigmoid colon. Vascular/Lymphatic: Moderate to marked atherosclerosis of the included distal abdominal aorta, common iliacs and branch vessels. No lymphadenopathy identified. Reproductive:  Normal size prostate. Other: Hypodense masses of the included right hepatic lobe consistent with known metastatic disease. Moderate ascites and diffuse soft tissue anasarca. Musculoskeletal: Acute left sacral alar fracture without intra-articular extension, series 2, image 40 through 45. Acute right superior and inferior pubic rami fractures with minimal displacement of the superior pubic ramus fracture near the pubic symphysis. Acute, nondisplaced anterior wall fracture of the left acetabulum. Osteoarthritic joint space narrowing of both hips. No joint dislocation. The bones are demineralized in appearance and there are more focal areas of indistinct osteopenia involving the bony pelvis in  particular the right iliac bone, series 6, image 22 an about both acetabular roofs. These may represent subtle lytic lesions. The included lower lumbar spine demonstrates degenerative disc and facet arthropathy. IMPRESSION: 1. Acute right superior and inferior pubic rami, left sacral alar and left anterior acetabular wall fractures are noted, minimally displaced involving the right superior pubic ramus fracture, otherwise without significant displacement nor joint dislocations. No proximal femoral fracture. 2. Osteopenic appearance of the bony pelvis with more focal areas of osteopenia in particular the right iliac bone. Early changes of lytic metastasis is not excluded. 3. Re- demonstration of hepatic metastasis of the included right hepatic lobe. 4. Diffuse soft tissue anasarca and ascites. 5. Colonic diverticulosis without acute diverticulitis. 6. Aortoiliac and branch vessel atherosclerosis. Electronically Signed   By: Ashley Royalty M.D.   On: 08/21/2017 14:39   Dg Hip Unilat W Or Wo Pelvis 2-3 Views Left  Result Date: 08/21/2017 CLINICAL DATA:  Fall.  Groin pain. EXAM: DG HIP (WITH OR WITHOUT PELVIS) 2-3V LEFT COMPARISON:  CT 06/29/2017. FINDINGS: Diffuse osteopenia degenerative change. No acute bony or joint abnormality identified. No evidence of fracture dislocation. Aortoiliac and peripheral vascular calcification. IMPRESSION: 1. Diffuse osteopenia degenerative change. 2.  Aortoiliac and peripheral vascular disease. Electronically Signed   By: Marcello Moores  Register   On: 08/21/2017 11:40   Dg Hip Unilat W Or Wo Pelvis 2-3 Views Right  Result Date: 08/21/2017 CLINICAL DATA:  Fall.  Groin pain.  Back pain. EXAM: DG HIP (WITH OR WITHOUT PELVIS) 2-3V RIGHT COMPARISON:  CT 06/29/2017. FINDINGS: Diffuse osteopenia and degenerative change. No acute bony or joint abnormality identified. No evidence fracture or dislocation. Peripheral vascular calcification. IMPRESSION: 1.  Diffuse osteopenia degenerative change.   No acute abnormality. 2. Peripheral vascular disease . Electronically Signed   By: Marcello Moores  Register   On: 08/21/2017 11:39    Assessment and plan- Patient is a 71 y.o. male with stage IV esophageal cancer and liver metastases currently on second line palliative immunotherapy with Keytruda admitted after syncopal episode and fall complicated by pelvic fractures  I spent more than 30 minutes talking to the patient and his sister.  Patient used to live with his wife who unfortunately has died this morning.  Patient's sister is willing to live with him upon his discharge but in the light of pelvic fractures I suspect patient may land up needing subacute rehab.  Patient's performance status was declining and he was receiving Keytruda as an outpatient as second line palliative treatment.  I discussed that at this time  given his declining performance status as well as impaired mobility hospice would be a reasonable option to consider especially if he has 24-hour care from his sister.  Patient is agreeable to speaking to hospice and it would be ideal for hospice to come and speak to him while he is inpatient if possible.  He is a DNR  Recommend continuing morphine SR but increasing his dose of morphine IR or IV morphine given that his pain remains uncontrolled.  Recommend palliative care consult to assist with symptom management and goals of care  I will tentatively see him after his discharge and discuss further management options as an outpatient  Chronic blood loss anemia: Currently H&H is stable after recent EGD and hemospray.  He reports his appetite and taste has improved since the procedure  Chronic bilateral lower extremity edema secondary to advanced malignancy and hypo-albuminemia.    Thank you for this kind referral and the opportunity to participate in the care of this patient   Visit Diagnosis 1. Lumbar contusion, initial encounter   2. Minor head injury, initial encounter   3. Anemia,  unspecified type   4. Chronic hyponatremia   5. Closed fracture of right superior pubic ramus, initial encounter ([place])   6. Closed fracture of right inferior pubic ramus, initial encounter ([place])   7. Closed fracture of sacrum, unspecified fracture morphology, initial encounter ([facility])   8. Closed posterior wall acetabular fx, left, initial encounter ([place])   9. Syncope and collapse     Dr. Randa Evens, MD, MPH [facility] at [facility] Pager- [phone] 08/22/2017 3:35 PM

## 2017-08-22 NOTE — Care Management (Signed)
Received referral for discharge planning. Physical therapy evaluation completed. Recommending skilled nursing facility. Clinical Social Worker, Eric updated Shelbie Ammons RN MSN CCM Care Management [phone]

## 2017-08-22 NOTE — Progress Notes (Signed)
Initial Nutrition Assessment  DOCUMENTATION CODES:   Severe malnutrition in context of chronic illness, Underweight  INTERVENTION:  Recommend liberalizing diet to regular.  Patient does not want any oral nutrition supplements ordered. He also refused offer to order snacks. Encouraged patient to choose foods he enjoys.  NUTRITION DIAGNOSIS:   Severe Malnutrition related to chronic illness(Stg IV GE junction adenocarcinoma) as evidenced by severe fat depletion, severe muscle depletion.  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   Met with patient and family members at bedside. Patient is known to this RD from previous admission. He reports his appetite has actually been better lately until his pain has picked up lately. He reports he had been eating 3-4 small meals per day. He enjoys potatoes, beans, and corn. Patient endorses some abdominal pain. He reports he does not drink any oral nutrition supplements because he is tired of all of them. He does not want any here, either. He denies any difficulty chewing/swallowing.  Reports UBW was 130 lbs. Patient does not know exact time frame for weight loss. He was 113.1 lbs back on 03/11/2017. He reports he has actually gained back some weight recently. Per chart he was 119.8 lbs.   Meal Completion: 30% of lunch today per chart  Medications reviewed and include: pantoprazole, NS @ 75 mL/hr.  Labs reviewed: Sodium 130, Chloride 98, Creatinine 0.57.  Discussed with RN.  NUTRITION - FOCUSED PHYSICAL EXAM:    Most Recent Value  Orbital  Region  Severe depletion  Upper Arm Region  Severe depletion  Thoracic and Lumbar Region  Severe depletion  Buccal Region  Severe depletion  Temple Region  Severe depletion  Clavicle Bone Region  Severe depletion  Clavicle and Acromion Bone Region  Severe depletion  Scapular Bone Region  Severe depletion  Dorsal Hand  Severe depletion  Patellar Region  Severe depletion  Anterior Thigh Region  Severe depletion  Posterior Calf Region  Severe depletion  Edema (RD Assessment)  Moderate  Hair  Reviewed  Eyes  Reviewed  Mouth  Reviewed  Skin  Reviewed  Nails  Reviewed     Diet Order:  Diet Heart Room service appropriate? Yes; Fluid consistency: Thin  EDUCATION NEEDS:   Not appropriate for education at this time  Skin:  Skin Assessment: Reviewed RN Assessment  Last BM:  08/21/2017  Height:   Ht Readings from Last 1 Encounters:  08/21/17 _0  (1.778 m)    Weight:   Wt Readings from Last 1 Encounters:  08/22/17 115 lb (52.2 kg)    Ideal Body Weight:  75.5 kg  BMI:  Body mass index is 16.5 kg/m.  Estimated Nutritional Needs:   Kcal:  1690-1950 (MSJ x 1.3-1.5)  Protein:  80-95 grams (1.5-1.8 grams/kg)  Fluid:  1.6-1.8 L/day (30-35 mL/kg)  Willey Blade, MS, RD, LDN Office: [phone] Pager: [phone] After Hours/Weekend Pager: [phone]

## 2017-08-22 NOTE — Consult Note (Signed)
ORTHOPAEDIC CONSULTATION  REQUESTING PHYSICIAN: Larry Lighter, MD  Chief Complaint: Pelvic pain  HPI: Larry Wood is a 71 y.o. male who complains of pain following a fall at home yesterday morning.  The patient was brought to the emergency room where exam and x-rays followed by CT scan showed nondisplaced right superior and inferior inferior pubic rami fractures, acute left sacral fracture and a left acetabular fracture.  These were all nondisplaced and were not visible on plain x-rays at all.  Patient is being treated for metastatic gastroesophageal cancer COPD and anemia.  His wife also passed away yesterday.  He is admitted for pain management and mobilization.  Past Medical History:  Diagnosis Date  . Cancer ([place])   . COPD (chronic obstructive pulmonary disease) ([place])    Past Surgical History:  Procedure Laterality Date  . ESOPHAGOGASTRODUODENOSCOPY (EGD) WITH PROPOFOL N/A 03/10/2017   Procedure: ESOPHAGOGASTRODUODENOSCOPY (EGD) WITH PROPOFOL;  Surgeon: Larry Corner, MD;  Location: [facility] ENDOSCOPY;  Service: Endoscopy;  Laterality: N/A;  . ESOPHAGOGASTRODUODENOSCOPY (EGD) WITH PROPOFOL N/A 08/17/2017   Procedure: ESOPHAGOGASTRODUODENOSCOPY (EGD) WITH PROPOFOL WITH HEMOSPRAY;  Surgeon: Larry Bellows, MD;  Location: [facility] ENDOSCOPY;  Service: Gastroenterology;  Laterality: N/A;  . KNEE ARTHROSCOPY  1991  . PORTA CATH INSERTION N/A 03/21/2017   Procedure: Larry Wood Cath Insertion;  Surgeon: Larry Huxley, MD;  Location: [place] CV LAB;  Service: Cardiovascular;  Laterality: N/A;  . TONSILLECTOMY     Social History   Socioeconomic History  . Marital status: Married    Spouse name: None  . Number of children: None  . Years of education: None  . Highest education level: None  Social Needs  . Financial resource strain: None  . Food insecurity - worry: None  . Food insecurity - inability: None  . Transportation needs - medical: None  . Transportation needs -  non-medical: None  Occupational History  . None  Tobacco Use  . Smoking status: Former Smoker    Last attempt to quit: 06/2010    Years since quitting: 7.2  . Smokeless tobacco: Never Used  Substance and Sexual Activity  . Alcohol use: No  . Drug use: No  . Sexual activity: Not Currently  Other Topics Concern  . None  Social History Narrative  . None   No family history on file. No Known Allergies Prior to Admission medications   Medication Sig Start Date End Date Taking? Authorizing Provider  morphine (MS CONTIN) 30 MG 12 hr tablet Take 1 tablet (30 mg total) every 12 (twelve) hours by mouth. 08/10/17  Yes Sindy Guadeloupe, MD  pantoprazole (PROTONIX) 40 MG tablet Take 1 tablet (40 mg total) by mouth 2 (two) times daily. 07/12/17 07/12/18 Yes Sindy Guadeloupe, MD  promethazine (PHENERGAN) 12.5 MG tablet TAKE 1 TABLET BY MOUTH FOUR TIMES DAILY Patient taking differently: TAKE 1 TABLET BY MOUTH FOUR TIMES DAILY AS NEEDED 08/06/17  Yes Sindy Guadeloupe, MD  furosemide (LASIX) 20 MG tablet Take 1 tablet (20 mg total) by mouth daily as needed. Patient not taking: Reported on 07/03/2017 05/04/17   Sindy Guadeloupe, MD  megestrol (MEGACE) 40 MG tablet Take 1 tablet (40 mg total) by mouth 2 (two) times daily. Patient not taking: Reported on 06/26/2017 06/12/17   Sindy Guadeloupe, MD  prochlorperazine (COMPAZINE) 10 MG tablet Take 1 tablet (10 mg total) by mouth every 6 (six) hours as needed (Nausea or vomiting). Patient not taking: Reported on 07/20/2017 04/09/17   Sindy Guadeloupe,  MD   Dg Lumbar Spine 2-3 Views  Result Date: 08/21/2017 CLINICAL DATA:  Pain following fall EXAM: LUMBAR SPINE - 2-3 VIEW COMPARISON:  None. FINDINGS: Frontal, lateral, and spot lumbosacral lateral images were obtained. There are 5 non-rib-bearing lumbar type vertebral bodies. There is no demonstrable fracture or spondylolisthesis. There is moderately severe disc space narrowing at L5-S1. Other disc spaces appear unremarkable. No  erosive change. There is aortoiliac atherosclerosis. IMPRESSION: Moderately severe disc space narrowing at L5-S1. Other disc spaces appear unremarkable. No acute fracture or spondylolisthesis. There is aortoiliac atherosclerosis. Aortic Atherosclerosis (ICD10-I70.0). Electronically Signed   By: Larry Wood M.D.   On: 08/21/2017 11:40   Ct Head Wo Contrast  Result Date: 08/21/2017 CLINICAL DATA:  Unwitnessed fall. EXAM: CT HEAD WITHOUT CONTRAST TECHNIQUE: Contiguous axial images were obtained from the base of the skull through the vertex without intravenous contrast. COMPARISON:  None. FINDINGS: Brain: No evidence of acute infarction, hemorrhage, hydrocephalus, extra-axial collection or mass lesion/mass effect. Mild to moderate age related cerebral atrophy. Vascular: Atherosclerotic vascular calcification of the carotid siphons. No hyperdense vessel. Skull: Normal. Negative for fracture or focal lesion. Sinuses/Orbits: Minimal partial opacification of the left mastoid air cells. Otherwise, no acute finding. Other: None. IMPRESSION: No acute intracranial abnormality. Electronically Signed   By: Larry Wood M.D.   On: 08/21/2017 12:07   Ct Pelvis Wo Contrast  Result Date: 08/21/2017 CLINICAL DATA:  Patient found on floor without loss of consciousness. Right groin pain with movement. EXAM: CT PELVIS WITHOUT CONTRAST TECHNIQUE: Multidetector CT imaging of the pelvis was performed following the standard protocol without intravenous contrast. COMPARISON:  06/29/2017 CT FINDINGS: Urinary Tract: No hydroureteronephrosis. Physiologically distended urinary bladder without calculus or mass. No focal mural thickening. Bowel: No bowel obstruction. Colonic diverticulosis with circular muscle hypertrophy along the sigmoid colon. Vascular/Lymphatic: Moderate to marked atherosclerosis of the included distal abdominal aorta, common iliacs and branch vessels. No lymphadenopathy identified. Reproductive:  Normal  size prostate. Other: Hypodense masses of the included right hepatic lobe consistent with known metastatic disease. Moderate ascites and diffuse soft tissue anasarca. Musculoskeletal: Acute left sacral alar fracture without intra-articular extension, series 2, image 40 through 45. Acute right superior and inferior pubic rami fractures with minimal displacement of the superior pubic ramus fracture near the pubic symphysis. Acute, nondisplaced anterior wall fracture of the left acetabulum. Osteoarthritic joint space narrowing of both hips. No joint dislocation. The bones are demineralized in appearance and there are more focal areas of indistinct osteopenia involving the bony pelvis in particular the right iliac bone, series 6, image 22 an about both acetabular roofs. These may represent subtle lytic lesions. The included lower lumbar spine demonstrates degenerative disc and facet arthropathy. IMPRESSION: 1. Acute right superior and inferior pubic rami, left sacral alar and left anterior acetabular wall fractures are noted, minimally displaced involving the right superior pubic ramus fracture, otherwise without significant displacement nor joint dislocations. No proximal femoral fracture. 2. Osteopenic appearance of the bony pelvis with more focal areas of osteopenia in particular the right iliac bone. Early changes of lytic metastasis is not excluded. 3. Re- demonstration of hepatic metastasis of the included right hepatic lobe. 4. Diffuse soft tissue anasarca and ascites. 5. Colonic diverticulosis without acute diverticulitis. 6. Aortoiliac and branch vessel atherosclerosis. Electronically Signed   By: Ashley Royalty M.D.   On: 08/21/2017 14:39   US Carotid Bilateral  Result Date: 08/22/2017 CLINICAL DATA:  Syncope and collapse EXAM: BILATERAL CAROTID DUPLEX ULTRASOUND TECHNIQUE: Pearline Cables scale  imaging, color Doppler and duplex ultrasound were performed of bilateral carotid and vertebral arteries in the neck.  COMPARISON:  None. FINDINGS: Criteria: Quantification of carotid stenosis is based on velocity parameters that correlate the residual internal carotid diameter with NASCET-based stenosis levels, using the diameter of the distal internal carotid lumen as the denominator for stenosis measurement. The following velocity measurements were obtained: RIGHT ICA:  79 cm/sec CCA:  86 cm/sec SYSTOLIC ICA/CCA RATIO:  1.4 DIASTOLIC ICA/CCA RATIO:  2.2 ECA:  123 cm/sec LEFT ICA:  62 cm/sec CCA:  69 cm/sec SYSTOLIC ICA/CCA RATIO:  0.9 DIASTOLIC ICA/CCA RATIO:  1.3 ECA:  90 cm/sec RIGHT CAROTID ARTERY: Mild irregular calcified plaque in the bulb. Low resistance internal carotid Doppler pattern. RIGHT VERTEBRAL ARTERY:  Antegrade. LEFT CAROTID ARTERY: Mild irregular calcified plaque in the bulb. Low resistance internal carotid Doppler pattern is preserved. LEFT VERTEBRAL ARTERY:  Antegrade. IMPRESSION: Less than 50% stenosis in the right and left internal carotid arteries. Electronically Signed   By: Marybelle Killings M.D.   On: 08/22/2017 12:30   Dg Hip Unilat W Or Wo Pelvis 2-3 Views Left  Result Date: 08/21/2017 CLINICAL DATA:  Fall.  Groin pain. EXAM: DG HIP (WITH OR WITHOUT PELVIS) 2-3V LEFT COMPARISON:  CT 06/29/2017. FINDINGS: Diffuse osteopenia degenerative change. No acute bony or joint abnormality identified. No evidence of fracture dislocation. Aortoiliac and peripheral vascular calcification. IMPRESSION: 1. Diffuse osteopenia degenerative change. 2.  Aortoiliac and peripheral vascular disease. Electronically Signed   By: Marcello Moores  Register   On: 08/21/2017 11:40   Dg Hip Unilat W Or Wo Pelvis 2-3 Views Right  Result Date: 08/21/2017 CLINICAL DATA:  Fall.  Groin pain.  Back pain. EXAM: DG HIP (WITH OR WITHOUT PELVIS) 2-3V RIGHT COMPARISON:  CT 06/29/2017. FINDINGS: Diffuse osteopenia and degenerative change. No acute bony or joint abnormality identified. No evidence fracture or dislocation. Peripheral vascular  calcification. IMPRESSION: 1.  Diffuse osteopenia degenerative change.  No acute abnormality. 2. Peripheral vascular disease . Electronically Signed   By: Marcello Moores  Register   On: 08/21/2017 11:39    Positive ROS: All other systems have been reviewed and were otherwise negative with the exception of those mentioned in the HPI and as above.  Physical Exam: General: Alert, no acute distress Cardiovascular: No pedal edema Respiratory: No cyanosis, no use of accessory musculature GI: No organomegaly, abdomen is soft and non-tender Skin: No lesions in the area of chief complaint Neurologic: Sensation intact distally Psychiatric: Patient is competent for consent with normal mood and affect Lymphatic: No axillary or cervical lymphadenopathy  MUSCULOSKELETAL: Patient is sitting up in a chair.  He is alert and cooperative.  Hips have good motion with mild pain.  Neurovascular status is good distally in both legs.  There is minimal swelling in the feet.  He is wearing compression hose.  Assessment: Nondisplaced pelvic fractures seen only on CT scan  Plan: Gradual mobilization and pain control. Revisit my office 2 weeks for follow-up x-rays.    Park Breed, MD [phone]   08/22/2017 5:44 PM

## 2017-08-22 NOTE — Progress Notes (Signed)
PT Cancellation Note  Patient Details Name: EDRIS FRIEDT MRN: [id number] DOB: 05/17/1946   Cancelled Treatment:    Reason Eval/Treat Not Completed: Patient at procedure or test/unavailable(Treatment session attempted.  Patient currently off unit for diagnostic testing. Will re-attempt at later time/date as medically appropriate and available.)   Gali Spinney H. Owens Shark, PT, DPT, NCS 08/22/17, 10:19 AM [phone]

## 2017-08-22 NOTE — Plan of Care (Signed)
Pt admitted early on the shift. Placed on low bed upon admission d/t admitting diagnosis. Placed in room close to nurses station, educated on fall risk, given appropriate call bell. Pt verbalized understanding.  Progressing Education: Knowledge of General Education information will improve 08/22/2017 0102 - Progressing by Jeffie Pollock, RN Health Behavior/Discharge Planning: Ability to manage health-related needs will improve 08/22/2017 0102 - Progressing by Jeffie Pollock, RN Clinical Measurements: Ability to maintain clinical measurements within normal limits will improve 08/22/2017 0102 - Progressing by Jeffie Pollock, RN Will remain free from infection 08/22/2017 0102 - Progressing by Jeffie Pollock, RN Diagnostic test results will improve 08/22/2017 0102 - Progressing by Jeffie Pollock, RN Respiratory complications will improve 08/22/2017 0102 - Progressing by Jeffie Pollock, RN Cardiovascular complication will be avoided 08/22/2017 0102 - Progressing by Jeffie Pollock, RN Activity: Risk for activity intolerance will decrease 08/22/2017 0102 - Progressing by Jeffie Pollock, RN Coping: Level of anxiety will decrease 08/22/2017 0102 - Progressing by Jeffie Pollock, RN Elimination: Will not experience complications related to bowel motility 08/22/2017 0102 - Progressing by Jeffie Pollock, RN Will not experience complications related to urinary retention 08/22/2017 0102 - Progressing by Jeffie Pollock, RN Safety: Ability to remain free from injury will improve 08/22/2017 0102 - Progressing by Jeffie Pollock, RN Skin Integrity: Risk for impaired skin integrity will decrease 08/22/2017 0102 - Progressing by Jeffie Pollock, RN

## 2017-08-22 NOTE — Plan of Care (Signed)
  Progressing Education: Knowledge of General Education information will improve 08/22/2017 1732 - Progressing by Daylene Posey, RN Health Behavior/Discharge Planning: Ability to manage health-related needs will improve 08/22/2017 1732 - Progressing by Daylene Posey, RN Clinical Measurements: Ability to maintain clinical measurements within normal limits will improve 08/22/2017 1732 - Progressing by Daylene Posey, RN Will remain free from infection 08/22/2017 1732 - Progressing by Daylene Posey, RN Diagnostic test results will improve 08/22/2017 1732 - Progressing by Daylene Posey, RN Respiratory complications will improve 08/22/2017 1732 - Progressing by Daylene Posey, RN Cardiovascular complication will be avoided 08/22/2017 1732 - Progressing by Daylene Posey, RN Activity: Risk for activity intolerance will decrease 08/22/2017 1732 - Progressing by Daylene Posey, RN Nutrition: Adequate nutrition will be maintained 08/22/2017 1732 - Progressing by Daylene Posey, RN Coping: Level of anxiety will decrease 08/22/2017 1732 - Progressing by Daylene Posey, RN Elimination: Will not experience complications related to bowel motility 08/22/2017 1732 - Progressing by Daylene Posey, RN Will not experience complications related to urinary retention 08/22/2017 1732 - Progressing by Daylene Posey, RN Pain Managment: General experience of comfort will improve 08/22/2017 1732 - Progressing by Daylene Posey, RN Safety: Ability to remain free from injury will improve 08/22/2017 1732 - Progressing by Daylene Posey, RN Skin Integrity: Risk for impaired skin integrity will decrease 08/22/2017 1732 - Progressing by Daylene Posey, RN Nutritional: Maintenance of adequate nutrition will improve 08/22/2017 1732 - Progressing by Daylene Posey, RN

## 2017-08-22 NOTE — Progress Notes (Signed)
[facility] NAME: Larry Wood    MR#:  [id number]  DATE OF BIRTH:  Sep 08, 1946  SUBJECTIVE:  CHIEF COMPLAINT:   Chief Complaint  Patient presents with  . Fall   - Very emotional today, learnt about his wife's death this morning -Working with physical therapy, recommended she have. Still has pubic pain  REVIEW OF SYSTEMS:  Review of Systems  Constitutional: Positive for malaise/fatigue. Negative for chills and fever.  HENT: Negative for congestion, ear discharge, hearing loss and nosebleeds.   Eyes: Negative for blurred vision and double vision.  Respiratory: Negative for cough, shortness of breath and wheezing.   Cardiovascular: Negative for chest pain and palpitations.  Gastrointestinal: Negative for abdominal pain, constipation, diarrhea, nausea and vomiting.  Genitourinary: Negative for dysuria and urgency.  Musculoskeletal: Positive for falls and myalgias.  Neurological: Negative for dizziness, seizures and headaches.  Psychiatric/Behavioral: Positive for depression. The patient is nervous/anxious.     DRUG ALLERGIES:  No Known Allergies  VITALS:  Blood pressure (!) 105/53, pulse 88, temperature 98.2 F (36.8 C), temperature source Oral, resp. rate 18, height 5\' 10"  (1.778 m), weight 52.2 kg (115 lb), SpO2 96 %.  PHYSICAL EXAMINATION:  Physical Exam  GENERAL:  71 y.o.-year-old malnourished patient sitting in the recliner with no acute distress.  EYES: Pupils equal, round, reactive to light and accommodation. No scleral icterus. Extraocular muscles intact.  HEENT: Head atraumatic, normocephalic. Oropharynx and nasopharynx clear. Temporal wasting noted NECK:  Supple, no jugular venous distention. No thyroid enlargement, no tenderness.  LUNGS: Normal breath sounds bilaterally, no wheezing, rales,rhonchi or crepitation. No use of accessory muscles of respiration. Decreased bibasilar breath sounds CARDIOVASCULAR:  S1, S2 normal. No rubs, or gallops. 2/6 systolic murmur present ABDOMEN: Soft, nontender, nondistended. Bowel sounds present. No organomegaly or mass.  EXTREMITIES: No pedal edema, cyanosis, or clubbing.  NEUROLOGIC: Cranial nerves II through XII are intact. Muscle strength 5/5 in all extremities. Sensation intact. Gait not checked. Global weakness noted PSYCHIATRIC: The patient is alert and oriented x 3.  SKIN: No obvious rash, lesion, or ulcer.    LABORATORY PANEL:   CBC Recent Labs  Lab 08/21/17 1125  WBC 9.0  HGB 9.3*  HCT 28.4*  PLT 262   ------------------------------------------------------------------------------------------------------------------  Chemistries  Recent Labs  Lab 08/21/17 1125  NA 130*  K 4.1  CL 98*  CO2 26  GLUCOSE 95  BUN 13  CREATININE 0.57*  CALCIUM 8.0*   ------------------------------------------------------------------------------------------------------------------  Cardiac Enzymes Recent Labs  Lab 08/22/17 1240  TROPONINI <0.03   ------------------------------------------------------------------------------------------------------------------  RADIOLOGY:  Dg Lumbar Spine 2-3 Views  Result Date: 08/21/2017 CLINICAL DATA:  Pain following fall EXAM: LUMBAR SPINE - 2-3 VIEW COMPARISON:  None. FINDINGS: Frontal, lateral, and spot lumbosacral lateral images were obtained. There are 5 non-rib-bearing lumbar type vertebral bodies. There is no demonstrable fracture or spondylolisthesis. There is moderately severe disc space narrowing at L5-S1. Other disc spaces appear unremarkable. No erosive change. There is aortoiliac atherosclerosis. IMPRESSION: Moderately severe disc space narrowing at L5-S1. Other disc spaces appear unremarkable. No acute fracture or spondylolisthesis. There is aortoiliac atherosclerosis. Aortic Atherosclerosis (ICD10-I70.0). Electronically Signed   By: Lowella Grip III M.D.   On: 08/21/2017 11:40   Ct Head Wo  Contrast  Result Date: 08/21/2017 CLINICAL DATA:  Unwitnessed fall. EXAM: CT HEAD WITHOUT CONTRAST TECHNIQUE: Contiguous axial images were obtained from the base of the skull through the vertex without intravenous contrast. COMPARISON:  None.  FINDINGS: Brain: No evidence of acute infarction, hemorrhage, hydrocephalus, extra-axial collection or mass lesion/mass effect. Mild to moderate age related cerebral atrophy. Vascular: Atherosclerotic vascular calcification of the carotid siphons. No hyperdense vessel. Skull: Normal. Negative for fracture or focal lesion. Sinuses/Orbits: Minimal partial opacification of the left mastoid air cells. Otherwise, no acute finding. Other: None. IMPRESSION: No acute intracranial abnormality. Electronically Signed   By: Titus Dubin M.D.   On: 08/21/2017 12:07   Ct Pelvis Wo Contrast  Result Date: 08/21/2017 CLINICAL DATA:  Patient found on floor without loss of consciousness. Right groin pain with movement. EXAM: CT PELVIS WITHOUT CONTRAST TECHNIQUE: Multidetector CT imaging of the pelvis was performed following the standard protocol without intravenous contrast. COMPARISON:  06/29/2017 CT FINDINGS: Urinary Tract: No hydroureteronephrosis. Physiologically distended urinary bladder without calculus or mass. No focal mural thickening. Bowel: No bowel obstruction. Colonic diverticulosis with circular muscle hypertrophy along the sigmoid colon. Vascular/Lymphatic: Moderate to marked atherosclerosis of the included distal abdominal aorta, common iliacs and branch vessels. No lymphadenopathy identified. Reproductive:  Normal size prostate. Other: Hypodense masses of the included right hepatic lobe consistent with known metastatic disease. Moderate ascites and diffuse soft tissue anasarca. Musculoskeletal: Acute left sacral alar fracture without intra-articular extension, series 2, image 40 through 45. Acute right superior and inferior pubic rami fractures with minimal  displacement of the superior pubic ramus fracture near the pubic symphysis. Acute, nondisplaced anterior wall fracture of the left acetabulum. Osteoarthritic joint space narrowing of both hips. No joint dislocation. The bones are demineralized in appearance and there are more focal areas of indistinct osteopenia involving the bony pelvis in particular the right iliac bone, series 6, image 22 an about both acetabular roofs. These may represent subtle lytic lesions. The included lower lumbar spine demonstrates degenerative disc and facet arthropathy. IMPRESSION: 1. Acute right superior and inferior pubic rami, left sacral alar and left anterior acetabular wall fractures are noted, minimally displaced involving the right superior pubic ramus fracture, otherwise without significant displacement nor joint dislocations. No proximal femoral fracture. 2. Osteopenic appearance of the bony pelvis with more focal areas of osteopenia in particular the right iliac bone. Early changes of lytic metastasis is not excluded. 3. Re- demonstration of hepatic metastasis of the included right hepatic lobe. 4. Diffuse soft tissue anasarca and ascites. 5. Colonic diverticulosis without acute diverticulitis. 6. Aortoiliac and branch vessel atherosclerosis. Electronically Signed   By: Ashley Royalty M.D.   On: 08/21/2017 14:39   US Carotid Bilateral  Result Date: 08/22/2017 CLINICAL DATA:  Syncope and collapse EXAM: BILATERAL CAROTID DUPLEX ULTRASOUND TECHNIQUE: Pearline Cables scale imaging, color Doppler and duplex ultrasound were performed of bilateral carotid and vertebral arteries in the neck. COMPARISON:  None. FINDINGS: Criteria: Quantification of carotid stenosis is based on velocity parameters that correlate the residual internal carotid diameter with NASCET-based stenosis levels, using the diameter of the distal internal carotid lumen as the denominator for stenosis measurement. The following velocity measurements were obtained: RIGHT ICA:   79 cm/sec CCA:  86 cm/sec SYSTOLIC ICA/CCA RATIO:  1.4 DIASTOLIC ICA/CCA RATIO:  2.2 ECA:  123 cm/sec LEFT ICA:  62 cm/sec CCA:  69 cm/sec SYSTOLIC ICA/CCA RATIO:  0.9 DIASTOLIC ICA/CCA RATIO:  1.3 ECA:  90 cm/sec RIGHT CAROTID ARTERY: Mild irregular calcified plaque in the bulb. Low resistance internal carotid Doppler pattern. RIGHT VERTEBRAL ARTERY:  Antegrade. LEFT CAROTID ARTERY: Mild irregular calcified plaque in the bulb. Low resistance internal carotid Doppler pattern is preserved. LEFT VERTEBRAL ARTERY:  Antegrade. IMPRESSION:  Less than 50% stenosis in the right and left internal carotid arteries. Electronically Signed   By: Marybelle Killings M.D.   On: 08/22/2017 12:30   Dg Hip Unilat W Or Wo Pelvis 2-3 Views Left  Result Date: 08/21/2017 CLINICAL DATA:  Fall.  Groin pain. EXAM: DG HIP (WITH OR WITHOUT PELVIS) 2-3V LEFT COMPARISON:  CT 06/29/2017. FINDINGS: Diffuse osteopenia degenerative change. No acute bony or joint abnormality identified. No evidence of fracture dislocation. Aortoiliac and peripheral vascular calcification. IMPRESSION: 1. Diffuse osteopenia degenerative change. 2.  Aortoiliac and peripheral vascular disease. Electronically Signed   By: Marcello Moores  Register   On: 08/21/2017 11:40   Dg Hip Unilat W Or Wo Pelvis 2-3 Views Right  Result Date: 08/21/2017 CLINICAL DATA:  Fall.  Groin pain.  Back pain. EXAM: DG HIP (WITH OR WITHOUT PELVIS) 2-3V RIGHT COMPARISON:  CT 06/29/2017. FINDINGS: Diffuse osteopenia and degenerative change. No acute bony or joint abnormality identified. No evidence fracture or dislocation. Peripheral vascular calcification. IMPRESSION: 1.  Diffuse osteopenia degenerative change.  No acute abnormality. 2. Peripheral vascular disease . Electronically Signed   By: Marcello Moores  Register   On: 08/21/2017 11:39    EKG:   Orders placed or performed during the hospital encounter of 08/21/17  . ED EKG  . ED EKG  . ED EKG  . 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 -Nonsurgical, conservative treatment -Appreciate physical therapy consult. Patient does have osteopenia -Will need short-term rehabilitation. -Continue pain control. Orthopedics consulted.  #2 syncope-could be vasovagal. Echocardiogram with left ventricle hypertrophy with no wall motion abnormalities noted. -Carotid Dopplers with no evident significant stenosis. CT of the head showing chronic changes but no acute findings. -Agree with short-term rehabilitation at this time.  #3 stage IV gastroesophageal junction carcinoma-appreciate oncology consult. Patient on palliative immunotherapy, however oncologist recommended hospice at this time and discussed with the patient as well. -Maybe patient can follow up with palliative care at the rehabilitation and transition to hospice at discharge home  #4 COPD-stable. Not on any oxygen at this time.  #5 DVT prophylaxis-we'll start Lovenox  #6 acute grief reaction-patient is emotional due to his wife passing away last night. -On Xanax as needed at this time  Social worker consulted for rehab placement    All the records are reviewed and case discussed with Care Management/Social Workerr. Management plans discussed with the patient, family and they are in agreement.  CODE STATUS: DO NOT RESUSCITATE  TOTAL TIME TAKING CARE OF THIS PATIENT: 38 minutes.   POSSIBLE D/C IN 1-2 DAYS, DEPENDING ON CLINICAL CONDITION.   Gladstone Lighter M.D on 08/22/2017 at 4:30 PM  Between 7am to 6pm - Pager - [phone]  After 6pm go to [email or web address] - password [email or web address] [place]  [facility]  Office  [phone]  CC: Primary care physician; Derinda Late, MD

## 2017-08-23 ENCOUNTER — Emergency Department: Payer: PPO

## 2017-08-23 ENCOUNTER — Inpatient Hospital Stay
Admission: EM | Admit: 2017-08-23 | Discharge: 2017-08-30 | DRG: 199 | Disposition: A | Discharge status: Hospice | Payer: PPO | Attending: Internal Medicine | Admitting: Internal Medicine

## 2017-08-23 DIAGNOSIS — I5033 Acute on chronic diastolic (congestive) heart failure: Secondary | ICD-10-CM | POA: Diagnosis present

## 2017-08-23 DIAGNOSIS — Y92010 Kitchen of single-family (private) house as the place of occurrence of the external cause: Secondary | ICD-10-CM | POA: Diagnosis not present

## 2017-08-23 DIAGNOSIS — N390 Urinary tract infection, site not specified: Secondary | ICD-10-CM

## 2017-08-23 DIAGNOSIS — A419 Sepsis, unspecified organism: Secondary | ICD-10-CM | POA: Diagnosis not present

## 2017-08-23 DIAGNOSIS — F4320 Adjustment disorder, unspecified: Secondary | ICD-10-CM | POA: Diagnosis present

## 2017-08-23 DIAGNOSIS — Z66 Do not resuscitate: Secondary | ICD-10-CM | POA: Diagnosis present

## 2017-08-23 DIAGNOSIS — E860 Dehydration: Secondary | ICD-10-CM | POA: Diagnosis present

## 2017-08-23 DIAGNOSIS — Z4682 Encounter for fitting and adjustment of non-vascular catheter: Secondary | ICD-10-CM | POA: Diagnosis not present

## 2017-08-23 DIAGNOSIS — Z9221 Personal history of antineoplastic chemotherapy: Secondary | ICD-10-CM | POA: Diagnosis not present

## 2017-08-23 DIAGNOSIS — D649 Anemia, unspecified: Secondary | ICD-10-CM | POA: Diagnosis present

## 2017-08-23 DIAGNOSIS — J449 Chronic obstructive pulmonary disease, unspecified: Secondary | ICD-10-CM | POA: Diagnosis present

## 2017-08-23 DIAGNOSIS — R0602 Shortness of breath: Secondary | ICD-10-CM

## 2017-08-23 DIAGNOSIS — J441 Chronic obstructive pulmonary disease with (acute) exacerbation: Secondary | ICD-10-CM | POA: Diagnosis present

## 2017-08-23 DIAGNOSIS — E871 Hypo-osmolality and hyponatremia: Secondary | ICD-10-CM | POA: Diagnosis present

## 2017-08-23 DIAGNOSIS — Z938 Other artificial opening status: Secondary | ICD-10-CM

## 2017-08-23 DIAGNOSIS — C160 Malignant neoplasm of cardia: Secondary | ICD-10-CM | POA: Diagnosis present

## 2017-08-23 DIAGNOSIS — S32412A Displaced fracture of anterior wall of left acetabulum, initial encounter for closed fracture: Secondary | ICD-10-CM | POA: Diagnosis present

## 2017-08-23 DIAGNOSIS — G8929 Other chronic pain: Secondary | ICD-10-CM | POA: Diagnosis present

## 2017-08-23 DIAGNOSIS — E43 Unspecified severe protein-calorie malnutrition: Secondary | ICD-10-CM | POA: Diagnosis present

## 2017-08-23 DIAGNOSIS — S3219XA Other fracture of sacrum, initial encounter for closed fracture: Secondary | ICD-10-CM | POA: Diagnosis present

## 2017-08-23 DIAGNOSIS — J939 Pneumothorax, unspecified: Secondary | ICD-10-CM | POA: Diagnosis present

## 2017-08-23 DIAGNOSIS — Z808 Family history of malignant neoplasm of other organs or systems: Secondary | ICD-10-CM | POA: Diagnosis not present

## 2017-08-23 DIAGNOSIS — J9600 Acute respiratory failure, unspecified whether with hypoxia or hypercapnia: Secondary | ICD-10-CM | POA: Diagnosis present

## 2017-08-23 DIAGNOSIS — Z8501 Personal history of malignant neoplasm of esophagus: Secondary | ICD-10-CM

## 2017-08-23 DIAGNOSIS — S329XXA Fracture of unspecified parts of lumbosacral spine and pelvis, initial encounter for closed fracture: Secondary | ICD-10-CM | POA: Diagnosis not present

## 2017-08-23 DIAGNOSIS — J9811 Atelectasis: Secondary | ICD-10-CM | POA: Diagnosis not present

## 2017-08-23 DIAGNOSIS — M8580 Other specified disorders of bone density and structure, unspecified site: Secondary | ICD-10-CM | POA: Diagnosis present

## 2017-08-23 DIAGNOSIS — Z681 Body mass index (BMI) 19 or less, adult: Secondary | ICD-10-CM | POA: Diagnosis not present

## 2017-08-23 DIAGNOSIS — J9312 Secondary spontaneous pneumothorax: Secondary | ICD-10-CM | POA: Diagnosis not present

## 2017-08-23 DIAGNOSIS — C799 Secondary malignant neoplasm of unspecified site: Secondary | ICD-10-CM | POA: Diagnosis not present

## 2017-08-23 DIAGNOSIS — J9601 Acute respiratory failure with hypoxia: Secondary | ICD-10-CM | POA: Diagnosis present

## 2017-08-23 DIAGNOSIS — C159 Malignant neoplasm of esophagus, unspecified: Secondary | ICD-10-CM | POA: Diagnosis not present

## 2017-08-23 DIAGNOSIS — E46 Unspecified protein-calorie malnutrition: Secondary | ICD-10-CM | POA: Diagnosis not present

## 2017-08-23 DIAGNOSIS — S300XXA Contusion of lower back and pelvis, initial encounter: Secondary | ICD-10-CM | POA: Diagnosis present

## 2017-08-23 DIAGNOSIS — Z87891 Personal history of nicotine dependence: Secondary | ICD-10-CM

## 2017-08-23 DIAGNOSIS — D509 Iron deficiency anemia, unspecified: Secondary | ICD-10-CM | POA: Diagnosis present

## 2017-08-23 DIAGNOSIS — J9383 Other pneumothorax: Secondary | ICD-10-CM | POA: Diagnosis not present

## 2017-08-23 DIAGNOSIS — C787 Secondary malignant neoplasm of liver and intrahepatic bile duct: Secondary | ICD-10-CM | POA: Diagnosis present

## 2017-08-23 DIAGNOSIS — W1830XA Fall on same level, unspecified, initial encounter: Secondary | ICD-10-CM | POA: Diagnosis present

## 2017-08-23 DIAGNOSIS — R55 Syncope and collapse: Secondary | ICD-10-CM | POA: Diagnosis present

## 2017-08-23 DIAGNOSIS — S32811A Multiple fractures of pelvis with unstable disruption of pelvic ring, initial encounter for closed fracture: Secondary | ICD-10-CM | POA: Diagnosis present

## 2017-08-23 DIAGNOSIS — J90 Pleural effusion, not elsewhere classified: Secondary | ICD-10-CM | POA: Diagnosis not present

## 2017-08-23 DIAGNOSIS — F4322 Adjustment disorder with anxiety: Secondary | ICD-10-CM | POA: Diagnosis present

## 2017-08-23 HISTORY — DX: Anemia, unspecified: D64.9

## 2017-08-23 LAB — URINALYSIS, COMPLETE (UACMP) WITH MICROSCOPIC
BACTERIA UA: NONE SEEN
BILIRUBIN URINE: NEGATIVE
Glucose, UA: NEGATIVE mg/dL
HGB URINE DIPSTICK: NEGATIVE
KETONES UR: NEGATIVE mg/dL
Leukocytes, UA: NEGATIVE
NITRITE: NEGATIVE
PH: 5 (ref 5.0–8.0)
Protein, ur: 30 mg/dL — AB
SPECIFIC GRAVITY, URINE: 1.023 (ref 1.005–1.030)
Squamous Epithelial / LPF: NONE SEEN

## 2017-08-23 LAB — TYPE AND SCREEN
ABO/RH(D): AB POS
ANTIBODY SCREEN: NEGATIVE

## 2017-08-23 LAB — CBC WITH DIFFERENTIAL/PLATELET
BASOS PCT: 1 %
Basophils Absolute: 0.1 10*3/uL (ref 0–0.1)
EOS ABS: 0.2 10*3/uL (ref 0–0.7)
EOS PCT: 1 %
HEMATOCRIT: 26.1 % — AB (ref 40.0–52.0)
HEMOGLOBIN: 8.4 g/dL — AB (ref 13.0–18.0)
LYMPHS ABS: 1.1 10*3/uL (ref 1.0–3.6)
Lymphocytes Relative: 10 %
MCH: 28.5 pg (ref 26.0–34.0)
MCHC: 32.3 g/dL (ref 32.0–36.0)
MCV: 88.1 fL (ref 80.0–100.0)
Monocytes Absolute: 1 10*3/uL (ref 0.2–1.0)
Monocytes Relative: 9 %
NEUTROS PCT: 79 %
Neutro Abs: 8.8 10*3/uL — ABNORMAL HIGH (ref 1.4–6.5)
Platelets: 360 10*3/uL (ref 150–440)
RBC: 2.96 MIL/uL — AB (ref 4.40–5.90)
RDW: 19.3 % — ABNORMAL HIGH (ref 11.5–14.5)
WBC: 11.2 10*3/uL — AB (ref 3.8–10.6)

## 2017-08-23 LAB — PROTIME-INR
INR: 1.03
Prothrombin Time: 13.4 seconds (ref 11.4–15.2)

## 2017-08-23 LAB — COMPREHENSIVE METABOLIC PANEL
ALK PHOS: 112 U/L (ref 38–126)
ALT: 15 U/L — AB (ref 17–63)
ANION GAP: 10 (ref 5–15)
AST: 32 U/L (ref 15–41)
Albumin: 2.2 g/dL — ABNORMAL LOW (ref 3.5–5.0)
BILIRUBIN TOTAL: 0.5 mg/dL (ref 0.3–1.2)
BUN: 17 mg/dL (ref 6–20)
CALCIUM: 8.1 mg/dL — AB (ref 8.9–10.3)
CO2: 23 mmol/L (ref 22–32)
CREATININE: 0.61 mg/dL (ref 0.61–1.24)
Chloride: 100 mmol/L — ABNORMAL LOW (ref 101–111)
Glucose, Bld: 192 mg/dL — ABNORMAL HIGH (ref 65–99)
Potassium: 4 mmol/L (ref 3.5–5.1)
SODIUM: 133 mmol/L — AB (ref 135–145)
TOTAL PROTEIN: 4.6 g/dL — AB (ref 6.5–8.1)

## 2017-08-23 LAB — TROPONIN I

## 2017-08-23 LAB — LACTIC ACID, PLASMA: Lactic Acid, Venous: 3 mmol/L (ref 0.5–1.9)

## 2017-08-23 MED ORDER — ALPRAZOLAM 0.25 MG PO TABS: 0.2500 mg | ORAL_TABLET | Freq: Three times a day (TID) | ORAL | 0 refills | Status: AC | PRN

## 2017-08-23 MED ORDER — HEPARIN SOD (PORK) LOCK FLUSH 100 UNIT/ML IV SOLN
500.0000 [IU] | Freq: Once | INTRAVENOUS | Status: AC
  Administered 2017-08-23: 500 [IU] via INTRAVENOUS
  Filled 2017-08-23: qty 5

## 2017-08-23 MED ORDER — FENTANYL CITRATE (PF) 100 MCG/2ML IJ SOLN
100.0000 ug | Freq: Once | INTRAMUSCULAR | Status: AC
  Administered 2017-08-23: 100 ug via INTRAVENOUS

## 2017-08-23 MED ORDER — HYDROCODONE-ACETAMINOPHEN 5-325 MG PO TABS: 1.0000 | ORAL_TABLET | ORAL | 0 refills | Status: DC | PRN

## 2017-08-23 MED ORDER — POLYETHYLENE GLYCOL 3350 17 G PO PACK: 17.0000 g | PACK | Freq: Every day | ORAL | 0 refills | Status: AC | PRN

## 2017-08-23 MED ORDER — LIDOCAINE HCL (PF) 1 % IJ SOLN
INTRAMUSCULAR | Status: AC
  Administered 2017-08-23: 23:00:00
  Filled 2017-08-23: qty 5

## 2017-08-23 MED ORDER — MORPHINE SULFATE ER 30 MG PO TBCR: 30.0000 mg | EXTENDED_RELEASE_TABLET | Freq: Two times a day (BID) | ORAL | 0 refills | Status: DC

## 2017-08-23 MED ORDER — FENTANYL CITRATE (PF) 100 MCG/2ML IJ SOLN
INTRAMUSCULAR | Status: AC
  Filled 2017-08-23: qty 2

## 2017-08-23 MED ORDER — DEXTROSE 5 % IV SOLN
2.0000 g | Freq: Once | INTRAVENOUS | Status: AC
  Administered 2017-08-23: 2 g via INTRAVENOUS
  Filled 2017-08-23: qty 2

## 2017-08-23 MED ORDER — ACETAMINOPHEN 325 MG PO TABS: 650.0000 mg | ORAL_TABLET | Freq: Four times a day (QID) | ORAL | 0 refills | Status: AC | PRN

## 2017-08-23 MED ORDER — SODIUM CHLORIDE 0.9 % IV BOLUS (SEPSIS)
1000.0000 mL | Freq: Once | INTRAVENOUS | Status: AC
  Administered 2017-08-23: 1000 mL via INTRAVENOUS

## 2017-08-23 NOTE — ED Triage Notes (Signed)
Pt brought in by [facility] from [facility].  [place] stated they felt like pt was having allergic reaction, but no signs of reaction.  Pt was released from hospital today from inpatient unit.  Pt pale with L arm swollen.  Pt on 15L NRB per EMS and O2 at 94%.  Pt unable to tolerate being off of O2 at this time.  Pt has hx of cancer.  Pt reports that he is a DNR, despite no golden form being included in facility packet.  Pt is A&Ox4.

## 2017-08-23 NOTE — Progress Notes (Signed)
Report Called to Magda Paganini LPN at [facility]

## 2017-08-23 NOTE — NC FL2 (Signed)
[place] LEVEL OF CARE SCREENING TOOL     IDENTIFICATION  Patient Name: Larry Wood Birthdate: September 19, 1946 Sex: male Admission Date (Current Location): 08/21/2017  [place] and [place] Number:  Engineering geologist and Address:  [facility], [address], [place], [place] [address]      Provider Number: [id number]  Attending Physician Name and Address:  Bettey Costa, MD  Relative Name and Phone Number:  Spouse Elishah Ashmore [phone]    Current Level of Care: Hospital Recommended Level of Care: [place] Prior Approval Number:    Date Approved/Denied:   PASRR Number: [id number]  Discharge Plan: SNF    Current Diagnoses: Patient Active Problem List   Diagnosis Date Noted  . Multiple closed pelvic fractures with disruption of pelvic circle ([place]) 08/21/2017  . Cachexia ([place]) 05/15/2017  . Encounter for antineoplastic chemotherapy 04/12/2017  . Neoplasm related pain 04/12/2017  . Iron deficiency anemia due to chronic blood loss 03/27/2017  . Liver metastases ([place]) 03/27/2017  . Goals of care, counseling/discussion 03/27/2017  . 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 Esophageal cancer, stage IV ([place]) 03/19/2017  . Epigastric abdominal tenderness 03/10/2017  . Gastric mass 03/10/2017  . Hyponatremia 03/09/2017    Orientation RESPIRATION BLADDER Height & Weight     Self, Time, Situation, Place  Normal Continent Weight: 130 lb (59 kg) Height:  5\' 10"  (177.8 cm)  BEHAVIORAL SYMPTOMS/MOOD NEUROLOGICAL BOWEL NUTRITION STATUS      Continent Diet  AMBULATORY STATUS COMMUNICATION OF NEEDS Skin   Extensive Assist Verbally Normal                       Personal Care Assistance Level of Assistance  Bathing, Feeding, Dressing Bathing Assistance: Limited assistance Feeding assistance: Independent Dressing Assistance: Limited assistance     Functional Limitations Info  Sight, Hearing,  Speech Sight Info: Adequate Hearing Info: Adequate Speech Info: Adequate    SPECIAL CARE FACTORS FREQUENCY  PT (By licensed PT)     PT Frequency: 5x              Contractures Contractures Info: Not present    Additional Factors Info  Code Status, Allergies Code Status Info: Full Allergies Info: No Known Allergies           Current Medications (08/23/2017):  This is the current hospital active medication list Current Facility-Administered Medications  Medication Dose Route Frequency Provider Last Rate Last Dose  . 0.9 %  sodium chloride infusion   Intravenous Continuous Salary, Holly Bodily D, MD 75 mL/hr at 08/22/17 2229    . acetaminophen (TYLENOL) tablet 650 mg  650 mg Oral Q6H PRN Salary, Montell D, MD       Or  . acetaminophen (TYLENOL) suppository 650 mg  650 mg Rectal Q6H PRN Salary, Montell D, MD      . ALPRAZolam Duanne Moron) tablet 0.25 mg  0.25 mg Oral TID PRN Gladstone Lighter, MD      . enoxaparin (LOVENOX) injection 40 mg  40 mg Subcutaneous Q24H Gladstone Lighter, MD   40 mg at 08/22/17 1715  . HYDROcodone-acetaminophen (NORCO/VICODIN) 5-325 MG per tablet 1-2 tablet  1-2 tablet Oral Q4H PRN Gladstone Lighter, MD      . morphine (MS CONTIN) 12 hr tablet 30 mg  30 mg Oral Q12H Salary, Montell D, MD   30 mg at 08/23/17 0929  . morphine 2 MG/ML injection 2 mg  2 mg Intravenous Q1H PRN  Salary, Avel Peace, MD   2 mg at 08/22/17 1758  . ondansetron (ZOFRAN) tablet 4 mg  4 mg Oral Q6H PRN Salary, Montell D, MD       Or  . ondansetron (ZOFRAN) injection 4 mg  4 mg Intravenous Q6H PRN Salary, Montell D, MD      . pantoprazole (PROTONIX) EC tablet 40 mg  40 mg Oral BID Loney Hering D, MD   40 mg at 08/23/17 0929  . polyethylene glycol (MIRALAX / GLYCOLAX) packet 17 g  17 g Oral Daily PRN Salary, Montell D, MD      . promethazine (PHENERGAN) tablet 12.5 mg  12.5 mg Oral QID PRN Salary, Montell D, MD      . sodium chloride flush (NS) 0.9 % injection 3 mL  3 mL Intravenous  Q12H Salary, Montell D, MD   3 mL at 08/23/17 4356   Facility-Administered Medications Ordered in Other Encounters  Medication Dose Route Frequency Provider Last Rate Last Dose  . sodium chloride flush (NS) 0.9 % injection 3 mL  3 mL Intracatheter PRN Sindy Guadeloupe, MD         Discharge Medications: Please see discharge summary for a list of discharge medications.  Relevant Imaging Results:  Relevant Lab Results:   Additional Information SSN: [id number]  Truitt Merle, LCSW

## 2017-08-23 NOTE — Discharge Summary (Signed)
[facility] NAME: Larry Wood    MR#:  [id number]  DATE OF BIRTH:  July 12, 1946  DATE OF ADMISSION:  08/21/2017 ADMITTING PHYSICIAN: Gorden Harms, MD  DATE OF DISCHARGE: 08/23/2017  PRIMARY CARE PHYSICIAN: Derinda Late, MD    ADMISSION DIAGNOSIS:  Syncope and collapse [R55] Chronic hyponatremia [E87.1] Minor head injury, initial encounter [S09.90XA] Closed posterior wall acetabular fx, left, initial encounter ([place]) [S32.422A] Lumbar contusion, initial encounter [S30.0XXA] Closed fracture of right inferior pubic ramus, initial encounter ([place]) [S32.591A] Closed fracture of right superior pubic ramus, initial encounter ([place]) [S32.511A] Anemia, unspecified type [D64.9] Closed fracture of sacrum, unspecified fracture morphology, initial encounter ([place]) [S32.10XA]  DISCHARGE DIAGNOSIS:  Active Problems:   Multiple closed pelvic fractures with disruption of pelvic circle ([place])   SECONDARY DIAGNOSIS:   Past Medical History:  Diagnosis Date  . Cancer ([place])   . 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 Patient was seen and evaluated by orthopedic surgery as well as physical therapy. This is a nonoperable fracture. Patient will continue with physical therapy and pain medications.  Patient can follow up with Dr. Sabra Heck in 2-4 weeks.   2. Vasovagal syncope: Echocardiogram shows normal ejection fraction with LVH. Carotid Dopplers with no evident significant stenosis. CT of the head showing chronic changes  but no acute findings.   3. stage IV gastroesophageal junction carcinoma: Patient was seen and evaluated by oncology. He would benefit from palliative care/hospice at skilled nursing facility.  She will follow up with oncology in 1-2 weeks.   4. COPD: Patient had no signs of exacerbation at this time.  5. Acute grief reaction: Patient is emotional due to his wife passing away last night. Continue PRN Xanax.     DISCHARGE CONDITIONS AND DIET:  Stable regular diet  CONSULTS OBTAINED:  Treatment Team:  Sindy Guadeloupe, MD  DRUG ALLERGIES:  No Known Allergies  DISCHARGE MEDICATIONS:   Current Discharge Medication List    START taking these medications   Details  acetaminophen (TYLENOL) 325 MG tablet Take 2 tablets (650 mg total) by mouth every 6 (six) hours as needed for mild pain (or Fever >/= 101). Qty: 30 tablet, Refills: 0    ALPRAZolam (XANAX) 0.25 MG tablet Take 1 tablet (0.25 mg total) by mouth 3 (three) times daily as needed for anxiety. Qty: 20 tablet, Refills: 0    HYDROcodone-acetaminophen (NORCO/VICODIN) 5-325 MG tablet Take 1-2 tablets by mouth every 4 (four) hours as needed for moderate pain. Qty: 30 tablet, Refills: 0    polyethylene glycol (MIRALAX / GLYCOLAX) packet Take 17 g by mouth daily as needed for mild constipation. Qty: 14 each, Refills: 0      CONTINUE these medications which have CHANGED   Details  morphine (MS CONTIN) 30 MG 12 hr tablet Take 1 tablet (30 mg total) by mouth every 12 (twelve) hours. Qty: 20 tablet, Refills: 0      CONTINUE these medications which have NOT CHANGED   Details  pantoprazole (PROTONIX) 40 MG tablet Take 1 tablet (40 mg total) by mouth 2 (two) times  daily. Qty: 60 tablet, Refills: 2   Associated Diagnoses: Metastasis from esophageal cancer (HCC)      STOP taking these medications     promethazine (PHENERGAN) 12.5 MG tablet      furosemide (LASIX) 20 MG tablet      megestrol (MEGACE) 40 MG tablet       prochlorperazine (COMPAZINE) 10 MG tablet           Today   CHIEF COMPLAINT:  Pain is better controlled  Tearful this am   VITAL SIGNS:  Blood pressure (!) 125/57, pulse 83, temperature 97.8 F (36.6 C), temperature source Oral, resp. rate 18, height 5\' 10"  (1.778 m), weight 59 kg (130 lb), SpO2 95 %.   REVIEW OF SYSTEMS:  Review of Systems  Constitutional: Positive for malaise/fatigue. Negative for chills and fever.  HENT: Negative.  Negative for ear discharge, ear pain, hearing loss, nosebleeds and sore throat.   Eyes: Negative.  Negative for blurred vision and pain.  Respiratory: Negative.  Negative for cough, hemoptysis, shortness of breath and wheezing.   Cardiovascular: Negative.  Negative for chest pain, palpitations and leg swelling.  Gastrointestinal: Negative.  Negative for abdominal pain, blood in stool, diarrhea, nausea and vomiting.  Genitourinary: Negative.  Negative for dysuria.  Musculoskeletal: Positive for falls and joint pain. Negative for back pain.  Skin: Negative.   Neurological: Positive for weakness. Negative for dizziness, tremors, speech change, focal weakness, seizures and headaches.  Endo/Heme/Allergies: Negative.  Does not bruise/bleed easily.  Psychiatric/Behavioral: Positive for depression. Negative for hallucinations and suicidal ideas. The patient is nervous/anxious. The patient does not have insomnia.      PHYSICAL EXAMINATION:  GENERAL:  71 y.o.-year-old patient lying in the bed with no acute distress. Thin frail NECK:  Supple, no jugular venous distention. No thyroid enlargement, no tenderness.  LUNGS: Normal breath sounds bilaterally, no wheezing, rales,rhonchi  No use of accessory muscles of respiration.  CARDIOVASCULAR: S1, S2 normal. No murmurs, rubs, or gallops.  ABDOMEN: Soft, non-tender, non-distended. Bowel sounds present. No organomegaly or mass.  EXTREMITIES: No pedal edema, cyanosis, or clubbing.  PSYCHIATRIC: The patient is  alert and oriented x 3.  tearful SKIN: No obvious rash, lesion, or ulcer.   DATA REVIEW:   CBC Recent Labs  Lab 08/21/17 1125  WBC 9.0  HGB 9.3*  HCT 28.4*  PLT 262    Chemistries  Recent Labs  Lab 08/21/17 1125  NA 130*  K 4.1  CL 98*  CO2 26  GLUCOSE 95  BUN 13  CREATININE 0.57*  CALCIUM 8.0*    Cardiac Enzymes Recent Labs  Lab 08/21/17 2210 08/22/17 0429 08/22/17 1240  TROPONINI <0.03 <0.03 <0.03    Microbiology Results  @[email or web address] @  RADIOLOGY:  Dg Lumbar Spine 2-3 Views  Result Date: 08/21/2017 CLINICAL DATA:  Pain following fall EXAM: LUMBAR SPINE - 2-3 VIEW COMPARISON:  None. FINDINGS: Frontal, lateral, and spot lumbosacral lateral images were obtained. There are 5 non-rib-bearing lumbar type vertebral bodies. There is no demonstrable fracture or spondylolisthesis. There is moderately severe disc space narrowing at L5-S1. Other disc spaces appear unremarkable. No erosive change. There is aortoiliac atherosclerosis. IMPRESSION: Moderately severe disc space narrowing at L5-S1. Other disc spaces appear unremarkable. No acute fracture or spondylolisthesis. There is aortoiliac atherosclerosis. Aortic Atherosclerosis (ICD10-I70.0). Electronically Signed   By: Lowella Grip III M.D.   On: 08/21/2017 11:40   Ct Head Wo Contrast  Result Date: 08/21/2017 CLINICAL DATA:  Unwitnessed fall. EXAM: CT HEAD WITHOUT CONTRAST  TECHNIQUE: Contiguous axial images were obtained from the base of the skull through the vertex without intravenous contrast. COMPARISON:  None. FINDINGS: Brain: No evidence of acute infarction, hemorrhage, hydrocephalus, extra-axial collection or mass lesion/mass effect. Mild to moderate age related cerebral atrophy. Vascular: Atherosclerotic vascular calcification of the carotid siphons. No hyperdense vessel. Skull: Normal. Negative for fracture or focal lesion. Sinuses/Orbits: Minimal partial opacification of the left mastoid air cells. Otherwise,  no acute finding. Other: None. IMPRESSION: No acute intracranial abnormality. Electronically Signed   By: Titus Dubin M.D.   On: 08/21/2017 12:07   Ct Pelvis Wo Contrast  Result Date: 08/21/2017 CLINICAL DATA:  Patient found on floor without loss of consciousness. Right groin pain with movement. EXAM: CT PELVIS WITHOUT CONTRAST TECHNIQUE: Multidetector CT imaging of the pelvis was performed following the standard protocol without intravenous contrast. COMPARISON:  06/29/2017 CT FINDINGS: Urinary Tract: No hydroureteronephrosis. Physiologically distended urinary bladder without calculus or mass. No focal mural thickening. Bowel: No bowel obstruction. Colonic diverticulosis with circular muscle hypertrophy along the sigmoid colon. Vascular/Lymphatic: Moderate to marked atherosclerosis of the included distal abdominal aorta, common iliacs and branch vessels. No lymphadenopathy identified. Reproductive:  Normal size prostate. Other: Hypodense masses of the included right hepatic lobe consistent with known metastatic disease. Moderate ascites and diffuse soft tissue anasarca. Musculoskeletal: Acute left sacral alar fracture without intra-articular extension, series 2, image 40 through 45. Acute right superior and inferior pubic rami fractures with minimal displacement of the superior pubic ramus fracture near the pubic symphysis. Acute, nondisplaced anterior wall fracture of the left acetabulum. Osteoarthritic joint space narrowing of both hips. No joint dislocation. The bones are demineralized in appearance and there are more focal areas of indistinct osteopenia involving the bony pelvis in particular the right iliac bone, series 6, image 22 an about both acetabular roofs. These may represent subtle lytic lesions. The included lower lumbar spine demonstrates degenerative disc and facet arthropathy. IMPRESSION: 1. Acute right superior and inferior pubic rami, left sacral alar and left anterior acetabular wall  fractures are noted, minimally displaced involving the right superior pubic ramus fracture, otherwise without significant displacement nor joint dislocations. No proximal femoral fracture. 2. Osteopenic appearance of the bony pelvis with more focal areas of osteopenia in particular the right iliac bone. Early changes of lytic metastasis is not excluded. 3. Re- demonstration of hepatic metastasis of the included right hepatic lobe. 4. Diffuse soft tissue anasarca and ascites. 5. Colonic diverticulosis without acute diverticulitis. 6. Aortoiliac and branch vessel atherosclerosis. Electronically Signed   By: Ashley Royalty M.D.   On: 08/21/2017 14:39   US Carotid Bilateral  Result Date: 08/22/2017 CLINICAL DATA:  Syncope and collapse EXAM: BILATERAL CAROTID DUPLEX ULTRASOUND TECHNIQUE: Pearline Cables scale imaging, color Doppler and duplex ultrasound were performed of bilateral carotid and vertebral arteries in the neck. COMPARISON:  None. FINDINGS: Criteria: Quantification of carotid stenosis is based on velocity parameters that correlate the residual internal carotid diameter with NASCET-based stenosis levels, using the diameter of the distal internal carotid lumen as the denominator for stenosis measurement. The following velocity measurements were obtained: RIGHT ICA:  79 cm/sec CCA:  86 cm/sec SYSTOLIC ICA/CCA RATIO:  1.4 DIASTOLIC ICA/CCA RATIO:  2.2 ECA:  123 cm/sec LEFT ICA:  62 cm/sec CCA:  69 cm/sec SYSTOLIC ICA/CCA RATIO:  0.9 DIASTOLIC ICA/CCA RATIO:  1.3 ECA:  90 cm/sec RIGHT CAROTID ARTERY: Mild irregular calcified plaque in the bulb. Low resistance internal carotid Doppler pattern. RIGHT VERTEBRAL ARTERY:  Antegrade. LEFT CAROTID ARTERY:  Mild irregular calcified plaque in the bulb. Low resistance internal carotid Doppler pattern is preserved. LEFT VERTEBRAL ARTERY:  Antegrade. IMPRESSION: Less than 50% stenosis in the right and left internal carotid arteries. Electronically Signed   By: Marybelle Killings M.D.   On:  08/22/2017 12:30   Dg Hip Unilat W Or Wo Pelvis 2-3 Views Left  Result Date: 08/21/2017 CLINICAL DATA:  Fall.  Groin pain. EXAM: DG HIP (WITH OR WITHOUT PELVIS) 2-3V LEFT COMPARISON:  CT 06/29/2017. FINDINGS: Diffuse osteopenia degenerative change. No acute bony or joint abnormality identified. No evidence of fracture dislocation. Aortoiliac and peripheral vascular calcification. IMPRESSION: 1. Diffuse osteopenia degenerative change. 2.  Aortoiliac and peripheral vascular disease. Electronically Signed   By: Marcello Moores  Register   On: 08/21/2017 11:40   Dg Hip Unilat W Or Wo Pelvis 2-3 Views Right  Result Date: 08/21/2017 CLINICAL DATA:  Fall.  Groin pain.  Back pain. EXAM: DG HIP (WITH OR WITHOUT PELVIS) 2-3V RIGHT COMPARISON:  CT 06/29/2017. FINDINGS: Diffuse osteopenia and degenerative change. No acute bony or joint abnormality identified. No evidence fracture or dislocation. Peripheral vascular calcification. IMPRESSION: 1.  Diffuse osteopenia degenerative change.  No acute abnormality. 2. Peripheral vascular disease . Electronically Signed   By: Marcello Moores  Register   On: 08/21/2017 11:39      Current Discharge Medication List    START taking these medications   Details  acetaminophen (TYLENOL) 325 MG tablet Take 2 tablets (650 mg total) by mouth every 6 (six) hours as needed for mild pain (or Fever >/= 101). Qty: 30 tablet, Refills: 0    ALPRAZolam (XANAX) 0.25 MG tablet Take 1 tablet (0.25 mg total) by mouth 3 (three) times daily as needed for anxiety. Qty: 20 tablet, Refills: 0    HYDROcodone-acetaminophen (NORCO/VICODIN) 5-325 MG tablet Take 1-2 tablets by mouth every 4 (four) hours as needed for moderate pain. Qty: 30 tablet, Refills: 0    polyethylene glycol (MIRALAX / GLYCOLAX) packet Take 17 g by mouth daily as needed for mild constipation. Qty: 14 each, Refills: 0      CONTINUE these medications which have CHANGED   Details  morphine (MS CONTIN) 30 MG 12 hr tablet Take 1  tablet (30 mg total) by mouth every 12 (twelve) hours. Qty: 20 tablet, Refills: 0      CONTINUE these medications which have NOT CHANGED   Details  pantoprazole (PROTONIX) 40 MG tablet Take 1 tablet (40 mg total) by mouth 2 (two) times daily. Qty: 60 tablet, Refills: 2   Associated Diagnoses: Metastasis from esophageal cancer (HCC)      STOP taking these medications     promethazine (PHENERGAN) 12.5 MG tablet      furosemide (LASIX) 20 MG tablet      megestrol (MEGACE) 40 MG tablet      prochlorperazine (COMPAZINE) 10 MG tablet            Management plans discussed with the patient and he is in agreement. Stable for discharge SNF with palliative care follow-up  Patient should follow up with PCP and oncology Palliative care  CODE STATUS:     Code Status Orders  (From admission, onward)        Start     Ordered   08/21/17 2027  Do not attempt resuscitation (DNR)  Continuous    Question Answer Comment  In the event of cardiac or respiratory ARREST Do not call a "code blue"   In the event of cardiac or respiratory ARREST  Do not perform Intubation, CPR, defibrillation or ACLS   In the event of cardiac or respiratory ARREST Use medication by any route, position, wound care, and other measures to relive pain and suffering. May use oxygen, suction and manual treatment of airway obstruction as needed for comfort.      08/21/17 2026    Code Status History    Date Active Date Inactive Code Status Order ID Comments User Context   07/03/2017 11:27 08/17/2017 08:33 DNR [id number]  Sindy Guadeloupe, MD Outpatient   03/09/2017 15:02 03/11/2017 18:41 Full Code [id number]  Nicholes Mango, MD Inpatient      TOTAL TIME TAKING CARE OF THIS PATIENT: 38 minutes.    Note: This dictation was prepared with Dragon dictation along with smaller phrase technology. Any transcriptional errors that result from this process are unintentional.  Dotsie Gillette M.D on 08/23/2017 at 10:33 AM  Between 7am  to 6pm - Pager - [phone] After 6pm go to [email or web address] - password [email or web address] [facility]  Office  [phone]  CC: Primary care physician; Derinda Late, MD

## 2017-08-23 NOTE — ED Provider Notes (Signed)
[facility] Emergency Department Provider Note ____________________________________________   First MD Initiated Contact with Patient 08/23/17 2138     (approximate)  I have reviewed the triage vital signs and the nursing notes.   HISTORY  Chief Complaint Respiratory Distress  History of present illness limited by dyspnea and weakness.  HPI REYNOLDO Wood is a 71 y.o. male with past medical history as noted below including stage IV esophageal cancer and recent admission for syncope and nonoperative pelvic fractures, who presents with respiratory distress, acute onset when he was in the rehab facility, occurring after he was administered Norvasc, and associated with increased generalized weakness.  EMS stated that the facility staff for concern for an allergic reaction, however the on their assessment they did not see any signs of allergic reaction so patient did not receive any specific treatment en route except for oxygen.  Patient was hypoxic to approximately 90 on their arrival, and the O2 sat was in the low to mid 90s on 15 L by nonrebreather.  Patient reports feeling short of breath and states that he has chest pain.  He is unable to give any further history.   Past Medical History:  Diagnosis Date  . Anemia   . Cancer ([place])   . COPD (chronic obstructive pulmonary disease) [facility])     Patient Active Problem List   Diagnosis Date Noted  . Multiple closed pelvic fractures with disruption of pelvic circle ([place]) 08/21/2017  . Cachexia ([place]) 05/15/2017  . Encounter for antineoplastic chemotherapy 04/12/2017  . Neoplasm related pain 04/12/2017  . Iron deficiency anemia due to chronic blood loss 03/27/2017  . Liver metastases ([place]) 03/27/2017  . Goals of care, counseling/discussion 03/27/2017  . 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 Esophageal cancer, stage IV ([place]) 03/19/2017  . Epigastric abdominal tenderness 03/10/2017  . Gastric  mass 03/10/2017  . Hyponatremia 03/09/2017    Past Surgical History:  Procedure Laterality Date  . ESOPHAGOGASTRODUODENOSCOPY (EGD) WITH PROPOFOL N/A 03/10/2017   Procedure: ESOPHAGOGASTRODUODENOSCOPY (EGD) WITH PROPOFOL;  Surgeon: Wilford Corner, MD;  Location: [facility] ENDOSCOPY;  Service: Endoscopy;  Laterality: N/A;  . ESOPHAGOGASTRODUODENOSCOPY (EGD) WITH PROPOFOL N/A 08/17/2017   Procedure: ESOPHAGOGASTRODUODENOSCOPY (EGD) WITH PROPOFOL WITH HEMOSPRAY;  Surgeon: Jonathon Bellows, MD;  Location: [facility] ENDOSCOPY;  Service: Gastroenterology;  Laterality: N/A;  . KNEE ARTHROSCOPY  1991  . PORTA CATH INSERTION N/A 03/21/2017   Procedure: Glori Luis Cath Insertion;  Surgeon: Algernon Huxley, MD;  Location: [place] CV LAB;  Service: Cardiovascular;  Laterality: N/A;  . TONSILLECTOMY      Prior to Admission medications   Medication Sig Start Date End Date Taking? Authorizing Provider  acetaminophen (TYLENOL) 325 MG tablet Take 2 tablets (650 mg total) by mouth every 6 (six) hours as needed for mild pain (or Fever >/= 101). 08/23/17  Yes Bettey Costa, MD  ALPRAZolam (XANAX) 0.25 MG tablet Take 1 tablet (0.25 mg total) by mouth 3 (three) times daily as needed for anxiety. 08/23/17  Yes Bettey Costa, MD  HYDROcodone-acetaminophen (NORCO/VICODIN) 5-325 MG tablet Take 1-2 tablets by mouth every 4 (four) hours as needed for moderate pain. 08/23/17  Yes Mody, Ulice Bold, MD  morphine (MS CONTIN) 30 MG 12 hr tablet Take 1 tablet (30 mg total) by mouth every 12 (twelve) hours. 08/23/17  Yes Mody, Ulice Bold, MD  pantoprazole (PROTONIX) 40 MG tablet Take 1 tablet (40 mg total) by mouth 2 (two) times daily. 07/12/17 07/12/18 Yes Sindy Guadeloupe, MD  polyethylene glycol (  MIRALAX / GLYCOLAX) packet Take 17 g by mouth daily as needed for mild constipation. 08/23/17  Yes Bettey Costa, MD    Allergies Patient has no known allergies.  No family history on file.  Social History Social History   Tobacco Use  . Smoking status:  Former Smoker    Last attempt to quit: 06/2010    Years since quitting: 7.2  . Smokeless tobacco: Never Used  Substance Use Topics  . Alcohol use: No  . Drug use: No    Review of Systems Level V caveat: Unable to obtain review of systems due to dyspnea and weakness.    ____________________________________________   PHYSICAL EXAM:  VITAL SIGNS: ED Triage Vitals  Enc Vitals Group     BP 08/23/17 2142 (!) 158/85     Pulse Rate 08/23/17 2142 (!) 137     Resp 08/23/17 2142 (!) 22     Temp 08/23/17 2152 97.8 F (36.6 C)     Temp Source 08/23/17 2152 Rectal     SpO2 08/23/17 2142 94 %     Weight 08/23/17 2144 130 lb (59 kg)     Height --      Head Circumference --      Peak Flow --      Pain Score --      Pain Loc --      Pain Edu? --      Excl. in [place]? --     Constitutional: Alert, pale and ill-appearing. Eyes: Conjunctivae are normal.  EOMI.  PERRLA. Head: Atraumatic. Nose: No congestion/rhinnorhea. Mouth/Throat: Mucous membranes are dry.   Neck: Normal range of motion.  Cardiovascular: Tachycardic, regular rhythm. Grossly normal heart sounds.  Good peripheral circulation. Respiratory: Increased respiratory effort.  No retractions.  Decreased breath sounds to bilateral lower lungs. Gastrointestinal: Soft and nontender. No distention.  Genitourinary: No flank tenderness. Musculoskeletal: No lower extremity edema.  Extremities warm and well perfused.  Neurologic: Motor intact in all extremities.  No gross focal neurologic deficits are appreciated.  Skin: Pale.  Skin is warm and dry. No rash noted. Psychiatric: Speech and behavior are normal.  ____________________________________________   LABS (all labs ordered are listed, but only abnormal results are displayed)  Labs Reviewed  COMPREHENSIVE METABOLIC PANEL - Abnormal; Notable for the following components:      Result Value   Sodium 133 (*)    Chloride 100 (*)    Glucose, Bld 192 (*)    Calcium 8.1 (*)     Total Protein 4.6 (*)    Albumin 2.2 (*)    ALT 15 (*)    All other components within normal limits  LACTIC ACID, PLASMA - Abnormal; Notable for the following components:   Lactic Acid, Venous 3.0 (*)    All other components within normal limits  CBC WITH DIFFERENTIAL/PLATELET - Abnormal; Notable for the following components:   WBC 11.2 (*)    RBC 2.96 (*)    Hemoglobin 8.4 (*)    HCT 26.1 (*)    RDW 19.3 (*)    Neutro Abs 8.8 (*)    All other components within normal limits  URINALYSIS, COMPLETE (UACMP) WITH MICROSCOPIC - Abnormal; Notable for the following components:   Color, Urine AMBER (*)    APPearance HAZY (*)    Protein, ur 30 (*)    All other components within normal limits  CULTURE, BLOOD (ROUTINE X 2)  CULTURE, BLOOD (ROUTINE X 2)  PROTIME-INR  TROPONIN I  LACTIC ACID, PLASMA  BLOOD GAS, ARTERIAL  TYPE AND SCREEN  TYPE AND SCREEN   ____________________________________________  EKG   ____________________________________________  RADIOLOGY  CXR:L large pneumothorax  ____________________________________________   PROCEDURES  Procedure(s) performed: Yes  CHEST TUBE INSERTION Date/Time: 08/24/2017 at 12:19 AM Performed by: Arta Silence Consent: The procedure was performed in an emergent situation.  Patient gave verbal consent.  Imaging studies: imaging studies available Required items: required equipment available Patient identity confirmed: arm band and available demographic data Time out: Immediately prior to procedure a "time out" was called to verify the correct patient, procedure, equipment, support staff and site/side marked as required.  Indications: pneumothorax Patient sedated: No Anesthesia: Local: lidocaine 1% w/o epinephrine Preparation: skin prepped with chloraprep  Placement location: Left anterior axillary line, 4th intercostal space.   Scalpel size: 11 Tube type: Cook chest tube Dissection instrument: Tube trochar  Tension  pneumothorax heard: No Tube connected to: water seal Drainage characteristics: Product manager: silk Dressing: Sterile  Post-insertion x-ray findings: Appropriate placement, with reinflation of lung Patient tolerance: Patient tolerated the procedure well with no immediate complications    Critical Care performed: Yes  CRITICAL CARE Performed by: Arta Silence   Total critical care time: 60 minutes  Critical care time was exclusive of separately billable procedures and treating other patients.  Critical care was necessary to treat or prevent imminent or life-threatening deterioration.  Critical care was time spent personally by me on the following activities: development of treatment plan with patient and/or surrogate as well as nursing, discussions with consultants, evaluation of patient's response to treatment, examination of patient, obtaining history from patient or surrogate, ordering and performing treatments and interventions, ordering and review of laboratory studies, ordering and review of radiographic studies, pulse oximetry and re-evaluation of patient's condition.  ____________________________________________   INITIAL IMPRESSION / ASSESSMENT AND PLAN / ED COURSE  Pertinent labs & imaging results that were available during my care of the patient were reviewed by me and considered in my medical decision making (see chart for details).  70 year old male with a history of stage IV esophageal cancer and a recent admission for syncope and nonoperative pelvic fractures, discharged earlier today, presents with acute respiratory distress.  Patient states he is short of breath denies chest pain, but is unable to give any other history himself.  There was some concern at the facility for an allergic reaction, however patient does not have any hives, wheezing, oropharyngeal swelling, or other signs of an allergic reaction.  Review of past medical records in epic confirms  that patient was admitted and discharged earlier today after syncope, and subsequent diagnosis of pubic rami and sacral fractures which were nonoperative.  On exam, patient is significantly tachycardic and somewhat hypoxic, but other vital signs are normal.  He is extremely weak and cachectic appearing, and pale.  His breath sounds are slightly decreased bilaterally but there are no other focal pulmonary findings, and he has no significant lower extremity edema.  Neuro exam is nonfocal.  Differential includes pneumonia, UTI or other source of sepsis, dehydration or other metabolic cause, or less likely ACS.  Given the relatively acute onset of the symptoms, the hypoxia and tachycardia, and patient's recent admission, I also have increased suspicion for PE.  If the chest x-ray does not show any significant infiltrate or other findings to explain patient's times, will obtain CT chest.    Patient placed on BiPAP for acute respiratory distress, work of breathing, and hypoxia.  Plan for likely admission.     -----------------------------------------  12:22 AM on 08/24/2017 -----------------------------------------  Patient had improvement in his respiratory status on BiPAP.  Chest x-ray revealed a large spontaneous left sided pneumothorax, with no evidence of tension.  Due to patient's acutely abnormal vital signs, a chest tube was placed emergently in the usual sterile fashion.  Patient tolerated the procedure well and there were no immediate complications.  After the tube was placed, patient's vital signs significant improved, with O2 sat 100% on nasal cannula and significantly improved work of breathing.  I consulted Dr. Jamal Collin from general surgery in relation to the chest tube, and the surgical team will follow.  Patient's additional workup reveals evidence of a UTI, and likely sepsis given the elevated lactate and his vital signs.  Cefepime was given for empiric treatment of UTI given patient's  recent hospitalization.  I signed the patient out to the hospitalist, Dr. Marcille Blanco.  ____________________________________________   FINAL CLINICAL IMPRESSION(S) / ED DIAGNOSES  Final diagnoses:  Pneumothorax, unspecified type  Sepsis, due to unspecified organism ([place])  Urinary tract infection without hematuria, site unspecified      NEW MEDICATIONS STARTED DURING THIS VISIT:  This SmartLink is deprecated. Use AVSMEDLIST instead to display the medication list for a patient.   Note:  This document was prepared using Dragon voice recognition software and may include unintentional dictation errors.    Arta Silence, MD 08/24/17 [phone]

## 2017-08-23 NOTE — Clinical Social Work Note (Addendum)
The patient has chosen [facility] for discharge. The CSW is waiting for confirmation from the facility that the patient can discharge to them today.  CSW has updated the choice with [employer] and is waiting for the final authorization #.  Larry Glad from [employer] returned the call and confirmed that the patient is authorized to discharge to [facility] # [id number]). The authorization is good for 7 days, and the insurance company will contact [facility] tomorrow to advise when to send updated clinicals.  Larry Wood from [facility] returned the call and is attempting to open up a room for the patient to discharge today.   [facility]ry [facility] was able to clear a room for the patient to receive today. The CSW has delivered the packet, and the patient's documentation has been sent to the facility. CSW is signing off. Please consult should additional needs arise.  Larry Wood, MSW, Latanya Presser [phone]

## 2017-08-23 NOTE — Progress Notes (Signed)
PT Cancellation Note  Patient Details Name: DEMICHAEL TRAUM MRN: [id number] DOB: 1946/05/09   Cancelled Treatment:    Reason Eval/Treat Not Completed: Fatigue/lethargy limiting ability to participate. Patient is in the chair when therapist enters the room, his family is with him. He is going to be discharged likely later today to a rehab facility, with his family present he would like to spend time with them and declines therapy at this time. PT will continue to monitor as long as he is here and re-attempt when more appropriate.    Royce Macadamia PT, DPT, CSCS    08/23/2017, 4:25 PM

## 2017-08-23 NOTE — ED Notes (Signed)
Date and time results received: 08/23/17 2044  Test: Lactic Acid Critical Value: 3.0  Name of Provider Notified: Siadecki  Orders Received? Or Actions Taken?: None

## 2017-08-23 NOTE — Plan of Care (Signed)
Pt resting. Sad mood. Offered Chaplain services pt declined. Pt still immobile due to increasing pain with movement.  Progressing Education: Knowledge of General Education information will improve 08/23/2017 0241 - Progressing by Jeffie Pollock, RN Health Behavior/Discharge Planning: Ability to manage health-related needs will improve 08/23/2017 0241 - Progressing by Jeffie Pollock, RN Clinical Measurements: Ability to maintain clinical measurements within normal limits will improve 08/23/2017 0241 - Progressing by Jeffie Pollock, RN Will remain free from infection 08/23/2017 0241 - Progressing by Jeffie Pollock, RN Diagnostic test results will improve 08/23/2017 0241 - Progressing by Jeffie Pollock, RN Respiratory complications will improve 08/23/2017 0241 - Progressing by Jeffie Pollock, RN Cardiovascular complication will be avoided 08/23/2017 0241 - Progressing by Jeffie Pollock, RN Activity: Risk for activity intolerance will decrease 08/23/2017 0241 - Progressing by Jeffie Pollock, RN Nutrition: Adequate nutrition will be maintained 08/23/2017 0241 - Progressing by Jeffie Pollock, RN Coping: Level of anxiety will decrease 08/23/2017 0241 - Progressing by Jeffie Pollock, RN Elimination: Will not experience complications related to bowel motility 08/23/2017 0241 - Progressing by Jeffie Pollock, RN Will not experience complications related to urinary retention 08/23/2017 0241 - Progressing by Jeffie Pollock, RN Pain Managment: General experience of comfort will improve 08/23/2017 0241 - Progressing by Jeffie Pollock, RN Safety: Ability to remain free from injury will improve 08/23/2017 0241 - Progressing by Jeffie Pollock, RN Skin Integrity: Risk for impaired skin integrity will decrease 08/23/2017 0241 - Progressing by Jeffie Pollock, RN Nutritional: Maintenance of adequate nutrition will  improve 08/23/2017 0241 - Progressing by Jeffie Pollock, RN

## 2017-08-24 ENCOUNTER — Other Ambulatory Visit: Payer: Self-pay

## 2017-08-24 ENCOUNTER — Inpatient Hospital Stay: Payer: PPO

## 2017-08-24 ENCOUNTER — Encounter: Payer: Self-pay | Admitting: Internal Medicine

## 2017-08-24 DIAGNOSIS — G8929 Other chronic pain: Secondary | ICD-10-CM | POA: Diagnosis present

## 2017-08-24 DIAGNOSIS — J939 Pneumothorax, unspecified: Principal | ICD-10-CM

## 2017-08-24 DIAGNOSIS — E871 Hypo-osmolality and hyponatremia: Secondary | ICD-10-CM | POA: Diagnosis present

## 2017-08-24 DIAGNOSIS — J9383 Other pneumothorax: Secondary | ICD-10-CM

## 2017-08-24 DIAGNOSIS — J9600 Acute respiratory failure, unspecified whether with hypoxia or hypercapnia: Secondary | ICD-10-CM | POA: Diagnosis present

## 2017-08-24 DIAGNOSIS — F4320 Adjustment disorder, unspecified: Secondary | ICD-10-CM | POA: Diagnosis present

## 2017-08-24 DIAGNOSIS — Z8501 Personal history of malignant neoplasm of esophagus: Secondary | ICD-10-CM | POA: Diagnosis not present

## 2017-08-24 DIAGNOSIS — C160 Malignant neoplasm of cardia: Secondary | ICD-10-CM | POA: Diagnosis present

## 2017-08-24 DIAGNOSIS — D649 Anemia, unspecified: Secondary | ICD-10-CM | POA: Diagnosis present

## 2017-08-24 DIAGNOSIS — J9601 Acute respiratory failure with hypoxia: Secondary | ICD-10-CM | POA: Diagnosis present

## 2017-08-24 DIAGNOSIS — N390 Urinary tract infection, site not specified: Secondary | ICD-10-CM | POA: Diagnosis present

## 2017-08-24 DIAGNOSIS — I5033 Acute on chronic diastolic (congestive) heart failure: Secondary | ICD-10-CM | POA: Diagnosis present

## 2017-08-24 DIAGNOSIS — Z808 Family history of malignant neoplasm of other organs or systems: Secondary | ICD-10-CM | POA: Diagnosis not present

## 2017-08-24 DIAGNOSIS — J441 Chronic obstructive pulmonary disease with (acute) exacerbation: Secondary | ICD-10-CM | POA: Diagnosis present

## 2017-08-24 DIAGNOSIS — Z87891 Personal history of nicotine dependence: Secondary | ICD-10-CM | POA: Diagnosis not present

## 2017-08-24 DIAGNOSIS — R0602 Shortness of breath: Secondary | ICD-10-CM | POA: Diagnosis present

## 2017-08-24 DIAGNOSIS — J9312 Secondary spontaneous pneumothorax: Secondary | ICD-10-CM | POA: Diagnosis not present

## 2017-08-24 DIAGNOSIS — Z681 Body mass index (BMI) 19 or less, adult: Secondary | ICD-10-CM | POA: Diagnosis not present

## 2017-08-24 DIAGNOSIS — Z66 Do not resuscitate: Secondary | ICD-10-CM | POA: Diagnosis present

## 2017-08-24 DIAGNOSIS — E43 Unspecified severe protein-calorie malnutrition: Secondary | ICD-10-CM | POA: Diagnosis present

## 2017-08-24 DIAGNOSIS — E860 Dehydration: Secondary | ICD-10-CM | POA: Diagnosis present

## 2017-08-24 LAB — LACTIC ACID, PLASMA: Lactic Acid, Venous: 0.9 mmol/L (ref 0.5–1.9)

## 2017-08-24 MED ORDER — MORPHINE SULFATE ER 30 MG PO TBCR
30.0000 mg | EXTENDED_RELEASE_TABLET | Freq: Two times a day (BID) | ORAL | Status: DC
  Administered 2017-08-24 – 2017-08-30 (×14): 30 mg via ORAL
  Filled 2017-08-24 (×14): qty 1

## 2017-08-24 MED ORDER — ONDANSETRON HCL 4 MG/2ML IJ SOLN: 4.0000 mg | Freq: Four times a day (QID) | INTRAMUSCULAR | Status: DC | PRN

## 2017-08-24 MED ORDER — ALPRAZOLAM 0.5 MG PO TABS
0.2500 mg | ORAL_TABLET | Freq: Three times a day (TID) | ORAL | Status: DC | PRN
  Administered 2017-08-24 – 2017-08-30 (×7): 0.25 mg via ORAL
  Filled 2017-08-24 (×7): qty 1

## 2017-08-24 MED ORDER — SODIUM CHLORIDE 0.9 % IV SOLN
INTRAVENOUS | Status: DC
  Administered 2017-08-24 – 2017-08-27 (×5): via INTRAVENOUS

## 2017-08-24 MED ORDER — HEPARIN SODIUM (PORCINE) 5000 UNIT/ML IJ SOLN
5000.0000 [IU] | Freq: Three times a day (TID) | INTRAMUSCULAR | Status: DC
  Administered 2017-08-24 – 2017-08-29 (×12): 5000 [IU] via SUBCUTANEOUS
  Filled 2017-08-24 (×14): qty 1

## 2017-08-24 MED ORDER — POLYETHYLENE GLYCOL 3350 17 G PO PACK
17.0000 g | PACK | Freq: Every day | ORAL | Status: DC | PRN
  Administered 2017-08-28: 17 g via ORAL
  Filled 2017-08-24: qty 1

## 2017-08-24 MED ORDER — ADULT MULTIVITAMIN W/MINERALS CH
1.0000 | ORAL_TABLET | Freq: Every day | ORAL | Status: DC
  Administered 2017-08-24 – 2017-08-30 (×7): 1 via ORAL
  Filled 2017-08-24 (×7): qty 1

## 2017-08-24 MED ORDER — ACETAMINOPHEN 650 MG RE SUPP: 650.0000 mg | Freq: Four times a day (QID) | RECTAL | Status: DC | PRN

## 2017-08-24 MED ORDER — ONDANSETRON HCL 4 MG PO TABS: 4.0000 mg | ORAL_TABLET | Freq: Four times a day (QID) | ORAL | Status: DC | PRN

## 2017-08-24 MED ORDER — PANTOPRAZOLE SODIUM 40 MG PO TBEC
40.0000 mg | DELAYED_RELEASE_TABLET | Freq: Two times a day (BID) | ORAL | Status: DC
  Administered 2017-08-24 – 2017-08-30 (×14): 40 mg via ORAL
  Filled 2017-08-24 (×14): qty 1

## 2017-08-24 MED ORDER — DOCUSATE SODIUM 100 MG PO CAPS
100.0000 mg | ORAL_CAPSULE | Freq: Two times a day (BID) | ORAL | Status: DC
  Administered 2017-08-24 – 2017-08-30 (×14): 100 mg via ORAL
  Filled 2017-08-24 (×15): qty 1

## 2017-08-24 MED ORDER — MORPHINE SULFATE (PF) 2 MG/ML IV SOLN
2.0000 mg | INTRAVENOUS | Status: DC | PRN
  Administered 2017-08-24 – 2017-08-28 (×17): 2 mg via INTRAVENOUS
  Filled 2017-08-24 (×19): qty 1

## 2017-08-24 MED ORDER — ACETAMINOPHEN 325 MG PO TABS: 650.0000 mg | ORAL_TABLET | Freq: Four times a day (QID) | ORAL | Status: DC | PRN

## 2017-08-24 MED ORDER — HYDROCODONE-ACETAMINOPHEN 5-325 MG PO TABS
1.0000 | ORAL_TABLET | ORAL | Status: DC | PRN
  Administered 2017-08-24 (×2): 2 via ORAL
  Administered 2017-08-24: 1 via ORAL
  Administered 2017-08-25 – 2017-08-28 (×17): 2 via ORAL
  Filled 2017-08-24 (×13): qty 2
  Filled 2017-08-24: qty 1
  Filled 2017-08-24 (×7): qty 2

## 2017-08-24 NOTE — NC FL2 (Signed)
[place] LEVEL OF CARE SCREENING TOOL     IDENTIFICATION  Patient Name: Larry Wood Birthdate: 1946/05/31 Sex: male Admission Date (Current Location): 08/23/2017  Larry Wood and [place] Number:  Engineering geologist and Address:  [facility], [address], [place], [place] [address]      Provider Number: [id number]  Attending Physician Name and Address:  Bettey Costa, MD  Relative Name and Phone Number:       Current Level of Care: Hospital Recommended Level of Care: [place] Prior Approval Number:    Date Approved/Denied:   PASRR Number: [id number])  Discharge Plan: SNF    Current Diagnoses: Patient Active Problem List   Diagnosis Date Noted  . Acute respiratory failure ([place]) 08/24/2017  . Pneumothorax   . Multiple closed pelvic fractures with disruption of pelvic circle ([place]) 08/21/2017  . Cachexia ([place]) 05/15/2017  . Encounter for antineoplastic chemotherapy 04/12/2017  . Neoplasm related pain 04/12/2017  . Iron deficiency anemia due to chronic blood loss 03/27/2017  . Liver metastases ([place]) 03/27/2017  . Goals of care, counseling/discussion 03/27/2017  . 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 Esophageal cancer, stage IV ([place]) 03/19/2017  . Epigastric abdominal tenderness 03/10/2017  . Gastric mass 03/10/2017  . Hyponatremia 03/09/2017    Orientation RESPIRATION BLADDER Height & Weight     Self, Time, Situation, Place  Normal Continent Weight: 125 lb 5 oz (56.8 kg) Height:  5\' 10"  (177.8 cm)  BEHAVIORAL SYMPTOMS/MOOD NEUROLOGICAL BOWEL NUTRITION STATUS      Continent Diet(Regular Diet )  AMBULATORY STATUS COMMUNICATION OF NEEDS Skin   Extensive Assist Verbally Other (Comment)(Chest-Tube )                       Personal Care Assistance Level of Assistance  Bathing, Feeding, Dressing Bathing Assistance: Limited assistance Feeding assistance: Independent Dressing  Assistance: Limited assistance     Functional Limitations Info  Sight, Hearing, Speech Sight Info: Adequate Hearing Info: Adequate Speech Info: Adequate    SPECIAL CARE FACTORS FREQUENCY  PT (By licensed PT), OT (By licensed OT)     PT Frequency: (5) OT Frequency: (5)            Contractures      Additional Factors Info  Code Status, Allergies Code Status Info: (DNR ) Allergies Info: (No Known Allergies. )           Current Medications (08/24/2017):  This is the current hospital active medication list Current Facility-Administered Medications  Medication Dose Route Frequency Provider Last Rate Last Dose  . 0.9 %  sodium chloride infusion   Intravenous Continuous Bettey Costa, MD 75 mL/hr at 08/24/17 1109    . acetaminophen (TYLENOL) tablet 650 mg  650 mg Oral Q6H PRN Harrie Foreman, MD       Or  . acetaminophen (TYLENOL) suppository 650 mg  650 mg Rectal Q6H PRN Harrie Foreman, MD      . ALPRAZolam Duanne Moron) tablet 0.25 mg  0.25 mg Oral TID PRN Harrie Foreman, MD   0.25 mg at 08/24/17 1549  . docusate sodium (COLACE) capsule 100 mg  100 mg Oral BID Harrie Foreman, MD   100 mg at 08/24/17 1106  . [COMPLETED] fentaNYL (SUBLIMAZE) injection 100 mcg  100 mcg Intravenous Once Arta Silence, MD   100 mcg at 08/23/17 2336  . heparin injection 5,000 Units  5,000 Units Subcutaneous Q8H Harrie Foreman, MD  5,000 Units at 08/24/17 1325  . HYDROcodone-acetaminophen (NORCO/VICODIN) 5-325 MG per tablet 1-2 tablet  1-2 tablet Oral Q4H PRN Harrie Foreman, MD   2 tablet at 08/24/17 1731  . morphine (MS CONTIN) 12 hr tablet 30 mg  30 mg Oral Q12H Harrie Foreman, MD   30 mg at 08/24/17 1106  . morphine 2 MG/ML injection 2 mg  2 mg Intravenous Q4H PRN Saundra Shelling, MD   2 mg at 08/24/17 2004  . multivitamin with minerals tablet 1 tablet  1 tablet Oral Daily Bettey Costa, MD   1 tablet at 08/24/17 1106  . ondansetron (ZOFRAN) tablet 4 mg  4 mg Oral Q6H PRN  Harrie Foreman, MD       Or  . ondansetron [facility]) injection 4 mg  4 mg Intravenous Q6H PRN Harrie Foreman, MD      . pantoprazole (PROTONIX) EC tablet 40 mg  40 mg Oral BID Harrie Foreman, MD   40 mg at 08/24/17 1106  . polyethylene glycol (MIRALAX / GLYCOLAX) packet 17 g  17 g Oral Daily PRN Harrie Foreman, MD       Facility-Administered Medications Ordered in Other Encounters  Medication Dose Route Frequency Provider Last Rate Last Dose  . sodium chloride flush (NS) 0.9 % injection 3 mL  3 mL Intracatheter PRN Sindy Guadeloupe, MD         Discharge Medications: Please see discharge summary for a list of discharge medications.  Relevant Imaging Results:  Relevant Lab Results:   Additional Information (SSN: [id number])  Mavric Cortright, Veronia Beets, LCSW

## 2017-08-24 NOTE — Clinical Social Work Note (Signed)
Clinical Social Work Assessment  Patient Details  Name: Larry Wood MRN: [id number] Date of Birth: 1945-11-12  Date of referral:  08/24/17               Reason for consult:  Facility Placement, End of Life/Hospice                Permission sought to share information with:    Permission granted to share information::     Name::      Larry Wood::     Relationship::     Contact Information:     Housing/Transportation Living arrangements for the past 2 months:  Single Family Home Source of Information:  Patient Patient Interpreter Needed:  None Criminal Activity/Legal Involvement Pertinent to Current Situation/Hospitalization:  No - Comment as needed Significant Relationships:  Adult Children, Siblings Lives with:  Self Do you feel safe going back to the place where you live?  Yes Need for family participation in patient care:  Yes (Comment)  Care giving concerns: Patient is a readmission to [facility]. He came from [facility] where he was at for less then 24 hours.   Social Worker assessment / plan:  Holiday representative ([facility]) reviewed chart and noted that patient is from [facility]. Per [employer] liaison patient can return when stable. PT is recommending SNF. CSW met with patient and several family members were at bedside. Patient was alert and oriented X4 and was sitting up in the chair at bedside. CSW introduced self and explained role of CSW department. Patient reported that his wife passed away 2 days ago, the day he came into [facility] the first time. CSW provided emotional support. Patient reported that his sister Larry Wood is his primary contact now. Patient reported that he did not like [facility] and does not want to go back there. Patient explained that he has decided to stop all cancer treatments and focus on comfort. Patient reported that he is interested in going home to his Wood in [facility] with  hospice. RN case manager aware of above. CSW explained to patient that he can't go to SNF for rehab under hospice. CSW also explained that Health Team would have to approve SNF again. Patient verbalized his understanding. CSW will continue to follow and assist as needed.   Employment status:  Disabled (Comment on whether or not currently receiving Disability) Insurance information:  Managed Medicare PT Recommendations:  [place]ry [place] / Referral to community resources:  Larry [facility]  Patient/Family's Response to care:  Patient is considering hospice.   Patient/Family's Understanding of and Emotional Response to Diagnosis, Current Treatment, and Prognosis:  Patient was very pleasant and thanked CSW for assistance.   Emotional Assessment Appearance:  Appears stated age Attitude/Demeanor/Rapport:    Affect (typically observed):  Accepting Orientation:  Oriented to Self, Oriented to Place, Oriented to  Time, Oriented to Situation Alcohol / Substance use:  Not Applicable Psych involvement (Current and /or in the community):  No (Comment)  Discharge Needs  Concerns to be addressed:  Discharge Planning Concerns Readmission within the last 30 days:  No Current discharge risk:  Dependent with Mobility Barriers to Discharge:  Continued Medical Work up   [employer], Larry Beets, LCSW 08/24/2017, 9:17 PM

## 2017-08-24 NOTE — ED Notes (Signed)
Pt 100% on 10L NRB after chest tube placed.  Pt decreased to 6L [place] at this time.

## 2017-08-24 NOTE — Progress Notes (Signed)
Initial Nutrition Assessment  DOCUMENTATION CODES:   Severe malnutrition in context of chronic illness  INTERVENTION:   Liberalize diet  Pt refuses supplements and snacks  RD will add Magic cups to all meal trays, each supplement provides 290 kcal and 9 grams of protein  MVI daily   NUTRITION DIAGNOSIS:   Severe Malnutrition related to cancer and cancer related treatments as evidenced by severe fat depletion, severe muscle depletion.  GOAL:   Patient will meet greater than or equal to 90% of their needs  MONITOR:   PO intake, Labs, Weight trends, Skin, I & O's  REASON FOR ASSESSMENT:   Other (Comment)(low BMI)    ASSESSMENT:   71 y.o. male with past medical history including stage IV metyastatic esophageal cancer and recent admission for syncope and nonoperative pelvic fractures, who presents with respiratory distress, acute onset when he was in the rehab facility, occurring after he was administered Norvasc, and associated with increased generalized weakness. Chest x-ray revealed a large spontaneous left sided pneumothorax, with no evidence of tension.  Due to patient's acutely abnormal vital signs, a chest tube was placed emergently    Patient is known to RDs from previous admissions. He reports his appetite has actually been better lately until his pain has picked up lately. He reports he had been eating 3-4 small meals per day. He enjoys potatoes, beans, and corn. Patient endorses some abdominal pain. He reports he does not drink any oral nutrition supplements because he is tired of all of them. He does not want any here, either. He denies any difficulty chewing/swallowing. Per chart, pt with recent weight gain. Pt eating <50% of meals. RD will order Magic Cups and MVI. RD will also liberalize diet.   Diet Order:  Diet Heart Room service appropriate? Yes; Fluid consistency: Thin  EDUCATION NEEDS:   Not appropriate for education at this time  Skin: Reviewed RN  Assessment  Last BM:  11/20  Height:   Ht Readings from Last 1 Encounters:  08/24/17 5\' 10"  (1.778 m)    Weight:   Wt Readings from Last 1 Encounters:  08/24/17 125 lb 5 oz (56.8 kg)    Ideal Body Weight:  75.5 kg  BMI:  Body mass index is 17.98 kg/m.  Estimated Nutritional Needs:   Kcal:  1700-2000kcal/day   Protein:  80-95g/day   Fluid:  >1.7L/day   Koleen Distance MS, RD, LDN Pager #[phone] After Hours Pager: [phone]

## 2017-08-24 NOTE — Evaluation (Signed)
Physical Therapy Evaluation Patient Details Name: Larry Wood MRN: [id number] DOB: May 10, 1946 Today's Date: 08/24/2017   History of Present Illness  Pt is a 71 y/o M who presented to ER on 11/20 secondary to unwitnessed fall in home environment with acute onset of back, pelvic and bilat hip pain. Per CT of pelvis, noted with R superior/inferior pubic rami fractures, L sacral ala fracture and L anterior acetabular wall fracture. Per notes, patient to be WBAT bilat LEs.  Pt was d/c to SNF and was only there for 3 hrs before returning to the hospital as pt was presenting with SOB.  The pt was placed on a nonrebreather and chest x-ray demonstrates pneumothorax.  Pt had chest tube placed with rapid improvement in work of breathing.  Pt's PMH includes cancer, COPD, stage IV gastroesophageal junction carcinoma.  Pt still grieving as his wife passed away just a few days PTA.      Clinical Impression  Pt admitted with above diagnosis. Pt currently with functional limitations due to the deficits listed below (see PT Problem List). Larry Wood was pleasant and agreeable to work with therapy despite significant pain.  He currently requires mod assist for bed mobility, min assist for transfers, and min assist to ambulate 5 ft, limited by pain.  HR up to 145 while ambulating with the pt requiring several minutes to rest to recover.  Given pt's current mobility status, recommending SNF at d/c. Pt will benefit from skilled PT to increase their independence and safety with mobility to allow discharge to the venue listed below.      Follow Up Recommendations SNF    Equipment Recommendations  Other (comment)(TBD at next venue of care)    Recommendations for Other Services       Precautions / Restrictions Precautions Precautions: Fall;Other (comment) Precaution Comments: chest tube Restrictions Weight Bearing Restrictions: Yes RLE Weight Bearing: Weight bearing as tolerated LLE Weight Bearing:  Weight bearing as tolerated      Mobility  Bed Mobility Overal bed mobility: Needs Assistance Bed Mobility: Supine to Sit     Supine to sit: Mod assist     General bed mobility comments: Assist to elevate trunk and to advance BLEs to EOB.  Pt requires increased time due to pain.   Transfers Overall transfer level: Needs assistance Equipment used: Rolling walker (2 wheeled) Transfers: Sit to/from Stand Sit to Stand: Min assist         General transfer comment: Cues for proper hand placement and safe technique.  Pt is slow to stand.   Ambulation/Gait Ambulation/Gait assistance: Min assist Ambulation Distance (Feet): 5 Feet Assistive device: Rolling walker (2 wheeled) Gait Pattern/deviations: Step-through pattern;Decreased stride length;Antalgic;Trunk flexed Gait velocity: decreased Gait velocity interpretation: Below normal speed for age/gender General Gait Details: Directional cues with RW, pt with poor foot clearance BLEs.  Very decreased gait speed.  Limited by pain.   Stairs            Wheelchair Mobility    Modified Rankin (Stroke Patients Only)       Balance Overall balance assessment: Needs assistance Sitting-balance support: No upper extremity supported;Feet supported Sitting balance-Leahy Scale: Fair Sitting balance - Comments: Pt able to sit statically without UE support but would likely lose balance with perturbation   Standing balance support: Bilateral upper extremity supported;During functional activity Standing balance-Leahy Scale: Poor Standing balance comment: Pt relies on UE support for static and dynamic activities  Pertinent Vitals/Pain Pain Assessment: Faces Faces Pain Scale: Hurts whole lot Pain Location: pelvis, L chest tube site Pain Descriptors / Indicators: Aching;Grimacing;Guarding;Moaning Pain Intervention(s): Limited activity within patient's tolerance;Monitored during  session;Repositioned;Utilized relaxation techniques    Home Living Family/patient expects to be discharged to:: Skilled nursing facility Living Arrangements: Alone Available Help at Discharge: Family Type of Home: House Home Access: Stairs to enter Entrance Stairs-Rails: Left Entrance Stairs-Number of Steps: 4 Home Layout: One level        Prior Function Level of Independence: Independent         Comments: Prior to initial admission the pt was indep with ADLs, household and community mobilization without assist device; denies fall history outside of this event.  Pt was d/c to SNF from hospital and was only there for 3 hrs before returning to the hospital.       Hand Dominance        Extremity/Trunk Assessment   Upper Extremity Assessment Upper Extremity Assessment: Overall WFL for tasks assessed(although functional use of LUE limited due to chest tube)    Lower Extremity Assessment Lower Extremity Assessment: Generalized weakness(BLE strength grossly 3-/5 limited by pain)    Cervical / Trunk Assessment Cervical / Trunk Assessment: Other exceptions Cervical / Trunk Exceptions: L chest tube  Communication   Communication: No difficulties  Cognition Arousal/Alertness: Awake/alert Behavior During Therapy: WFL for tasks assessed/performed Overall Cognitive Status: Within Functional Limits for tasks assessed                                        General Comments General comments (skin integrity, edema, etc.): SpO2 remained at or above 92% on RA throughout entire session.  HR up to 145 when ambulating and pt reports SOB after ambulation, requiring several minutes to relax.     Exercises General Exercises - Lower Extremity Ankle Circles/Pumps: AROM;Both;10 reps;Supine Quad Sets: Strengthening;Both;10 reps;Supine Gluteal Sets: Strengthening;Both;10 reps;Supine Hip ABduction/ADduction: AAROM;Strengthening;Both;5 reps;Supine Straight Leg Raises:  AAROM;Strengthening;Both;5 reps;Supine   Assessment/Plan    PT Assessment Patient needs continued PT services  PT Problem List Decreased strength;Decreased range of motion;Decreased activity tolerance;Decreased balance;Decreased mobility;Decreased knowledge of use of DME;Decreased safety awareness;Decreased knowledge of precautions;Cardiopulmonary status limiting activity;Pain       PT Treatment Interventions DME instruction;Gait training;Stair training;Functional mobility training;Therapeutic activities;Therapeutic exercise;Balance training;Neuromuscular re-education;Wheelchair mobility training;Patient/family education;Modalities    PT Goals (Current goals can be found in the Care Plan section)  Acute Rehab PT Goals Patient Stated Goal: decreased pain, improved strength PT Goal Formulation: With patient Time For Goal Achievement: 09/07/17 Potential to Achieve Goals: Fair    Frequency Min 2X/week   Barriers to discharge        Co-evaluation               AM-PAC PT "6 Clicks" Daily Activity  Outcome Measure Difficulty turning over in bed (including adjusting bedclothes, sheets and blankets)?: Unable Difficulty moving from lying on back to sitting on the side of the bed? : Unable Difficulty sitting down on and standing up from a chair with arms (e.g., wheelchair, bedside commode, etc,.)?: Unable Help needed moving to and from a bed to chair (including a wheelchair)?: A Lot Help needed walking in hospital room?: A Lot Help needed climbing 3-5 steps with a railing? : A Lot 6 Click Score: 9    End of Session Equipment Utilized During Treatment: (no gait belt, chest tube present)  Activity Tolerance: Patient tolerated treatment well;Patient limited by pain Patient left: in chair;with call bell/phone within reach;with chair alarm set;with family/visitor present Nurse Communication: Mobility status;Patient requests pain meds;Other (comment)(SpO2) PT Visit Diagnosis:  Pain;Unsteadiness on feet (R26.81);Other abnormalities of gait and mobility (R26.89);Muscle weakness (generalized) (M62.81) Pain - Right/Left: Left Pain - part of body: Hip    Time: 5859-2924 PT Time Calculation (min) (ACUTE ONLY): 30 min   Charges:   PT Evaluation $PT Eval Moderate Complexity: 1 Mod PT Treatments $Therapeutic Exercise: 8-22 mins   PT G Codes:   PT G-Codes **NOT FOR INPATIENT CLASS** Functional Assessment Tool Used: AM-PAC 6 Clicks Basic Mobility Functional Limitation: Mobility: Walking and moving around Mobility: Walking and Moving Around Current Status (M6286): At least 60 percent but less than 80 percent impaired, limited or restricted Mobility: Walking and Moving Around Goal Status [phone]): At least 20 percent but less than 40 percent impaired, limited or restricted    Collie Siad PT, DPT 08/24/2017, 3:24 PM

## 2017-08-24 NOTE — Progress Notes (Signed)
[facility] NAME: Raj Landress    MR#:  [id number]  DATE OF BIRTH:  06/17/1946  SUBJECTIVE:   Patient was discharged yesterday and was doing well however when he got to the facility that evening shortness of breath. Shortness of breath has improved after chest tube was placed.  REVIEW OF SYSTEMS:    Review of Systems  Constitutional: Negative for fever, chills weight loss HENT: Negative for ear pain, nosebleeds, congestion, facial swelling, rhinorrhea, neck pain, neck stiffness and ear discharge.   Respiratory: Negative for cough, shortness of breath, wheezing  Cardiovascular: Negative for chest pain, palpitations and leg swelling.  Gastrointestinal: Negative for heartburn, abdominal pain, vomiting, diarrhea or consitpation Genitourinary: Negative for dysuria, urgency, frequency, hematuria Musculoskeletal: Negative for back pain or joint pain Neurological: Negative for dizziness, seizures, syncope, focal weakness,  numbness and headaches.  Hematological: Does not bruise/bleed easily.  Psychiatric/Behavioral: Dysphoric mood    Tolerating Diet: yes      DRUG ALLERGIES:  No Known Allergies  VITALS:  Blood pressure 139/75, pulse 87, temperature (!) 97.4 F (36.3 C), temperature source Oral, resp. rate 20, height 5\' 10"  (1.778 m), weight 56.8 kg (125 lb 5 oz), SpO2 97 %.  PHYSICAL EXAMINATION:  Constitutional: Appears thin frail and cachectic  HENT: Normocephalic. Marland Kitchen Oropharynx is clear and moist.  Eyes: Conjunctivae and EOM are normal. PERRLA, no scleral icterus.  Neck: Normal ROM. Neck supple. No JVD. No tracheal deviation. CVS: RRR, S1/S2 +, no murmurs, no gallops, no carotid bruit.  Pulmonary: Effort and breath sounds normal, no stridor, rhonchi, wheezes, rales.  Chest tube placed Abdominal: Soft. BS +,  no distension, tenderness, rebound or guarding.  Musculoskeletal: Normal range of motion. No edema and no  tenderness.  Neuro: Alert. CN 2-12 grossly intact. No focal deficits. Skin: Skin is warm and dry. No rash noted. Psychiatric: Dysphoric mood    LABORATORY PANEL:   CBC Recent Labs  Lab 08/23/17 2137  WBC 11.2*  HGB 8.4*  HCT 26.1*  PLT 360   ------------------------------------------------------------------------------------------------------------------  Chemistries  Recent Labs  Lab 08/23/17 2137  NA 133*  K 4.0  CL 100*  CO2 23  GLUCOSE 192*  BUN 17  CREATININE 0.61  CALCIUM 8.1*  AST 32  ALT 15*  ALKPHOS 112  BILITOT 0.5   ------------------------------------------------------------------------------------------------------------------  Cardiac Enzymes Recent Labs  Lab 08/22/17 0429 08/22/17 1240 08/23/17 2137  TROPONINI <0.03 <0.03 <0.03   ------------------------------------------------------------------------------------------------------------------  RADIOLOGY:  US Carotid Bilateral  Result Date: 08/22/2017 CLINICAL DATA:  Syncope and collapse EXAM: BILATERAL CAROTID DUPLEX ULTRASOUND TECHNIQUE: Pearline Cables scale imaging, color Doppler and duplex ultrasound were performed of bilateral carotid and vertebral arteries in the neck. COMPARISON:  None. FINDINGS: Criteria: Quantification of carotid stenosis is based on velocity parameters that correlate the residual internal carotid diameter with NASCET-based stenosis levels, using the diameter of the distal internal carotid lumen as the denominator for stenosis measurement. The following velocity measurements were obtained: RIGHT ICA:  79 cm/sec CCA:  86 cm/sec SYSTOLIC ICA/CCA RATIO:  1.4 DIASTOLIC ICA/CCA RATIO:  2.2 ECA:  123 cm/sec LEFT ICA:  62 cm/sec CCA:  69 cm/sec SYSTOLIC ICA/CCA RATIO:  0.9 DIASTOLIC ICA/CCA RATIO:  1.3 ECA:  90 cm/sec RIGHT CAROTID ARTERY: Mild irregular calcified plaque in the bulb. Low resistance internal carotid Doppler pattern. RIGHT VERTEBRAL ARTERY:  Antegrade. LEFT CAROTID ARTERY:  Mild irregular calcified plaque in the bulb. Low resistance internal carotid Doppler pattern is preserved. LEFT VERTEBRAL  ARTERY:  Antegrade. IMPRESSION: Less than 50% stenosis in the right and left internal carotid arteries. Electronically Signed   By: Marybelle Killings M.D.   On: 08/22/2017 12:30   Dg Chest Portable 1 View  Result Date: 08/23/2017 CLINICAL DATA:  Left chest tube placement. EXAM: PORTABLE CHEST 1 VIEW COMPARISON:  2218 hours on the same day FINDINGS: Re-expansion of the left lung status post left-sided chest tube placement. The tip of the chest tube drapes over the left lung apex and terminates adjacent to the aortic arch. There is a small peripheral pneumothorax noted along the mid left lung measuring up to 7 mm in thickness. The lungs are hyperinflated. Heart and mediastinal contours are stable. Port catheter tip terminates in the distal SVC. There is aortic atherosclerosis without aneurysm. No acute osseous abnormality. IMPRESSION: New left-sided chest tube with tiny peripheral residual pneumothorax seen along the left mid lung measuring up to 7 mm in thickness. Electronically Signed   By: Ashley Royalty M.D.   On: 08/23/2017 23:38   Dg Chest Portable 1 View  Result Date: 08/23/2017 CLINICAL DATA:  Hypoxia on 15 L of non repair either. Left arm swelling. History of cancer. EXAM: PORTABLE CHEST 1 VIEW COMPARISON:  Chest CT 06/26/2017, CXR 06/26/2017. FINDINGS: New large left-sided pneumothorax of roughly 50% of the left lung without mediastinal shift. Hyperinflated right lung with port catheter tip in the distal SVC from right-sided approach. Aortic atherosclerosis is noted. Heart size is normal. No acute osseous abnormality. IMPRESSION: 1. Large at least 50% pneumothorax on the left without mediastinal shift or tension. Critical Value/emergent results were called by telephone at the time of interpretation on 08/23/2017 at 10:39 pm to Dr. Arta Silence , who verbally acknowledged these  results. 2. Pulmonary hyperinflation of the right lung with port catheter in place. 3. Aortic atherosclerosis. Electronically Signed   By: Ashley Royalty M.D.   On: 08/23/2017 22:39     ASSESSMENT AND PLAN:   71 year old male who was discharged yesterday after pelvic fracture and discharged to skilled nursing facility who presents with shortness of breath and found to have pneumothorax.   1. Pneumothorax: Patient has chest tube placed. Follow up on surgery consultation regarding removal of chest tube.  2. Hyponatremia due to dehydration  3. Recent pelvic fracture: Continue pain management and physical therapy consultation and  4. Protein calorie malnutrition: Continue protein supplements.  5. stage IV gastroesophageal junction carcinoma: Continue pain management  6. Acute grief reaction: Patient is emotional due to his wife passing away recently.   Management plans discussed with the patient and he is in agreement.  CODE STATUS: dnr  TOTAL TIME TAKING CARE OF THIS PATIENT: 30 minutes.     POSSIBLE D/C 2-4 days, DEPENDING ON CLINICAL CONDITION.   Mayson Mcneish M.D on 08/24/2017 at 11:03 AM  Between 7am to 6pm - Pager - [phone] After 6pm go to [email or web address] - password [email or web address] [facility]  Office  [phone]  CC: Primary care physician; Derinda Late, MD  Note: This dictation was prepared with Dragon dictation along with smaller phrase technology. Any transcriptional errors that result from this process are unintentional.

## 2017-08-24 NOTE — ED Notes (Signed)
Pt 100% on 15L NRB after chest tube placed.  Pt decreased to 10L NRB at this time.

## 2017-08-24 NOTE — H&P (Addendum)
Larry Wood is an 71 y.o. male.   Chief Complaint: Respiratory distress HPI: The patient who was discharged from the hospital today following admission for syncope and pelvic fractures presents to the emergency department via EMS after becoming acutely short of breath at his rehab facility.  The patient states that he took an oxycodone tablet for pain but began to develop shortness of breath and pain in his left shoulder.  The pain grew in severity in addition to his difficulty breathing.  Oxygen saturations were in the low 90s but work of breathing was significant upon arrival to the emergency department.  The patient was placed on nonrebreather.  Chest x-ray demonstrated pneumothorax and the emergency department physician placed a chest tube with rapid improvement in work of breathing.  Antibiotics were given in the emergency department due to concern for pneumonia and the hospitalist service was called for admission.  Past Medical History:  Diagnosis Date  . Anemia   . Cancer ([place])   . COPD (chronic obstructive pulmonary disease) ([place])     Past Surgical History:  Procedure Laterality Date  . ESOPHAGOGASTRODUODENOSCOPY (EGD) WITH PROPOFOL N/A 03/10/2017   Procedure: ESOPHAGOGASTRODUODENOSCOPY (EGD) WITH PROPOFOL;  Surgeon: Wilford Corner, MD;  Location: [facility] ENDOSCOPY;  Service: Endoscopy;  Laterality: N/A;  . ESOPHAGOGASTRODUODENOSCOPY (EGD) WITH PROPOFOL N/A 08/17/2017   Procedure: ESOPHAGOGASTRODUODENOSCOPY (EGD) WITH PROPOFOL WITH HEMOSPRAY;  Surgeon: Jonathon Bellows, MD;  Location: [facility] ENDOSCOPY;  Service: Gastroenterology;  Laterality: N/A;  . KNEE ARTHROSCOPY  1991  . PORTA CATH INSERTION N/A 03/21/2017   Procedure: Glori Luis Cath Insertion;  Surgeon: Algernon Huxley, MD;  Location: [place] [facility];  Service: Cardiovascular;  Laterality: N/A;  . TONSILLECTOMY      Family History  Problem Relation Age of Onset  . Other Daughter        overdose   Social History:  reports that he  quit smoking about 7 years ago. he has never used smokeless tobacco. He reports that he does not drink alcohol or use drugs.  Allergies: No Known Allergies   (Not in a hospital admission)  Results for orders placed or performed during the hospital encounter of 08/23/17 (from the past 48 hour(s))  Comprehensive metabolic panel     Status: Abnormal   Collection Time: 08/23/17  9:37 PM  Result Value Ref Range   Sodium 133 (L) 135 - 145 mmol/L   Potassium 4.0 3.5 - 5.1 mmol/L   Chloride 100 (L) 101 - 111 mmol/L   CO2 23 22 - 32 mmol/L   Glucose, Bld 192 (H) 65 - 99 mg/dL   BUN 17 6 - 20 mg/dL   Creatinine, Ser 0.61 0.61 - 1.24 mg/dL   Calcium 8.1 (L) 8.9 - 10.3 mg/dL   Total Protein 4.6 (L) 6.5 - 8.1 g/dL   Albumin 2.2 (L) 3.5 - 5.0 g/dL   AST 32 15 - 41 U/L   ALT 15 (L) 17 - 63 U/L   Alkaline Phosphatase 112 38 - 126 U/L   Total Bilirubin 0.5 0.3 - 1.2 mg/dL   GFR calc non Af Amer >60 >60 mL/min   GFR calc Af Amer >60 >60 mL/min    Comment: (NOTE) The eGFR has been calculated using the CKD EPI equation. This calculation has not been validated in all clinical situations. eGFR's persistently <60 mL/min signify possible Chronic Kidney Disease.    Anion gap 10 5 - 15  Lactic acid, plasma     Status: Abnormal   Collection  Time: 08/23/17  9:37 PM  Result Value Ref Range   Lactic Acid, Venous 3.0 (HH) 0.5 - 1.9 mmol/L    Comment: CRITICAL RESULT CALLED TO, READ BACK BY AND VERIFIED WITH DAVID WALKER ON 08/23/17 AT 2235 JAG   CBC with Differential     Status: Abnormal   Collection Time: 08/23/17  9:37 PM  Result Value Ref Range   WBC 11.2 (H) 3.8 - 10.6 K/uL   RBC 2.96 (L) 4.40 - 5.90 MIL/uL   Hemoglobin 8.4 (L) 13.0 - 18.0 g/dL   HCT 26.1 (L) 40.0 - 52.0 %   MCV 88.1 80.0 - 100.0 fL   MCH 28.5 26.0 - 34.0 pg   MCHC 32.3 32.0 - 36.0 g/dL   RDW 19.3 (H) 11.5 - 14.5 %   Platelets 360 150 - 440 K/uL   Neutrophils Relative % 79 %   Neutro Abs 8.8 (H) 1.4 - 6.5 K/uL    Lymphocytes Relative 10 %   Lymphs Abs 1.1 1.0 - 3.6 K/uL   Monocytes Relative 9 %   Monocytes Absolute 1.0 0.2 - 1.0 K/uL   Eosinophils Relative 1 %   Eosinophils Absolute 0.2 0 - 0.7 K/uL   Basophils Relative 1 %   Basophils Absolute 0.1 0 - 0.1 K/uL  Urinalysis, Complete w Microscopic     Status: Abnormal   Collection Time: 08/23/17  9:37 PM  Result Value Ref Range   Color, Urine AMBER (A) YELLOW    Comment: BIOCHEMICALS MAY BE AFFECTED BY COLOR   APPearance HAZY (A) CLEAR   Specific Gravity, Urine 1.023 1.005 - 1.030   pH 5.0 5.0 - 8.0   Glucose, UA NEGATIVE NEGATIVE mg/dL   Hgb urine dipstick NEGATIVE NEGATIVE   Bilirubin Urine NEGATIVE NEGATIVE   Ketones, ur NEGATIVE NEGATIVE mg/dL   Protein, ur 30 (A) NEGATIVE mg/dL   Nitrite NEGATIVE NEGATIVE   Leukocytes, UA NEGATIVE NEGATIVE   RBC / HPF 6-30 0 - 5 RBC/hpf   WBC, UA 6-30 0 - 5 WBC/hpf   Bacteria, UA NONE SEEN NONE SEEN   Squamous Epithelial / LPF NONE SEEN NONE SEEN   Mucus PRESENT    Hyaline Casts, UA PRESENT    Ca Oxalate Crys, UA PRESENT   Protime-INR     Status: None   Collection Time: 08/23/17  9:37 PM  Result Value Ref Range   Prothrombin Time 13.4 11.4 - 15.2 seconds   INR 1.03   Type and screen [facility]     Status: None (Preliminary result)   Collection Time: 08/23/17  9:37 PM  Result Value Ref Range   ABO/RH(D) PENDING    Antibody Screen PENDING    Sample Expiration 08/26/2017   Troponin I     Status: None   Collection Time: 08/23/17  9:37 PM  Result Value Ref Range   Troponin I <0.03 <0.03 ng/mL  Type and screen Ordered by PROVIDER DEFAULT     Status: None   Collection Time: 08/23/17 10:07 PM  Result Value Ref Range   ABO/RH(D) AB POS    Antibody Screen NEG    Sample Expiration 08/26/2017    US Carotid Bilateral  Result Date: 08/22/2017 CLINICAL DATA:  Syncope and collapse EXAM: BILATERAL CAROTID DUPLEX ULTRASOUND TECHNIQUE: Pearline Cables scale imaging, color Doppler and duplex  ultrasound were performed of bilateral carotid and vertebral arteries in the neck. COMPARISON:  None. FINDINGS: Criteria: Quantification of carotid stenosis is based on velocity parameters that correlate the residual  internal carotid diameter with NASCET-based stenosis levels, using the diameter of the distal internal carotid lumen as the denominator for stenosis measurement. The following velocity measurements were obtained: RIGHT ICA:  79 cm/sec CCA:  86 cm/sec SYSTOLIC ICA/CCA RATIO:  1.4 DIASTOLIC ICA/CCA RATIO:  2.2 ECA:  123 cm/sec LEFT ICA:  62 cm/sec CCA:  69 cm/sec SYSTOLIC ICA/CCA RATIO:  0.9 DIASTOLIC ICA/CCA RATIO:  1.3 ECA:  90 cm/sec RIGHT CAROTID ARTERY: Mild irregular calcified plaque in the bulb. Low resistance internal carotid Doppler pattern. RIGHT VERTEBRAL ARTERY:  Antegrade. LEFT CAROTID ARTERY: Mild irregular calcified plaque in the bulb. Low resistance internal carotid Doppler pattern is preserved. LEFT VERTEBRAL ARTERY:  Antegrade. IMPRESSION: Less than 50% stenosis in the right and left internal carotid arteries. Electronically Signed   By: Marybelle Killings M.D.   On: 08/22/2017 12:30   Dg Chest Portable 1 View  Result Date: 08/23/2017 CLINICAL DATA:  Left chest tube placement. EXAM: PORTABLE CHEST 1 VIEW COMPARISON:  2218 hours on the same day FINDINGS: Re-expansion of the left lung status post left-sided chest tube placement. The tip of the chest tube drapes over the left lung apex and terminates adjacent to the aortic arch. There is a small peripheral pneumothorax noted along the mid left lung measuring up to 7 mm in thickness. The lungs are hyperinflated. Heart and mediastinal contours are stable. Port catheter tip terminates in the distal SVC. There is aortic atherosclerosis without aneurysm. No acute osseous abnormality. IMPRESSION: New left-sided chest tube with tiny peripheral residual pneumothorax seen along the left mid lung measuring up to 7 mm in thickness. Electronically  Signed   By: Ashley Royalty M.D.   On: 08/23/2017 23:38   Dg Chest Portable 1 View  Result Date: 08/23/2017 CLINICAL DATA:  Hypoxia on 15 L of non repair either. Left arm swelling. History of cancer. EXAM: PORTABLE CHEST 1 VIEW COMPARISON:  Chest CT 06/26/2017, CXR 06/26/2017. FINDINGS: New large left-sided pneumothorax of roughly 50% of the left lung without mediastinal shift. Hyperinflated right lung with port catheter tip in the distal SVC from right-sided approach. Aortic atherosclerosis is noted. Heart size is normal. No acute osseous abnormality. IMPRESSION: 1. Large at least 50% pneumothorax on the left without mediastinal shift or tension. Critical Value/emergent results were called by telephone at the time of interpretation on 08/23/2017 at 10:39 pm to Dr. Arta Silence , who verbally acknowledged these results. 2. Pulmonary hyperinflation of the right lung with port catheter in place. 3. Aortic atherosclerosis. Electronically Signed   By: Ashley Royalty M.D.   On: 08/23/2017 22:39    Review of Systems  Constitutional: Negative for chills and fever.  HENT: Negative for sore throat and tinnitus.   Eyes: Negative for blurred vision and redness.  Respiratory: Positive for shortness of breath. Negative for cough.   Cardiovascular: Negative for chest pain, palpitations, orthopnea and PND.  Gastrointestinal: Negative for abdominal pain, diarrhea, nausea and vomiting.  Genitourinary: Negative for dysuria, frequency and urgency.  Musculoskeletal: Negative for joint pain and myalgias.  Skin: Negative for rash.       No lesions  Neurological: Negative for speech change, focal weakness and weakness.  Endo/Heme/Allergies: Does not bruise/bleed easily.       No temperature intolerance  Psychiatric/Behavioral: Negative for depression and suicidal ideas.    Blood pressure (!) 149/73, pulse 85, temperature 97.8 F (36.6 C), temperature source Rectal, resp. rate 13, weight 59 kg (130 lb), SpO2 98  %. Physical Exam  Vitals  reviewed. Constitutional: He is oriented to person, place, and time. He appears well-developed. He appears cachectic. No distress.  HENT:  Head: Normocephalic and atraumatic.  Mouth/Throat: Oropharynx is clear and moist.  Eyes: Conjunctivae and EOM are normal. Pupils are equal, round, and reactive to light. No scleral icterus.  Neck: Normal range of motion. Neck supple. No JVD present. No tracheal deviation present. No thyromegaly present.  Cardiovascular: Normal rate, regular rhythm and normal heart sounds. Exam reveals no gallop and no friction rub.  No murmur heard. Respiratory: Breath sounds normal. He is in respiratory distress (improving).  GI: Soft. Bowel sounds are normal. He exhibits no distension. There is no tenderness.  Genitourinary:  Genitourinary Comments: Deferred  Musculoskeletal: Normal range of motion. He exhibits no edema.  Lymphadenopathy:    He has no cervical adenopathy.  Neurological: He is alert and oriented to person, place, and time. No cranial nerve deficit.  Skin: Skin is warm and dry. No rash noted. No erythema.  Psychiatric: He has a normal mood and affect. His behavior is normal. Judgment and thought content normal.     Assessment/Plan This is a 71 year old male admitted for acute respiratory failure. 1.  Respiratory failure: Acute; secondary to pneumothorax.  Chest tube placed.  Mild hypoxia has resolved with oxygen via nasal cannula which is an improvement over nonrebreather facemask as the patient presented.  Continue supplemental O2 as needed.  Surgery to follow chest tube for removal.  The patient received antibiotics to cover for hospital-acquired pneumonia.  My review of his chest x-ray shows no indication of infiltrate or pneumonia.  Continuation of biotics at the discretion of the primary team. 2.  Hyponatremia: Secondary to chronic lung disease (COPD).  Hydrate with normal saline.  Encourage regular diet. 3.   Malnourishment: The patient is cachectic; BMI is 18.25.  Supplement diet with protein calories. 4.  Esophageal cancer: Metastatic; manage pain.  Care per oncology. 5.  DVT prophylaxis: Heparin 6.  GI prophylaxis: Pantoprazole per home regimen The patient is a DNR.  Time spent on admission orders and patient care approximately 45 minutes  Harrie Foreman, MD 08/24/2017, 2:42 AM

## 2017-08-24 NOTE — Consult Note (Signed)
Patient ID: Larry Wood, male   DOB: [birth year], 71 y.o.   MRN: [id number]  CC: Pneumothorax  HPI Larry Wood is a 71 y.o. male who is currently admitted to the internal medicine service with a left chest pneumothorax.  General surgery consult was requested by Dr. Benjie Karvonen for assistance with chest tube management.  Patient reports he was at a rehab facility where he started to feel ill and had increased work of breathing.  He called EMS and was brought to the ER was found to have a left-sided pneumothorax.  Left chest tube was placed by the ER provider.  Patient has a significant history of stage IV esophageal cancer and pelvic fractures.  Patient reports that his chest pain, increased work of breathing, feelings of respiratory demise have all resolved with the placement of the pneumothorax.  He has a general feeling of being not well depressed as he is also planning the funeral of his wife currently.  He denies any current fevers, chills, shortness of breath, nausea, vomiting.  He has chronic chest pain and pain at the chest tube site on the left.  He denies any constipation or diarrhea currently.  HPI  Past Medical History:  Diagnosis Date  . Anemia   . Cancer ([place])   . COPD (chronic obstructive pulmonary disease) ([place])     Past Surgical History:  Procedure Laterality Date  . ESOPHAGOGASTRODUODENOSCOPY (EGD) WITH PROPOFOL N/A 03/10/2017   Procedure: ESOPHAGOGASTRODUODENOSCOPY (EGD) WITH PROPOFOL;  Surgeon: Wilford Corner, MD;  Location: [facility] ENDOSCOPY;  Service: Endoscopy;  Laterality: N/A;  . ESOPHAGOGASTRODUODENOSCOPY (EGD) WITH PROPOFOL N/A 08/17/2017   Procedure: ESOPHAGOGASTRODUODENOSCOPY (EGD) WITH PROPOFOL WITH HEMOSPRAY;  Surgeon: Jonathon Bellows, MD;  Location: [facility] ENDOSCOPY;  Service: Gastroenterology;  Laterality: N/A;  . KNEE ARTHROSCOPY  1991  . PORTA CATH INSERTION N/A 03/21/2017   Procedure: Glori Luis Cath Insertion;  Surgeon: Algernon Huxley, MD;  Location: [place] [facility];  Service: Cardiovascular;  Laterality: N/A;  . TONSILLECTOMY      Family History  Problem Relation Age of Onset  . Other Daughter        overdose  Patient also has a brother with a history of heart disease floor of mouth cancer.  He denies any known family history of diabetes.  Social History Social History   Tobacco Use  . Smoking status: Former Smoker    Last attempt to quit: 06/2010    Years since quitting: 7.2  . Smokeless tobacco: Never Used  Substance Use Topics  . Alcohol use: No  . Drug use: No    No Known Allergies  Current Facility-Administered Medications  Medication Dose Route Frequency Provider Last Rate Last Dose  . 0.9 %  sodium chloride infusion   Intravenous Continuous Bettey Costa, MD 75 mL/hr at 08/24/17 1109    . acetaminophen (TYLENOL) tablet 650 mg  650 mg Oral Q6H PRN Harrie Foreman, MD       Or  . acetaminophen (TYLENOL) suppository 650 mg  650 mg Rectal Q6H PRN Harrie Foreman, MD      . ALPRAZolam Duanne Moron) tablet 0.25 mg  0.25 mg Oral TID PRN Harrie Foreman, MD   0.25 mg at 08/24/17 1549  . docusate sodium (COLACE) capsule 100 mg  100 mg Oral BID Harrie Foreman, MD   100 mg at 08/24/17 1106  . [COMPLETED] fentaNYL (SUBLIMAZE) injection 100 mcg  100 mcg Intravenous Once Arta Silence, MD   100 mcg at 08/23/17 2336  .  heparin injection 5,000 Units  5,000 Units Subcutaneous Q8H Harrie Foreman, MD   5,000 Units at 08/24/17 1325  . HYDROcodone-acetaminophen (NORCO/VICODIN) 5-325 MG per tablet 1-2 tablet  1-2 tablet Oral Q4H PRN Harrie Foreman, MD   2 tablet at 08/24/17 1731  . morphine (MS CONTIN) 12 hr tablet 30 mg  30 mg Oral Q12H Harrie Foreman, MD   30 mg at 08/24/17 1106  . morphine 2 MG/ML injection 2 mg  2 mg Intravenous Q4H PRN Saundra Shelling, MD   2 mg at 08/24/17 2004  . multivitamin with minerals tablet 1 tablet  1 tablet Oral Daily Bettey Costa, MD   1 tablet at 08/24/17 1106  . ondansetron (ZOFRAN) tablet 4 mg   4 mg Oral Q6H PRN Harrie Foreman, MD       Or  . ondansetron [facility]) injection 4 mg  4 mg Intravenous Q6H PRN Harrie Foreman, MD      . pantoprazole (PROTONIX) EC tablet 40 mg  40 mg Oral BID Harrie Foreman, MD   40 mg at 08/24/17 1106  . polyethylene glycol (MIRALAX / GLYCOLAX) packet 17 g  17 g Oral Daily PRN Harrie Foreman, MD       Facility-Administered Medications Ordered in Other Encounters  Medication Dose Route Frequency Provider Last Rate Last Dose  . sodium chloride flush (NS) 0.9 % injection 3 mL  3 mL Intracatheter PRN Sindy Guadeloupe, MD         Review of Systems A multi-point review of systems was asked and was negative except for the findings documented in the HPI Physical Exam Blood pressure (!) 98/45, pulse (!) 110, temperature (!) 97.4 F (36.3 C), temperature source Oral, resp. rate 20, height 5\' 10"  (1.778 m), weight 56.8 kg (125 lb 5 oz), SpO2 97 %. CONSTITUTIONAL: No acute distress but cachectic appearing. EYES: Pupils are equal, round, and reactive to light, Sclera are non-icteric. EARS, NOSE, MOUTH AND THROAT: The oropharynx is clear. The oral mucosa is pink and moist. Hearing is intact to voice. LYMPH NODES:  Lymph nodes in the neck are normal. RESPIRATORY:  Lungs are clear. There is normal respiratory effort, with equal breath sounds bilaterally, and without pathologic use of accessory muscles.  Left-sided chest tube in place with a Heimlich valve and a Pleur-evac.  No obvious air leak on exam.  Right upper chest Chemo-Port is currently accessed and infusing saline. CARDIOVASCULAR: Heart is regular without murmurs, gallops, or rubs. GI: The abdomen is soft, nontender, and nondistended. There are no palpable masses. There is no hepatosplenomegaly. There are normal bowel sounds in all quadrants. GU: Rectal deferred.   MUSCULOSKELETAL: Minimal edema to bilateral lower extremities. SKIN: Turgor is good and there are no pathologic skin lesions or  ulcers. NEUROLOGIC: Motor and sensation is grossly normal. Cranial nerves are grossly intact. PSYCH:  Oriented to person, place and time. Affect is normal.  Data Reviewed Labs reviewed which showed a massive pneumothorax on presentation and near complete resolution postplacement x-ray.  Recent x-ray from earlier today appears to show complete resolution. I have personally reviewed the patient's imaging, laboratory findings and medical records.    Assessment    Left-sided pneumothorax    Plan    71 year old male with multiple medical problems and a left-sided pneumothorax with chest tube in place.  Chest tube is currently not on suction.  Discussed with the patient that so long as his chest x-ray looks good tomorrow morning with  no continued air leak within the tube will likely be able to be removed.  Patient voiced understanding.  He has numerous medical conditions and appears to be showing signs of depression which is appropriate considering what is going on with his life.  He voiced he does not wish to go back to any rehab facility.  He is interested in hospice per currently.  General surgery will follow along until the chest tube was removed.     Time spent with the patient was 55 minutes, with more than 50% of the time spent in face-to-face education, counseling and care coordination.     Clayburn Pert, MD FACS General Surgeon 08/24/2017, 8:11 PM

## 2017-08-25 ENCOUNTER — Inpatient Hospital Stay: Payer: PPO

## 2017-08-25 DIAGNOSIS — J9312 Secondary spontaneous pneumothorax: Secondary | ICD-10-CM

## 2017-08-25 LAB — BASIC METABOLIC PANEL
ANION GAP: 4 — AB (ref 5–15)
BUN: 14 mg/dL (ref 6–20)
CALCIUM: 7.9 mg/dL — AB (ref 8.9–10.3)
CO2: 26 mmol/L (ref 22–32)
Chloride: 105 mmol/L (ref 101–111)
Creatinine, Ser: 0.43 mg/dL — ABNORMAL LOW (ref 0.61–1.24)
GFR calc non Af Amer: >60 mL/min (ref >60)
Glucose, Bld: 92 mg/dL (ref 65–99)
Potassium: 3.6 mmol/L (ref 3.5–5.1)
Sodium: 135 mmol/L (ref 135–145)

## 2017-08-25 MED ORDER — MORPHINE SULFATE (PF) 2 MG/ML IV SOLN
2.0000 mg | Freq: Once | INTRAVENOUS | Status: AC
  Administered 2017-08-25: 14:00:00 2 mg via INTRAVENOUS

## 2017-08-25 NOTE — Progress Notes (Signed)
[facility] [place] NAME: Larry Wood    MR#:  [id number]  DATE OF BIRTH:  06/13/1946  SUBJECTIVE:   Patient is doing better this point. Denies shortness of breath. Patient wants to go home at discharge she does not want to go to rehabilitation center. He was to home with hospice.  REVIEW OF SYSTEMS:    Review of Systems  Constitutional: Negative for fever, chills weight loss HENT: Negative for ear pain, nosebleeds, congestion, facial swelling, rhinorrhea, neck pain, neck stiffness and ear discharge.   Respiratory: Negative for cough, shortness of breath, wheezing  Cardiovascular: Negative for chest pain, palpitations and leg swelling.  Gastrointestinal: Negative for heartburn, abdominal pain, vomiting, diarrhea or consitpation Genitourinary: Negative for dysuria, urgency, frequency, hematuria Musculoskeletal: Negative for back pain or joint pain Neurological: Negative for dizziness, seizures, syncope, focal weakness,  numbness and headaches.  Hematological: Does not bruise/bleed easily.  Psychiatric/Behavioral: Dysphoric mood    Tolerating Diet: yes      DRUG ALLERGIES:  No Known Allergies  VITALS:  Blood pressure 137/73, pulse 96, temperature (!) 97.5 F (36.4 C), temperature source Oral, resp. rate (!) 23, height 5\' 10"  (1.778 m), weight 60.4 kg (133 lb 4 oz), SpO2 95 %.  PHYSICAL EXAMINATION:  Constitutional: Appears thin frail and cachectic  HENT: Normocephalic. Marland Kitchen Oropharynx is clear and moist.  Eyes: Conjunctivae and EOM are normal. PERRLA, no scleral icterus.  Neck: Normal ROM. Neck supple. No JVD. No tracheal deviation. CVS: RRR, S1/S2 +, no murmurs, no gallops, no carotid bruit.  Pulmonary: Effort and breath sounds normal, no stridor, rhonchi, wheezes, rales.  Chest tube placed Abdominal: Soft. BS +,  no distension, tenderness, rebound or guarding.  Musculoskeletal: Normal range of motion. No edema and no  tenderness.  Neuro: Alert. CN 2-12 grossly intact. No focal deficits. Skin: Skin is warm and dry. No rash noted. Psychiatric: Dysphoric mood    LABORATORY PANEL:   CBC Recent Labs  Lab 08/23/17 2137  WBC 11.2*  HGB 8.4*  HCT 26.1*  PLT 360   ------------------------------------------------------------------------------------------------------------------  Chemistries  Recent Labs  Lab 08/23/17 2137 08/25/17 0544  NA 133* 135  K 4.0 3.6  CL 100* 105  CO2 23 26  GLUCOSE 192* 92  BUN 17 14  CREATININE 0.61 0.43*  CALCIUM 8.1* 7.9*  AST 32  --   ALT 15*  --   ALKPHOS 112  --   BILITOT 0.5  --    ------------------------------------------------------------------------------------------------------------------  Cardiac Enzymes Recent Labs  Lab 08/22/17 0429 08/22/17 1240 08/23/17 2137  TROPONINI <0.03 <0.03 <0.03   ------------------------------------------------------------------------------------------------------------------  RADIOLOGY:  Dg Chest 1 View  Result Date: 08/24/2017 CLINICAL DATA:  Shortness of breath. EXAM: CHEST 1 VIEW COMPARISON:  08/23/2017. FINDINGS: PowerPort catheter with tip over the superior vena cava. Heart size normal. No pulmonary venous congestion. COPD. No focal infiltrate. Small bilateral pleural effusions. No pneumothorax . IMPRESSION: 1. PowerPort catheter noted with tip projected over superior vena cava. 2. COPD. Mild basilar atelectasis and small bilateral pleural effusions. Electronically Signed   By: Marcello Moores  Register   On: 08/24/2017 11:45   Dg Chest 2 View  Result Date: 08/25/2017 CLINICAL DATA:  Sepsis and urinary tract infection. COPD. Recent left pneumothorax. Esophageal carcinoma with liver metastases. EXAM: CHEST  2 VIEW COMPARISON:  08/24/2017 FINDINGS: The heart size and mediastinal contours are within normal limits. Aortic atherosclerosis. Right-sided power port remains in appropriate position. A left pleural catheter  remains in place.  No pneumothorax visualized. Pulmonary hyperinflation, consistent with COPD. No evidence of pulmonary consolidation. Mild atelectasis in medial left lower lobe appears stable. Small bilateral pleural effusions, left side greater than right, show no significant change. IMPRESSION: No significant change in mild left basilar atelectasis and tiny bilateral pleural effusions. COPD.  No pneumothorax visualized. Electronically Signed   By: Earle Gell M.D.   On: 08/25/2017 09:04   Dg Chest Portable 1 View  Result Date: 08/23/2017 CLINICAL DATA:  Left chest tube placement. EXAM: PORTABLE CHEST 1 VIEW COMPARISON:  2218 hours on the same day FINDINGS: Re-expansion of the left lung status post left-sided chest tube placement. The tip of the chest tube drapes over the left lung apex and terminates adjacent to the aortic arch. There is a small peripheral pneumothorax noted along the mid left lung measuring up to 7 mm in thickness. The lungs are hyperinflated. Heart and mediastinal contours are stable. Port catheter tip terminates in the distal SVC. There is aortic atherosclerosis without aneurysm. No acute osseous abnormality. IMPRESSION: New left-sided chest tube with tiny peripheral residual pneumothorax seen along the left mid lung measuring up to 7 mm in thickness. Electronically Signed   By: Ashley Royalty M.D.   On: 08/23/2017 23:38   Dg Chest Portable 1 View  Result Date: 08/23/2017 CLINICAL DATA:  Hypoxia on 15 L of non repair either. Left arm swelling. History of cancer. EXAM: PORTABLE CHEST 1 VIEW COMPARISON:  Chest CT 06/26/2017, CXR 06/26/2017. FINDINGS: New large left-sided pneumothorax of roughly 50% of the left lung without mediastinal shift. Hyperinflated right lung with port catheter tip in the distal SVC from right-sided approach. Aortic atherosclerosis is noted. Heart size is normal. No acute osseous abnormality. IMPRESSION: 1. Large at least 50% pneumothorax on the left without  mediastinal shift or tension. Critical Value/emergent results were called by telephone at the time of interpretation on 08/23/2017 at 10:39 pm to Dr. Arta Silence , who verbally acknowledged these results. 2. Pulmonary hyperinflation of the right lung with port catheter in place. 3. Aortic atherosclerosis. Electronically Signed   By: Ashley Royalty M.D.   On: 08/23/2017 22:39     ASSESSMENT AND PLAN:   71 year old male who was discharged yesterday after pelvic fracture and discharged to skilled nursing facility who presents with shortness of breath and found to have pneumothorax.   1. Pneumothorax: Patient has chest tube placed. Today's chest x-ray looks like resolution pneumothorax and likely chest tube can be discontinued. Await surgery follow up   2. Hyponatremia due to dehydration Sodium level has improved.  3. Recent pelvic fracture: Continue pain management  Patient will go home with hospice tomorrow.  4. Protein calorie malnutrition: Continue protein supplements.  5. stage IV gastroesophageal junction carcinoma: Continue pain management  6. Acute grief reaction: Patient is emotional due to his wife passing away recently.   Management plans discussed with the patient and he is in agreement.  CODE STATUS: dnr  TOTAL TIME TAKING CARE OF THIS PATIENT: 24 minutes.     POSSIBLE D/C tomorrow, DEPENDING ON pneumothorax.  Mychele Seyller M.D on 08/25/2017 at 10:23 AM  Between 7am to 6pm - Pager - [phone] After 6pm go to [email or web address] - password [email or web address] [facility]  Office  [phone]  CC: Primary care physician; Derinda Late, MD  Note: This dictation was prepared with Dragon dictation along with smaller phrase technology. Any transcriptional errors that result from this process are unintentional.

## 2017-08-25 NOTE — Progress Notes (Signed)
CC: PTX Subjective: Doing well, no SOB Minimal output CT No air leak CXR pers. Reviewed, no ptx  Objective: Vital signs in last 24 hours: Temp:  [97.3 F (36.3 C)-97.5 F (36.4 C)] 97.5 F (36.4 C) (11/24 0515) Pulse Rate:  [96-110] 96 (11/24 0515) Resp:  [20-23] 23 (11/24 0515) BP: (98-137)/(45-73) 137/73 (11/24 0515) SpO2:  [95 %-99 %] 95 % (11/24 0515) Weight:  [60.4 kg (133 lb 4 oz)] 60.4 kg (133 lb 4 oz) (11/24 0515) Last BM Date: 08/21/17  Intake/Output from previous day: 11/23 0701 - 11/24 0700 In: 2113.8 [P.O.:240; I.V.:1873.8] Out: 300 [Urine:300] Intake/Output this shift: Total I/O In: 480 [P.O.:480] Out: 10 [Chest Tube:10]  Physical exam: NAD, debilitated Chest: CTA, CT in place, removed. Lungs CTA. NSR Abd: soft, NT Ext: well perfused and warm  Lab Results: CBC  Recent Labs    08/23/17 2137  WBC 11.2*  HGB 8.4*  HCT 26.1*  PLT 360   BMET Recent Labs    08/23/17 2137 08/25/17 0544  NA 133* 135  K 4.0 3.6  CL 100* 105  CO2 23 26  GLUCOSE 192* 92  BUN 17 14  CREATININE 0.61 0.43*  CALCIUM 8.1* 7.9*   PT/INR Recent Labs    08/23/17 2137  LABPROT 13.4  INR 1.03   ABG No results for input(s): PHART, HCO3 in the last 72 hours.  Invalid input(s): PCO2, PO2  Studies/Results: Dg Chest 1 View  Result Date: 08/24/2017 CLINICAL DATA:  Shortness of breath. EXAM: CHEST 1 VIEW COMPARISON:  08/23/2017. FINDINGS: PowerPort catheter with tip over the superior vena cava. Heart size normal. No pulmonary venous congestion. COPD. No focal infiltrate. Small bilateral pleural effusions. No pneumothorax . IMPRESSION: 1. PowerPort catheter noted with tip projected over superior vena cava. 2. COPD. Mild basilar atelectasis and small bilateral pleural effusions. Electronically Signed   By: Marcello Moores  Register   On: 08/24/2017 11:45   Dg Chest 2 View  Result Date: 08/25/2017 CLINICAL DATA:  Sepsis and urinary tract infection. COPD. Recent left pneumothorax.  Esophageal carcinoma with liver metastases. EXAM: CHEST  2 VIEW COMPARISON:  08/24/2017 FINDINGS: The heart size and mediastinal contours are within normal limits. Aortic atherosclerosis. Right-sided power port remains in appropriate position. A left pleural catheter remains in place. No pneumothorax visualized. Pulmonary hyperinflation, consistent with COPD. No evidence of pulmonary consolidation. Mild atelectasis in medial left lower lobe appears stable. Small bilateral pleural effusions, left side greater than right, show no significant change. IMPRESSION: No significant change in mild left basilar atelectasis and tiny bilateral pleural effusions. COPD.  No pneumothorax visualized. Electronically Signed   By: Earle Gell M.D.   On: 08/25/2017 09:04   Dg Chest Portable 1 View  Result Date: 08/23/2017 CLINICAL DATA:  Left chest tube placement. EXAM: PORTABLE CHEST 1 VIEW COMPARISON:  2218 hours on the same day FINDINGS: Re-expansion of the left lung status post left-sided chest tube placement. The tip of the chest tube drapes over the left lung apex and terminates adjacent to the aortic arch. There is a small peripheral pneumothorax noted along the mid left lung measuring up to 7 mm in thickness. The lungs are hyperinflated. Heart and mediastinal contours are stable. Port catheter tip terminates in the distal SVC. There is aortic atherosclerosis without aneurysm. No acute osseous abnormality. IMPRESSION: New left-sided chest tube with tiny peripheral residual pneumothorax seen along the left mid lung measuring up to 7 mm in thickness. Electronically Signed   By: Meredith Leeds.D.  On: 08/23/2017 23:38   Dg Chest Portable 1 View  Result Date: 08/23/2017 CLINICAL DATA:  Hypoxia on 15 L of non repair either. Left arm swelling. History of cancer. EXAM: PORTABLE CHEST 1 VIEW COMPARISON:  Chest CT 06/26/2017, CXR 06/26/2017. FINDINGS: New large left-sided pneumothorax of roughly 50% of the left lung without  mediastinal shift. Hyperinflated right lung with port catheter tip in the distal SVC from right-sided approach. Aortic atherosclerosis is noted. Heart size is normal. No acute osseous abnormality. IMPRESSION: 1. Large at least 50% pneumothorax on the left without mediastinal shift or tension. Critical Value/emergent results were called by telephone at the time of interpretation on 08/23/2017 at 10:39 pm to Dr. Arta Silence , who verbally acknowledged these results. 2. Pulmonary hyperinflation of the right lung with port catheter in place. 3. Aortic atherosclerosis. Electronically Signed   By: Ashley Royalty M.D.   On: 08/23/2017 22:39    Anti-infectives: Anti-infectives (From admission, onward)   Start     Dose/Rate Route Frequency Ordered Stop   08/23/17 2330  ceFEPIme (MAXIPIME) 2 g in dextrose 5 % 50 mL IVPB     2 g 100 mL/hr over 30 Minutes Intravenous  Once 08/23/17 2318 08/24/17 0021      Assessment/Plan: Resolved ptx CT out pulm toilet  Caroleen Hamman, MD, FACS  08/25/2017

## 2017-08-25 NOTE — Progress Notes (Signed)
Procedure Note 1.Placement of Left 14 FR wayne pneumothorax catheter 2. Intercostal nerve block 4, 5th IC bundles  Patient seen and examined was found respiratory distress and a chest history confirm a recurrent left pneumothorax. Patient left chest was prepped and draped in the usual sterile fashion and using the lidocaine 1% we perform an intercostal nerve block in the standard fashion. Using the needle guide we entered the pleural space and aspirated air. Using the Seldinger technique and the wire were able to place the 14 French catheter using the modified Seldinger technique after dilating the tract. The catheter was secured in place with a silk suture and attached to the Pleur-evac on suction. No immediate complications Patient is critically ill and I am concerned about his overall medical condition. The medical team is aware of this.

## 2017-08-25 NOTE — Care Management Note (Addendum)
Case Management Note  Patient Details  Name: JERMONE GEISTER MRN: [id number] Date of Birth: [birth year]  Subjective/Objective:         Mr Habenicht is now refusing to return to [facility] and is requesting to return to his home with [facility]. Wanda at [facility] was notified and a referral was faxed to her. Anticipate discharge home tomorrow with Hospice to provide care at Mr Cathcart home.            Action/Plan:   Expected Discharge Date:                  Expected Discharge Plan:     In-House Referral:     Discharge planning Services     Post Acute Care Choice:    Choice offered to:     DME Arranged:    DME Agency:     HH Arranged:    HH Agency:     Status of Service:     If discussed at [facility] of Stay Meetings, dates discussed:    Additional Comments:  Fredrick Dray A, RN 08/25/2017, 1:58 PM

## 2017-08-25 NOTE — Clinical Social Work Note (Signed)
CSW updated by the attending MD that the patient has chosen to discharge home with home hospice. The CSW has alerted the [facility]. CSW is signing off. Please consult should needs arise.  Santiago Bumpers, MSW, Latanya Presser [phone]

## 2017-08-25 NOTE — Significant Event (Signed)
Rapid Response Event Note  Overview: Time Called: 1332 Arrival Time: 0240 Event Type: Respiratory  Initial Focused Assessment: pt laying in bed, resp distress on non rebreather. Chest tube was pulled this morning.   Interventions: CXR- lung recollapsed, Dr Dahlia Byes to bedside to reinsert chest tube  Plan of Care (if not transferred): Megan to call if further assistance needed  Event Summary: Name of Physician Notified: [place] at 1334    at    Outcome: Stayed in room and stabalized  Event End Time: [place]

## 2017-08-26 ENCOUNTER — Inpatient Hospital Stay: Payer: PPO

## 2017-08-26 LAB — BASIC METABOLIC PANEL
Anion gap: 5 (ref 5–15)
BUN: 13 mg/dL (ref 6–20)
CHLORIDE: 104 mmol/L (ref 101–111)
CO2: 26 mmol/L (ref 22–32)
CREATININE: 0.43 mg/dL — AB (ref 0.61–1.24)
Calcium: 7.9 mg/dL — ABNORMAL LOW (ref 8.9–10.3)
GFR calc Af Amer: >60 mL/min (ref >60)
GFR calc non Af Amer: >60 mL/min (ref >60)
Glucose, Bld: 85 mg/dL (ref 65–99)
POTASSIUM: 3.6 mmol/L (ref 3.5–5.1)
SODIUM: 135 mmol/L (ref 135–145)

## 2017-08-26 LAB — MAGNESIUM: MAGNESIUM: 1.5 mg/dL — AB (ref 1.7–2.4)

## 2017-08-26 MED ORDER — BOOST / RESOURCE BREEZE PO LIQD
1.0000 | Freq: Three times a day (TID) | ORAL | Status: DC
  Administered 2017-08-26 – 2017-08-28 (×3): 1 via ORAL

## 2017-08-26 NOTE — Care Management Note (Signed)
Case Management Note  Patient Details  Name: Larry Wood MRN: [id number] Date of Birth: [birth year]  Subjective/Objective:        Barrier to Discharge: recurrence of pneumothorax. CT to suction for another 48 hours ordered by cardiology. Per family request a referral for hospice to follow [name] at home was faxed and called to Lorelle Formosa at [facility].             Action/Plan:   Expected Discharge Date:                  Expected Discharge Plan:  Home w Hospice Care  In-House Referral:     Discharge planning Services  CM Consult  Post Acute Care Choice:  Hospice Choice offered to:  Patient  DME Arranged:    DME Agency:     HH Arranged:    [place] Agency:  [facility]  Status of Service:  Completed, signed off  If discussed at [place] of Stay Meetings, dates discussed:    Additional Comments:  Taner Rzepka A, RN 08/26/2017, 12:38 PM

## 2017-08-26 NOTE — Progress Notes (Signed)
CC: recurrent PTX Subjective: Doing better Dyspnea improved CXR pers reviewed, no ptx  Objective: Vital signs in last 24 hours: Temp:  [98 F (36.7 C)-98.3 F (36.8 C)] 98.1 F (36.7 C) (11/25 1159) Pulse Rate:  [90-102] 102 (11/25 1159) Resp:  [14-18] 18 (11/25 1159) BP: (122-150)/(63-74) 140/74 (11/25 1159) SpO2:  [90 %-100 %] 100 % (11/25 1159) Weight:  [62.4 kg (137 lb 8 oz)] 62.4 kg (137 lb 8 oz) (11/25 0500) Last BM Date: 08/24/17  Intake/Output from previous day: 11/24 0701 - 11/25 0700 In: 2788.8 [P.O.:720; I.V.:2068.8] Out: 1085 [Urine:1075; Chest Tube:10] Intake/Output this shift: Total I/O In: 240 [P.O.:240] Out: -   Physical exam: debilitated male Chest: well aerated lungs, CT in place , no leak Abd: soft, nt Ext: no edema, well perfused   Lab Results: CBC  Recent Labs    08/23/17 2137  WBC 11.2*  HGB 8.4*  HCT 26.1*  PLT 360   BMET Recent Labs    08/25/17 0544 08/26/17 0735  NA 135 135  K 3.6 3.6  CL 105 104  CO2 26 26  GLUCOSE 92 85  BUN 14 13  CREATININE 0.43* 0.43*  CALCIUM 7.9* 7.9*   PT/INR Recent Labs    08/23/17 2137  LABPROT 13.4  INR 1.03   ABG No results for input(s): PHART, HCO3 in the last 72 hours.  Invalid input(s): PCO2, PO2  Studies/Results: Dg Chest 2 View  Result Date: 08/25/2017 CLINICAL DATA:  Sepsis and urinary tract infection. COPD. Recent left pneumothorax. Esophageal carcinoma with liver metastases. EXAM: CHEST  2 VIEW COMPARISON:  08/24/2017 FINDINGS: The heart size and mediastinal contours are within normal limits. Aortic atherosclerosis. Right-sided power port remains in appropriate position. A left pleural catheter remains in place. No pneumothorax visualized. Pulmonary hyperinflation, consistent with COPD. No evidence of pulmonary consolidation. Mild atelectasis in medial left lower lobe appears stable. Small bilateral pleural effusions, left side greater than right, show no significant change.  IMPRESSION: No significant change in mild left basilar atelectasis and tiny bilateral pleural effusions. COPD.  No pneumothorax visualized. Electronically Signed   By: Earle Gell M.D.   On: 08/25/2017 09:04   Dg Chest Port 1 View  Result Date: 08/26/2017 CLINICAL DATA:  Pneumothorax. EXAM: PORTABLE CHEST 1 VIEW COMPARISON:  Radiograph of August 25, 2017. FINDINGS: The heart size and mediastinal contours are within normal limits. Atherosclerosis of thoracic aorta is noted. Right internal jugular Port-A-Cath is unchanged in position. Left-sided chest tube is unchanged in position without definite pneumothorax. Stable left basilar atelectasis is noted with mild associated pleural effusion. Stable mild subcutaneous emphysema is seen over left lateral chest wall. The visualized skeletal structures are unremarkable. IMPRESSION: Stable position of left-sided chest tube without definite pneumothorax. Aortic atherosclerosis. Stable left basilar atelectasis is noted with associated pleural effusion. Electronically Signed   By: Marijo Conception, M.D.   On: 08/26/2017 07:36   Dg Chest Port 1 View  Result Date: 08/25/2017 CLINICAL DATA:  Chest tube placement. EXAM: PORTABLE CHEST 1 VIEW COMPARISON:  08/25/2017 at 1323 hours FINDINGS: A right jugular Port-A-Cath terminates over the lower SVC. A new left pigtail pleural catheter terminates over the left lung apex. The cardiomediastinal silhouette is unchanged. Aortic atherosclerosis is noted. The lungs are hyperinflated. External material creates artifact over the left lung apex. Within this limitation, no definite residual pneumothorax is identified. There are persistent small bilateral pleural effusions with minimal atelectasis in the left lung base. IMPRESSION: No definite residual pneumothorax identified  following left chest tube placement. Electronically Signed   By: Logan Bores M.D.   On: 08/25/2017 14:12   Dg Chest Port 1 View  Result Date:  08/25/2017 CLINICAL DATA:  Chest tube removed. Now with extreme shortness of breath. EXAM: PORTABLE CHEST 1 VIEW COMPARISON:  08/25/2017 FINDINGS: Patient developed a large left pneumothorax measuring roughly 50% since removal of the a left chest tube. No evidence for mediastinal shift. Right lung is clear with mild blunting at the right costophrenic angle. There is a right jugular Port-A-Cath with the tip in the SVC. Heart size is stable. IMPRESSION: New large left pneumothorax following removal of the chest tube. These results were called by telephone at the time of interpretation on 08/25/2017 at 1:41 pm to the patient's nurse, Meghan, who verbally acknowledged these results and acknowledged that Dr. Dahlia Byes was already aware of the findings. Electronically Signed   By: Markus Daft M.D.   On: 08/25/2017 13:43    Anti-infectives: Anti-infectives (From admission, onward)   Start     Dose/Rate Route Frequency Ordered Stop   08/23/17 2330  ceFEPIme (MAXIPIME) 2 g in dextrose 5 % 50 mL IVPB     2 g 100 mL/hr over 30 Minutes Intravenous  Once 08/23/17 2318 08/24/17 0021      Assessment/Plan:  Keep CT for 48rhs on suction No surgical intervention continue to follow  Caroleen Hamman, MD, FACS  08/26/2017

## 2017-08-26 NOTE — Progress Notes (Signed)
[facility] [place] NAME: Larry Wood    MR#:  [id number]  DATE OF BIRTH:  17-May-1946  SUBJECTIVE:   Chest tube was discontinued yesterday however patient started having shortness of breath and chest x-ray showed reoccurrence of pneumothorax.  REVIEW OF SYSTEMS:    Review of Systems  Constitutional: Negative for fever, chills weight loss HENT: Negative for ear pain, nosebleeds, congestion, facial swelling, rhinorrhea, neck pain, neck stiffness and ear discharge.   Respiratory: Negative for cough, shortness of breath, wheezing  Cardiovascular: Negative for chest pain, palpitations and leg swelling.  Gastrointestinal: Negative for heartburn, abdominal pain, vomiting, diarrhea or consitpation Genitourinary: Negative for dysuria, urgency, frequency, hematuria Musculoskeletal: Negative for back pain or joint pain Neurological: Negative for dizziness, seizures, syncope, focal weakness,  numbness and headaches.  Hematological: Does not bruise/bleed easily.  Psychiatric/Behavioral: Dysphoric mood    Tolerating Diet: yes      DRUG ALLERGIES:  No Known Allergies  VITALS:  Blood pressure (!) 144/73, pulse 98, temperature 98 F (36.7 C), temperature source Oral, resp. rate 14, height 5\' 10"  (1.778 m), weight 62.4 kg (137 lb 8 oz), SpO2 96 %.  PHYSICAL EXAMINATION:  Constitutional: Appears thin frail and cachectic  HENT: Normocephalic. Marland Kitchen Oropharynx is clear and moist.  Eyes: Conjunctivae and EOM are normal. PERRLA, no scleral icterus.  Neck: Normal ROM. Neck supple. No JVD. No tracheal deviation. CVS: RRR, S1/S2 +, no murmurs, no gallops, no carotid bruit.  Pulmonary: Effort and breath sounds normal, no stridor, rhonchi, wheezes, rales.  Chest tube placed Abdominal: Soft. BS +,  no distension, tenderness, rebound or guarding.  Musculoskeletal: Normal range of motion. No edema and no tenderness.  Neuro: Alert. CN 2-12 grossly intact. No  focal deficits. Skin: Skin is warm and dry. No rash noted. Psychiatric: Dysphoric mood    LABORATORY PANEL:   CBC Recent Labs  Lab 08/23/17 2137  WBC 11.2*  HGB 8.4*  HCT 26.1*  PLT 360   ------------------------------------------------------------------------------------------------------------------  Chemistries  Recent Labs  Lab 08/23/17 2137  08/26/17 0735  NA 133*   < > 135  K 4.0   < > 3.6  CL 100*   < > 104  CO2 23   < > 26  GLUCOSE 192*   < > 85  BUN 17   < > 13  CREATININE 0.61   < > 0.43*  CALCIUM 8.1*   < > 7.9*  MG  --   --  1.5*  AST 32  --   --   ALT 15*  --   --   ALKPHOS 112  --   --   BILITOT 0.5  --   --    < > = values in this interval not displayed.   ------------------------------------------------------------------------------------------------------------------  Cardiac Enzymes Recent Labs  Lab 08/22/17 0429 08/22/17 1240 08/23/17 2137  TROPONINI <0.03 <0.03 <0.03   ------------------------------------------------------------------------------------------------------------------  RADIOLOGY:  Dg Chest 1 View  Result Date: 08/24/2017 CLINICAL DATA:  Shortness of breath. EXAM: CHEST 1 VIEW COMPARISON:  08/23/2017. FINDINGS: PowerPort catheter with tip over the superior vena cava. Heart size normal. No pulmonary venous congestion. COPD. No focal infiltrate. Small bilateral pleural effusions. No pneumothorax . IMPRESSION: 1. PowerPort catheter noted with tip projected over superior vena cava. 2. COPD. Mild basilar atelectasis and small bilateral pleural effusions. Electronically Signed   By: Marcello Moores  Register   On: 08/24/2017 11:45   Dg Chest 2 View  Result Date: 08/25/2017 CLINICAL DATA:  Sepsis and urinary tract infection. COPD. Recent left pneumothorax. Esophageal carcinoma with liver metastases. EXAM: CHEST  2 VIEW COMPARISON:  08/24/2017 FINDINGS: The heart size and mediastinal contours are within normal limits. Aortic atherosclerosis.  Right-sided power port remains in appropriate position. A left pleural catheter remains in place. No pneumothorax visualized. Pulmonary hyperinflation, consistent with COPD. No evidence of pulmonary consolidation. Mild atelectasis in medial left lower lobe appears stable. Small bilateral pleural effusions, left side greater than right, show no significant change. IMPRESSION: No significant change in mild left basilar atelectasis and tiny bilateral pleural effusions. COPD.  No pneumothorax visualized. Electronically Signed   By: Earle Gell M.D.   On: 08/25/2017 09:04   Dg Chest Port 1 View  Result Date: 08/26/2017 CLINICAL DATA:  Pneumothorax. EXAM: PORTABLE CHEST 1 VIEW COMPARISON:  Radiograph of August 25, 2017. FINDINGS: The heart size and mediastinal contours are within normal limits. Atherosclerosis of thoracic aorta is noted. Right internal jugular Port-A-Cath is unchanged in position. Left-sided chest tube is unchanged in position without definite pneumothorax. Stable left basilar atelectasis is noted with mild associated pleural effusion. Stable mild subcutaneous emphysema is seen over left lateral chest wall. The visualized skeletal structures are unremarkable. IMPRESSION: Stable position of left-sided chest tube without definite pneumothorax. Aortic atherosclerosis. Stable left basilar atelectasis is noted with associated pleural effusion. Electronically Signed   By: Marijo Conception, M.D.   On: 08/26/2017 07:36   Dg Chest Port 1 View  Result Date: 08/25/2017 CLINICAL DATA:  Chest tube placement. EXAM: PORTABLE CHEST 1 VIEW COMPARISON:  08/25/2017 at 1323 hours FINDINGS: A right jugular Port-A-Cath terminates over the lower SVC. A new left pigtail pleural catheter terminates over the left lung apex. The cardiomediastinal silhouette is unchanged. Aortic atherosclerosis is noted. The lungs are hyperinflated. External material creates artifact over the left lung apex. Within this limitation, no  definite residual pneumothorax is identified. There are persistent small bilateral pleural effusions with minimal atelectasis in the left lung base. IMPRESSION: No definite residual pneumothorax identified following left chest tube placement. Electronically Signed   By: Logan Bores M.D.   On: 08/25/2017 14:12   Dg Chest Port 1 View  Result Date: 08/25/2017 CLINICAL DATA:  Chest tube removed. Now with extreme shortness of breath. EXAM: PORTABLE CHEST 1 VIEW COMPARISON:  08/25/2017 FINDINGS: Patient developed a large left pneumothorax measuring roughly 50% since removal of the a left chest tube. No evidence for mediastinal shift. Right lung is clear with mild blunting at the right costophrenic angle. There is a right jugular Port-A-Cath with the tip in the SVC. Heart size is stable. IMPRESSION: New large left pneumothorax following removal of the chest tube. These results were called by telephone at the time of interpretation on 08/25/2017 at 1:41 pm to the patient's nurse, Meghan, who verbally acknowledged these results and acknowledged that Dr. Dahlia Byes was already aware of the findings. Electronically Signed   By: Markus Daft M.D.   On: 08/25/2017 13:43     ASSESSMENT AND PLAN:   71 year old male who was discharged yesterday after pelvic fracture and discharged to skilled nursing facility who presents with shortness of breath and found to have pneumothorax.   1. Pneumothorax: Chest tube was discontinued however shortly after patient had another episode of shortness of breath and found to have recurrence of the pneumothorax. Chest tube management as per surgery   2. Hyponatremia due to dehydration Sodium level has improved.  3. Recent pelvic fracture: Continue pain management  Patient  will go home with hospice. He does not want to go to skilled nursing facility  4. Protein calorie malnutrition: Continue protein supplements.  5. stage IV gastroesophageal junction carcinoma: Continue pain  management  6. Acute grief reaction: Patient is emotional due to his wife passing away recently.   Management plans discussed with the patient and he is in agreement.  CODE STATUS: dnr  TOTAL TIME TAKING CARE OF THIS PATIENT: 24 minutes.     POSSIBLE D/C tomorrow, DEPENDING ON pneumothorax.  Nolene Rocks M.D on 08/26/2017 at 10:39 AM  Between 7am to 6pm - Pager - [phone] After 6pm go to [email or web address] - password [email or web address] [facility]  Office  [phone]  CC: Primary care physician; Derinda Late, MD  Note: This dictation was prepared with Dragon dictation along with smaller phrase technology. Any transcriptional errors that result from this process are unintentional.

## 2017-08-27 ENCOUNTER — Encounter: Payer: Self-pay | Admitting: *Deleted

## 2017-08-27 ENCOUNTER — Inpatient Hospital Stay: Payer: PPO

## 2017-08-27 DIAGNOSIS — J9383 Other pneumothorax: Secondary | ICD-10-CM

## 2017-08-27 MED ORDER — FUROSEMIDE 10 MG/ML IJ SOLN
20.0000 mg | Freq: Once | INTRAMUSCULAR | Status: AC
  Administered 2017-08-27: 12:00:00 20 mg via INTRAVENOUS
  Filled 2017-08-27: qty 2

## 2017-08-27 MED ORDER — MAGNESIUM SULFATE 2 GM/50ML IV SOLN
2.0000 g | Freq: Once | INTRAVENOUS | Status: AC
  Administered 2017-08-27: 2 g via INTRAVENOUS
  Filled 2017-08-27: qty 50

## 2017-08-27 MED ORDER — SODIUM CHLORIDE 0.9% FLUSH: 10.0000 mL | INTRAVENOUS | Status: DC | PRN

## 2017-08-27 MED ORDER — METHYLPREDNISOLONE SODIUM SUCC 125 MG IJ SOLR
60.0000 mg | INTRAMUSCULAR | Status: DC
  Administered 2017-08-27: 13:00:00 60 mg via INTRAVENOUS
  Filled 2017-08-27: qty 2

## 2017-08-27 NOTE — Care Management Important Message (Signed)
Important Message  Patient Details  Name: Larry Wood MRN: [id number] Date of Birth: 01-11-1946   Medicare Important Message Given:  Yes    Shelbie Ammons, RN 08/27/2017, 8:29 AM

## 2017-08-27 NOTE — Progress Notes (Signed)
[name] NAME: Larry Wood    MR#:  [id number]  DATE OF BIRTH:  1946-07-22  SUBJECTIVE:   Patient c/o SOB this morning.  History [name] is in the hospital and has a broken hip  REVIEW OF SYSTEMS:    Review of Systems  Constitutional: Negative for fever, chills weight loss Positive generalized weakness HENT: Negative for ear pain, nosebleeds, congestion, facial swelling, rhinorrhea, neck pain, neck stiffness and ear discharge.   Respiratory: Positive for increasing shortness of breath. Cardiovascular: Negative for chest pain, palpitations  positive for arm and leg swelling Gastrointestinal: Negative for heartburn, abdominal pain, vomiting, diarrhea or consitpation Genitourinary: Negative for dysuria, urgency, frequency, hematuria Musculoskeletal: Negative for back pain or joint pain Neurological: Negative for dizziness, seizures, syncope, focal weakness,  numbness and headaches.  Hematological: Does not bruise/bleed easily.  Psychiatric/Behavioral: Dysphoric mood    Tolerating Diet: yes      DRUG ALLERGIES:  No Known Allergies  VITALS:  Blood pressure 138/64, pulse 93, temperature 98.6 F (37 C), temperature source Oral, resp. rate 18, height 5\' 10"  (1.778 m), weight 62.4 kg (137 lb 8 oz), SpO2 95 %.  PHYSICAL EXAMINATION:  Constitutional: Appears thin frail and cachectic  HENT: Normocephalic. Marland Kitchen Oropharynx is clear and moist.  Eyes: Conjunctivae and EOM are normal. PERRLA, no scleral icterus.  Neck: Normal ROM. Neck supple. No JVD. No tracheal deviation. CVS: RRR, S1/S2 +, no murmurs, no gallops, no carotid bruit.  Pulmonary: Effort and breath sounds normal, no stridor, rhonchi, wheezes, rales.  Chest tube placed Abdominal: Soft. BS +,  no distension, tenderness, rebound or guarding.  Musculoskeletal: Normal range of motion. 1+ arm and leg swelling and no tenderness.  Neuro: Alert. CN 2-12 grossly intact. No  focal deficits. Skin: Skin is warm and dry. No rash noted. Psychiatric: Dysphoric mood    LABORATORY PANEL:   CBC Recent Labs  Lab 08/23/17 2137  WBC 11.2*  HGB 8.4*  HCT 26.1*  PLT 360   ------------------------------------------------------------------------------------------------------------------  Chemistries  Recent Labs  Lab 08/23/17 2137  08/26/17 0735  NA 133*   < > 135  K 4.0   < > 3.6  CL 100*   < > 104  CO2 23   < > 26  GLUCOSE 192*   < > 85  BUN 17   < > 13  CREATININE 0.61   < > 0.43*  CALCIUM 8.1*   < > 7.9*  MG  --   --  1.5*  AST 32  --   --   ALT 15*  --   --   ALKPHOS 112  --   --   BILITOT 0.5  --   --    < > = values in this interval not displayed.   ------------------------------------------------------------------------------------------------------------------  Cardiac Enzymes Recent Labs  Lab 08/22/17 0429 08/22/17 1240 08/23/17 2137  TROPONINI <0.03 <0.03 <0.03   ------------------------------------------------------------------------------------------------------------------  RADIOLOGY:  Dg Chest Port 1 View  Result Date: 08/27/2017 CLINICAL DATA:  71 year old male with recurrent pneumothorax and chest tube in place. Esophageal cancer post chemotherapy. Subsequent encounter. EXAM: PORTABLE CHEST 1 VIEW COMPARISON:  08/26/2017 chest x-ray.  06/29/2017 CT. FINDINGS: Left-sided pigtail catheter chest tube in place. No definitive pneumothorax. Line projecting over the posterior left second rib felt to be related to the rib rather than pneumothorax. Blunting costophrenic angles suggestive of small pleural effusion/scarring. Right central line tip mid superior vena cava level. Heart size within normal limits.  Calcified aorta. CT detected pulmonary nodules not well delineated on present plain film exam. IMPRESSION: Left-sided pigtail catheter chest tube in place. No definitive pneumothorax. Line projecting over the posterior left second rib  felt to be related to the rib rather than pneumothorax. Blunting costophrenic angles suggestive of small pleural effusion/scarring. CT detected pulmonary nodules not well delineated on present plain film exam. Aortic Atherosclerosis (ICD10-I70.0). Electronically Signed   By: Genia Del M.D.   On: 08/27/2017 08:06   Dg Chest Port 1 View  Result Date: 08/26/2017 CLINICAL DATA:  Pneumothorax. EXAM: PORTABLE CHEST 1 VIEW COMPARISON:  Radiograph of August 25, 2017. FINDINGS: The heart size and mediastinal contours are within normal limits. Atherosclerosis of thoracic aorta is noted. Right internal jugular Port-A-Cath is unchanged in position. Left-sided chest tube is unchanged in position without definite pneumothorax. Stable left basilar atelectasis is noted with mild associated pleural effusion. Stable mild subcutaneous emphysema is seen over left lateral chest wall. The visualized skeletal structures are unremarkable. IMPRESSION: Stable position of left-sided chest tube without definite pneumothorax. Aortic atherosclerosis. Stable left basilar atelectasis is noted with associated pleural effusion. Electronically Signed   By: Marijo Conception, M.D.   On: 08/26/2017 07:36   Dg Chest Port 1 View  Result Date: 08/25/2017 CLINICAL DATA:  Chest tube placement. EXAM: PORTABLE CHEST 1 VIEW COMPARISON:  08/25/2017 at 1323 hours FINDINGS: A right jugular Port-A-Cath terminates over the lower SVC. A new left pigtail pleural catheter terminates over the left lung apex. The cardiomediastinal silhouette is unchanged. Aortic atherosclerosis is noted. The lungs are hyperinflated. External material creates artifact over the left lung apex. Within this limitation, no definite residual pneumothorax is identified. There are persistent small bilateral pleural effusions with minimal atelectasis in the left lung base. IMPRESSION: No definite residual pneumothorax identified following left chest tube placement. Electronically  Signed   By: Logan Bores M.D.   On: 08/25/2017 14:12   Dg Chest Port 1 View  Result Date: 08/25/2017 CLINICAL DATA:  Chest tube removed. Now with extreme shortness of breath. EXAM: PORTABLE CHEST 1 VIEW COMPARISON:  08/25/2017 FINDINGS: Patient developed a large left pneumothorax measuring roughly 50% since removal of the a left chest tube. No evidence for mediastinal shift. Right lung is clear with mild blunting at the right costophrenic angle. There is a right jugular Port-A-Cath with the tip in the SVC. Heart size is stable. IMPRESSION: New large left pneumothorax following removal of the chest tube. These results were called by telephone at the time of interpretation on 08/25/2017 at 1:41 pm to the patient's nurse, Meghan, who verbally acknowledged these results and acknowledged that Dr. Dahlia Byes was already aware of the findings. Electronically Signed   By: Markus Daft M.D.   On: 08/25/2017 13:43     ASSESSMENT AND PLAN:   71 year old male who was discharged yesterday after pelvic fracture and discharged to skilled nursing facility who presents with shortness of breath and found to have pneumothorax.   1. Pneumothorax: Chest tube was discontinued however shortly after patient had another episode of shortness of breath and found to have recurrence of the pneumothorax. Chest tube management as per surgery   2. Hyponatremia due to dehydration Sodium level has improved.  3. Recent pelvic fracture: Continue pain management  Patient will go home with hospice. He does not want to go to skilled nursing facility  4. Protein calorie malnutrition: Continue protein supplements.  5. stage IV gastroesophageal junction carcinoma: Continue pain management  6. Acute grief  reaction: Patient is emotional due to his wife passing away recently.  7. Shortness of breath: Patient is starting to cannulate fluids I will discontinue IV fluids. I will give 1 dose of IV Lasix Echocardiogram from the 21st shows  normal ejection fraction with moderate mitral regurgitation. I will also start IV steroids.   Management plans discussed with the patient and he is in agreement.  CODE STATUS: dnr  TOTAL TIME TAKING CARE OF THIS PATIENT: 25 minutes.     POSSIBLE D/C 1-2 days with hospice at home DEPENDING ON pneumothorax/SOB  Eythan Jayne M.D on 08/27/2017 at 11:22 AM  Between 7am to 6pm - Pager - [phone] After 6pm go to [email or web address] - password [email or web address] [facility]  Office  [phone]  CC: Primary care physician; Derinda Late, MD  Note: This dictation was prepared with Dragon dictation along with smaller phrase technology. Any transcriptional errors that result from this process are unintentional.

## 2017-08-27 NOTE — Progress Notes (Signed)
SURGICAL PROGRESS NOTE (cpt [phone])  Hospital Day(s): 3.   Post op day(s):  Marland Kitchen   Interval History: Patient, previously at inpatient rehab on hospice for metastatic esophageal cancer for which he's no longer attempting treatment beyond comfort was seen and examined with his family at bedside, reports no acute events or new complaints overnight except mild SOB that he describes as different than that for which he presented and required placement of Left-side chest tube. Patient reports minimal "tolerable" pain surrounding his Left-side pigtail chest tube and denies any fever/chills, N/V, or CP otherwise.  Review of Systems:  Constitutional: denies fever, chills  HEENT: denies cough or congestion  Respiratory: shortness of breath as per interval history  Cardiovascular: denies chest pain or palpitations  Gastrointestinal: denies abdominal pain, N/V, or diarrhea Genitourinary: denies burning with urination or urinary frequency Musculoskeletal: denies pain, decreased motor or sensation Integumentary: denies any other rashes or skin discolorations Neurological: denies HA or vision/hearing changes   Vital signs in last 24 hours: [min-max] current  Temp:  [97.7 F (36.5 C)-98.6 F (37 C)] 98.6 F (37 C) (11/26 0810) Pulse Rate:  [91-104] 93 (11/26 0810) Resp:  [18] 18 (11/26 0442) BP: (120-140)/(54-74) 138/64 (11/26 0810) SpO2:  [95 %-100 %] 95 % (11/26 0810)     Height: 5\' 10"  (177.8 cm) Weight: 137 lb 8 oz (62.4 kg) BMI (Calculated): 19.73   Intake/Output this shift:  Total I/O In: 360 [P.O.:360] Out: -    Intake/Output last 2 shifts:  @IOLAST2SHIFTS @   Physical Exam:  General: cachectic Constitutional: alert, cooperative and no distress  HENT: normocephalic without obvious abnormality  Eyes: PERRL, EOM's grossly intact and symmetric  Neuro: CN II - XII grossly intact and symmetric without deficit  Respiratory: breathing non-labored at rest  Cardiovascular: regular rate and  sinus rhythm  Gastrointestinal: soft, non-tender, and non-distended Musculoskeletal: UE and LE FROM, no edema or wounds, motor and sensation grossly intact, NT   Labs:  CBC Latest Ref Rng & Units 08/23/2017 08/21/2017 08/15/2017  WBC 3.8 - 10.6 K/uL 11.2(H) 9.0 7.7  Hemoglobin 13.0 - 18.0 g/dL 8.4(L) 9.3(L) 9.3(L)  Hematocrit 40.0 - 52.0 % 26.1(L) 28.4(L) 28.4(L)  Platelets 150 - 440 K/uL 360 262 219   CMP Latest Ref Rng & Units 08/26/2017 08/25/2017 08/23/2017  Glucose 65 - 99 mg/dL 85 92 192(H)  BUN 6 - 20 mg/dL 13 14 17   Creatinine 0.61 - 1.24 mg/dL 0.43(L) 0.43(L) 0.61  Sodium 135 - 145 mmol/L 135 135 133(L)  Potassium 3.5 - 5.1 mmol/L 3.6 3.6 4.0  Chloride 101 - 111 mmol/L 104 105 100(L)  CO2 22 - 32 mmol/L 26 26 23   Calcium 8.9 - 10.3 mg/dL 7.9(L) 7.9(L) 8.1(L)  Total Protein 6.5 - 8.1 g/dL - - 4.6(L)  Total Bilirubin 0.3 - 1.2 mg/dL - - 0.5  Alkaline Phos 38 - 126 U/L - - 112  AST 15 - 41 U/L - - 32  ALT 17 - 63 U/L - - 15(L)   Imaging studies:  Chest X-ray (08/27/2017) - personally reviewed and compared to previous studies Left-sided pigtail catheter chest tube in place. No definitive pneumothorax. Line projecting over the posterior left second rib felt to be related to the rib rather than pneumothorax. Blunting  costophrenic angles suggestive of small effusion/scarring.  CT detected pulmonary nodules not well delineated on  present plain film exam. Aortic atherosclerosis.  Assessment/Plan: (ICD-10's: J93.83) 71 y.o. male with recurrent Left-side pneumothorax for which pigtail thoracostomy chest tube was replaced,  but this time with tube tunneled across at least one intercostal space, and complicated by pertinent comorbidities including metastatic esophageal cancer for which patient has chosen home hospice as well as by severe malnutrition (BMI: 19), COPD, and chronic anemia.   - pain control prn  - will plan for chest tube to suction x 48 hours   - if lung  re-expanded at 48 hours, will place to water seal x 24 hours   - if lung remains re-expanded without suction, will plan to remove chest tube  - medical management and discharge planning as per primary medical team  - chest x-rays ordered for tomorrow and Wednesday mornings  All of the above findings and recommendations were discussed with the patient and patient's family, and all of patient's and his family's questions were answered to their expressed satisfaction.  Thank you for the opportunity to participate in this patient's care.  -- Marilynne Drivers Rosana Hoes, MD, [facility]: [facility] - Partnering for exceptional care. Office: [phone]

## 2017-08-27 NOTE — Plan of Care (Signed)
Discussed POC. Pt issue to address pain management. Discussed preference in pain management. Continuing to actively get pain level under control.

## 2017-08-27 NOTE — Progress Notes (Signed)
MEDICATION RELATED CONSULT NOTE - INITIAL   Pharmacy Consult for Electrolyte management Indication: Hypomagnesemia   No Known Allergies  Labs: Recent Labs    08/25/17 0544 08/26/17 0735  CREATININE 0.43* 0.43*  MG  --  1.5*   This SmartLink has not been configured with any valid records.   Lab Results  Component Value Date   K 3.6 08/26/2017   Estimated Creatinine Clearance: 76.9 mL/min (A) (by C-G formula based on SCr of 0.43 mg/dL (L)).  Medical History: Past Medical History:  Diagnosis Date  . Anemia   . Cancer ([place])   . COPD (chronic obstructive pulmonary disease) ([facility])     Assessment: 71 yo male with pneumothorax and chest tube. Patient [facility] from [facility] 11/22 s/p pelvic fx and d/c to SNF but returned a few hours later  Goal of Therapy:  Electrolytes WNL  Plan:  K 3.6  Mag 1.5 (11/25). Will order Magnesium sulfate 2 gram IV x1. F/u with am labs  Erica Richwine A 08/27/2017,1:53 PM

## 2017-08-28 ENCOUNTER — Telehealth: Payer: Self-pay | Admitting: *Deleted

## 2017-08-28 ENCOUNTER — Inpatient Hospital Stay: Payer: PPO

## 2017-08-28 LAB — BASIC METABOLIC PANEL
ANION GAP: 6 (ref 5–15)
BUN: 16 mg/dL (ref 6–20)
CALCIUM: 8.1 mg/dL — AB (ref 8.9–10.3)
CO2: 24 mmol/L (ref 22–32)
Chloride: 104 mmol/L (ref 101–111)
Creatinine, Ser: 0.55 mg/dL — ABNORMAL LOW (ref 0.61–1.24)
GLUCOSE: 101 mg/dL — AB (ref 65–99)
Potassium: 3.9 mmol/L (ref 3.5–5.1)
SODIUM: 134 mmol/L — AB (ref 135–145)

## 2017-08-28 LAB — CULTURE, BLOOD (ROUTINE X 2)
CULTURE: NO GROWTH
Culture: NO GROWTH
SPECIAL REQUESTS: ADEQUATE
Special Requests: ADEQUATE

## 2017-08-28 LAB — MAGNESIUM: Magnesium: 1.9 mg/dL (ref 1.7–2.4)

## 2017-08-28 LAB — ALBUMIN: ALBUMIN: 1.8 g/dL — AB (ref 3.5–5.0)

## 2017-08-28 MED ORDER — MORPHINE SULFATE (CONCENTRATE) 10 MG/0.5ML PO SOLN
5.0000 mg | ORAL | Status: DC | PRN
  Administered 2017-08-28 (×2): 5 mg via ORAL
  Filled 2017-08-28 (×2): qty 1

## 2017-08-28 MED ORDER — MORPHINE SULFATE (CONCENTRATE) 10 MG/0.5ML PO SOLN
20.0000 mg | ORAL | Status: DC | PRN
  Administered 2017-08-28 – 2017-08-30 (×11): 20 mg via ORAL
  Filled 2017-08-28 (×11): qty 1

## 2017-08-28 MED ORDER — METHYLPREDNISOLONE SODIUM SUCC 40 MG IJ SOLR
40.0000 mg | INTRAMUSCULAR | Status: DC
  Administered 2017-08-28: 40 mg via INTRAVENOUS
  Filled 2017-08-28: qty 1

## 2017-08-28 MED ORDER — FUROSEMIDE 10 MG/ML IJ SOLN
20.0000 mg | Freq: Once | INTRAMUSCULAR | Status: AC
  Administered 2017-08-28: 20 mg via INTRAVENOUS
  Filled 2017-08-28: qty 2

## 2017-08-28 NOTE — Progress Notes (Signed)
Pt. discussed rencent loss of daughter and loss of wife with in this pass month. Listened to patient and offered chaplin services. Pt. Stated he was aware of access to [place]. Declined to speak with Chaplin.

## 2017-08-28 NOTE — Consult Note (Signed)
   [facility] Inpatient Consult   08/28/2017  CHRISTOPHE RISING [birth year] [id number]    Patient screened for potential [facility] Care Management services.  Chart reviewed. Noted discharge plan is home with hospice.   No identifiable [facility] Care Management needs at this time.  Marthenia Rolling, MSN-Ed, RN,BSN [facility] Liaison [phone]

## 2017-08-28 NOTE — Progress Notes (Signed)
Nutrition Follow-up  DOCUMENTATION CODES:   Severe malnutrition in context of chronic illness  INTERVENTION:  Will discontinue Magic Cup and Boost Breeze per patient request.  Encouraged adequate intake of meals patient enjoys.  NUTRITION DIAGNOSIS:   Severe Malnutrition related to cancer and cancer related treatments as evidenced by severe fat depletion, severe muscle depletion.  Ongoing.  GOAL:   Patient will meet greater than or equal to 90% of their needs  Progressing.  MONITOR:   PO intake, Labs, Weight trends, Skin, I & O's  REASON FOR ASSESSMENT:   Other (Comment)(low BMI)    ASSESSMENT:   71 y.o. male with past medical history including stage IV metyastatic esophageal cancer and recent admission for syncope and nonoperative pelvic fractures, who presents with respiratory distress, acute onset when he was in the rehab facility, occurring after he was administered Norvasc, and associated with increased generalized weakness. Chest x-ray revealed a large spontaneous left sided pneumothorax, with no evidence of tension.  Due to patient's acutely abnormal vital signs, a chest tube was placed emergently   Met with patient and family members at bedside. Patient reports his appetite has improved. He had 100% of his breakfast this morning and yesterday. He is eating all 3 meals daily plus snacks between meals. He does not like the Magic Cup or Boost Breeze and wants them discontinued. He reports there is no point. He reports he will be discharging home with hospice. Denies any N/V or abdominal pain. Noted patient now has moderate-severe pitting edema. May be related to patient's severe malnutrition. However, he no longer wants any nutrition interventions.  Medications reviewed and include: Colace, Solu-Medrol 40 mg daily, MVI daily, pantoprazole.  Labs reviewed: Sodium 134, Creatinine 0.55.  Diet Order:  Diet regular Room service appropriate? Yes; Fluid consistency:  Thin  EDUCATION NEEDS:   Not appropriate for education at this time  Skin:  Skin Assessment: Reviewed RN Assessment(weeping to bilateral arms)  Last BM:  08/28/2017 - large type 3  Height:   Ht Readings from Last 1 Encounters:  08/24/17 5' 10" (1.778 m)    Weight:   Wt Readings from Last 1 Encounters:  08/28/17 142 lb 6.4 oz (64.6 kg)    Ideal Body Weight:  75.5 kg  BMI:  Body mass index is 20.43 kg/m.  Estimated Nutritional Needs:   Kcal:  1700-2000kcal/day   Protein:  80-95g/day   Fluid:  >1.7L/day    Stephens, MS, RD, LDN Office: [phone] Pager: [phone] After Hours/Weekend Pager: [phone]  

## 2017-08-28 NOTE — Progress Notes (Signed)
MEDICATION RELATED CONSULT NOTE - INITIAL   Pharmacy Consult for Electrolyte management Indication: Hypomagnesemia   No Known Allergies  Labs: Recent Labs    08/26/17 0735 08/28/17 0349  CREATININE 0.43* 0.55*  MG 1.5* 1.9   This SmartLink has not been configured with any valid records.   Lab Results  Component Value Date   K 3.9 08/28/2017   Estimated Creatinine Clearance: 79.6 mL/min (A) (by C-G formula based on SCr of 0.55 mg/dL (L)).  Medical History: Past Medical History:  Diagnosis Date  . Anemia   . Cancer ([place])   . COPD (chronic obstructive pulmonary disease) (HCC)     Assessment: 71 yo male with pneumothorax and chest tube. Patient discontinued from [facility] 11/22 s/p pelvic fx and d/c to SNF but returned a few hours later  Goal of Therapy:  Electrolytes WNL  Plan:  K 3.9  Mag 1.9  No replacement indicated at this time.  F/u with am labs  Pernell Dupre, PharmD, BCPS Clinical Pharmacist 08/28/2017 7:04 AM

## 2017-08-28 NOTE — Progress Notes (Signed)
[place] [facility] [place] NAME: Larry Wood    MR#:  [id number]  DATE OF BIRTH:  Aug 02, 1946  SUBJECTIVE:   Patient feeling better this  denies shortness of breath.  Son is at bedside  REVIEW OF SYSTEMS:    Review of Systems  Constitutional: Negative for fever, chills weight loss Positive generalized weakness HENT: Negative for ear pain, nosebleeds, congestion, facial swelling, rhinorrhea, neck pain, neck stiffness and ear discharge.   Respiratory:  Negative shortness of breath. Cardiovascular: Negative for chest pain, palpitations  positive for arm and leg swelling Gastrointestinal: Negative for heartburn, abdominal pain, vomiting, diarrhea or consitpation Genitourinary: Negative for dysuria, urgency, frequency, hematuria Musculoskeletal: Negative for back pain or joint pain Neurological: Negative for dizziness, seizures, syncope, focal weakness,  numbness and headaches.  Hematological: Does not bruise/bleed easily.  Psychiatric/Behavioral: Dysphoric mood    Tolerating Diet: yes      DRUG ALLERGIES:  No Known Allergies  VITALS:  Blood pressure 139/71, pulse 87, temperature (!) 97.5 F (36.4 C), temperature source Oral, resp. rate 20, height 5\' 10"  (1.778 m), weight 64.6 kg (142 lb 6.4 oz), SpO2 98 %.  PHYSICAL EXAMINATION:  Constitutional: Appears thin frail and cachectic  HENT: Normocephalic. Marland Kitchen Oropharynx is clear and moist.  Eyes: Conjunctivae and EOM are normal. PERRLA, no scleral icterus.  Neck: Normal ROM. Neck supple. No JVD. No tracheal deviation. CVS: RRR, S1/S2 +, no murmurs, no gallops, no carotid bruit.  Pulmonary: Effort and breath sounds normal, no stridor, rhonchi, wheezes, rales.  Chest tube placed Abdominal: Soft. BS +,  no distension, tenderness, rebound or guarding.  Musculoskeletal: Normal range of motion. 1+ arm and leg swelling and no tenderness.  Neuro: Alert. CN 2-12 grossly intact. No focal  deficits. Skin: Skin is warm and dry. No rash noted. Psychiatric: Dysphoric mood    LABORATORY PANEL:   CBC Recent Labs  Lab 08/23/17 2137  WBC 11.2*  HGB 8.4*  HCT 26.1*  PLT 360   ------------------------------------------------------------------------------------------------------------------  Chemistries  Recent Labs  Lab 08/23/17 2137  08/28/17 0349  NA 133*   < > 134*  K 4.0   < > 3.9  CL 100*   < > 104  CO2 23   < > 24  GLUCOSE 192*   < > 101*  BUN 17   < > 16  CREATININE 0.61   < > 0.55*  CALCIUM 8.1*   < > 8.1*  MG  --    < > 1.9  AST 32  --   --   ALT 15*  --   --   ALKPHOS 112  --   --   BILITOT 0.5  --   --    < > = values in this interval not displayed.   ------------------------------------------------------------------------------------------------------------------  Cardiac Enzymes Recent Labs  Lab 08/22/17 0429 08/22/17 1240 08/23/17 2137  TROPONINI <0.03 <0.03 <0.03   ------------------------------------------------------------------------------------------------------------------  RADIOLOGY:  Dg Chest Port 1 View  Result Date: 08/28/2017 CLINICAL DATA:  Pneumothorax EXAM: PORTABLE CHEST 1 VIEW COMPARISON:  08/27/2017 FINDINGS: Pigtail pleural catheter overlies the left lung apex unchanged in position. Lack of lung markings in the left lateral apex suspicious for pneumothorax although no definite pleural line identified. COPD with hyperinflation. Mild left lower lobe atelectasis unchanged. Small bilateral pleural effusions unchanged. Port-A-Cath tip SVC unchanged IMPRESSION: Left pleural catheter remains in place. Suspicion for left pneumothorax although no definite pleural line identified. Electronically Signed   By: Franchot Gallo  M.D.   On: 08/28/2017 06:55   Dg Chest Port 1 View  Result Date: 08/27/2017 CLINICAL DATA:  71 year old male with recurrent pneumothorax and chest tube in place. Esophageal cancer post chemotherapy. Subsequent  encounter. EXAM: PORTABLE CHEST 1 VIEW COMPARISON:  08/26/2017 chest x-ray.  06/29/2017 CT. FINDINGS: Left-sided pigtail catheter chest tube in place. No definitive pneumothorax. Line projecting over the posterior left second rib felt to be related to the rib rather than pneumothorax. Blunting costophrenic angles suggestive of small pleural effusion/scarring. Right central line tip mid superior vena cava level. Heart size within normal limits. Calcified aorta. CT detected pulmonary nodules not well delineated on present plain film exam. IMPRESSION: Left-sided pigtail catheter chest tube in place. No definitive pneumothorax. Line projecting over the posterior left second rib felt to be related to the rib rather than pneumothorax. Blunting costophrenic angles suggestive of small pleural effusion/scarring. CT detected pulmonary nodules not well delineated on present plain film exam. Aortic Atherosclerosis (ICD10-I70.0). Electronically Signed   By: Genia Del M.D.   On: 08/27/2017 08:06     ASSESSMENT AND PLAN:   71 year old male who was discharged yesterday after pelvic fracture and discharged to skilled nursing facility who presents with shortness of breath and found to have pneumothorax.   1. Pneumothorax: Chest tube was discontinued however shortly after patient had another episode of shortness of breath and found to have recurrence of the pneumothorax. Chest tube management as per surgery Repeat CXR in am hopefully can remove tomorrow   2. Hyponatremia due to dehydration Sodium level has improved.  3. Recent pelvic fracture: Continue pain management  Patient will go home with hospice. He does not want to go to skilled nursing facility  4. Protein calorie malnutrition: Continue protein supplements. Check albumin level  5. stage IV gastroesophageal junction carcinoma: Continue pain management  6. Acute grief reaction: Patient is emotional due to his wife passing away recently.  7.  Shortness of breath and edema due to fluid overload (acute on chronic diastolic heart failure) as well as low albumin level and COPD exacerbation Echocardiogram from the 21st shows normal ejection fraction with moderate mitral regurgitation. LASIX 20 mg x1 Wean steroids BMP in am   Management plans discussed with the patient and son and he is in agreement.  CODE STATUS: dnr  TOTAL TIME TAKING CARE OF THIS PATIENT: 25 minutes.     POSSIBLE D/C 1-2 days with hospice at home DEPENDING ON pneumothorax/SOB  Karmina Zufall M.D on 08/28/2017 at 11:33 AM  Between 7am to 6pm - Pager - [phone] After 6pm go to [email or web address] - password [email or web address] [facility]  Office  [phone]  CC: Primary care physician; Derinda Late, MD  Note: This dictation was prepared with Dragon dictation along with smaller phrase technology. Any transcriptional errors that result from this process are unintentional.

## 2017-08-28 NOTE — Progress Notes (Signed)
New referral for [facility] services at home received over the weekend form [place]. 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) for treatment of a pneumothorax.  He has required placement of a chest tube, which is currently to water seal. Patient has chosen not to return to [facility] and wishes to return home with the support of hospice services. He does not want any further treatment for his cancer. Writer met in the room with Larry Wood, his granddaughter Larry Wood , son in law Larry Wood, and 2  other family members to initiate education regarding hospice services, philosophy and team  approach to care with good understanding voiced. DME needs discussed, patient requests a hospital bed, over bed table, BSC and rolling walker. Larry Wood to be the contact for delivery  [phone]), family requests delivery this afternoon.  Patient continues with significant pain, he currently is taking MS Contin 30 mg BID with 5 mg of liquid morphine q 4 hrs PRN and Norco  5/325 mg alternating, nursing staff is giving him Pain medication q 2 hrs. Writer discussed with patient, family and staff RN Jinny Blossom and this was addressed with attending physician Dr. Benjie Karvonen. Liquid morphine increased to 20 mg q 4 hrs PRN and Norco will be discontinued. Patient information faxed to referral. Possible discharge home tomorrow. Patient will require EMS for transport home with a signed DNR in place. Hospice information and contact numbers given to Larry Wood. Will continue to follow through final disposition.  Flo Shanks RN, BSN, [facility] [place], hospital liaison [phone]

## 2017-08-28 NOTE — Telephone Encounter (Signed)
Yes I am willing to he his hospice physician and sign the orders.   Thanks, Astrid Divine

## 2017-08-28 NOTE — Progress Notes (Signed)
Physical Therapy Treatment Patient Details Name: Larry Wood MRN: [id number] DOB: [birth year] Today's Date: 08/28/2017    History of Present Illness Pt is a 71 y/o M who presented to ER on 11/20 secondary to unwitnessed fall in home environment with acute onset of back, pelvic and bilat hip pain. Per CT of pelvis, noted with R superior/inferior pubic rami fractures, L sacral ala fracture and L anterior acetabular wall fracture. Per notes, patient to be WBAT bilat LEs.  Pt was d/c to SNF and was only there for 3 hrs before returning to the hospital as pt was presenting with SOB.  The pt was placed on a nonrebreather and chest x-ray demonstrates pneumothorax.  Pt had chest tube placed with rapid improvement in work of breathing.  Pt's PMH includes cancer, COPD, stage IV gastroesophageal junction carcinoma.  Pt still grieving as his wife passed away just a few days PTA.      PT Comments    Pt agreeable to PT; denies pain currently, but does note he has pain at times in R hip. Pt participates in long sit and seated exercises well and reports he has been doing exercises on his own in the bed as well. Pt does not wish to walk at this time due to fatigue. Pt eager to get better. Continue PT to progress strength and endurance and improve functional mobility.    Follow Up Recommendations        Equipment Recommendations       Recommendations for Other Services       Precautions / Restrictions Precautions Precautions: Fall;Other (comment) Restrictions Weight Bearing Restrictions: No    Mobility  Bed Mobility               General bed mobility comments: Not tested; up in chair  Transfers                    Ambulation/Gait                 Stairs            Wheelchair Mobility    Modified Rankin (Stroke Patients Only)       Balance                                            Cognition Arousal/Alertness: Awake/alert Behavior  During Therapy: WFL for tasks assessed/performed Overall Cognitive Status: Within Functional Limits for tasks assessed                                        Exercises General Exercises - Lower Extremity Ankle Circles/Pumps: AROM;Both;20 reps Quad Sets: Strengthening;Both;20 reps Gluteal Sets: Strengthening;Both;20 reps Long Arc Quad: AROM;AAROM;Both;10 reps;Seated(2 sets) Heel Slides: AROM;AAROM;Both;20 reps Hip ABduction/ADduction: AAROM;Both;20 reps Hip Flexion/Marching: AROM;Both;20 reps;Seated    General Comments        Pertinent Vitals/Pain Pain Assessment: No/denies pain    Home Living                      Prior Function            PT Goals (current goals can now be found in the care plan section) Progress towards PT goals: Progressing toward goals    Frequency    Min 2X/week  PT Plan Current plan remains appropriate    Co-evaluation              AM-PAC PT "6 Clicks" Daily Activity  Outcome Measure  Difficulty turning over in bed (including adjusting bedclothes, sheets and blankets)?: Unable Difficulty moving from lying on back to sitting on the side of the bed? : Unable Difficulty sitting down on and standing up from a chair with arms (e.g., wheelchair, bedside commode, etc,.)?: Unable Help needed moving to and from a bed to chair (including a wheelchair)?: A Lot Help needed walking in hospital room?: A Lot Help needed climbing 3-5 steps with a railing? : A Lot 6 Click Score: 9    End of Session   Activity Tolerance: Patient tolerated treatment well;Patient limited by fatigue Patient left: in chair;with call bell/phone within reach;with chair alarm set;with family/visitor present   PT Visit Diagnosis: Pain;Unsteadiness on feet (R26.81);Other abnormalities of gait and mobility (R26.89);Muscle weakness (generalized) (M62.81) Pain - Right/Left: Left Pain - part of body: Hip     Time: 1552-0802 PT Time  Calculation (min) (ACUTE ONLY): 27 min  Charges:  $Therapeutic Exercise: 23-37 mins                    G CodesLarae Grooms, PTA 08/28/2017, 4:50 PM

## 2017-08-28 NOTE — Progress Notes (Signed)
SURGICAL PROGRESS NOTE (cpt [phone])  Hospital Day(s): 4.   Post op day(s):  Marland Kitchen   Interval History: Patient seen and examined, no acute events or new complaints overnight. Patient reports his mild SOB seems to be improving with decreased and now stopped IVF. He otherwise reports only minimal pain around the Left chest tube insertion site and denies CP, fever/chills, N/V, or any new complaints otherwise.  Review of Systems:  Constitutional: denies fever, chills  HEENT: denies cough or congestion  Respiratory: shortness of breath as per interval history  Cardiovascular: denies chest pain or palpitations  Gastrointestinal: denies abdominal pain, N/V, or diarrhea Genitourinary: denies burning with urination or urinary frequency Musculoskeletal: denies pain, decreased motor or sensation Integumentary: denies any other rashes or skin discolorations Neurological: denies HA or vision/hearing changes   Vital signs in last 24 hours: [min-max] current  Temp:  [97.5 F (36.4 C)-98 F (36.7 C)] 97.5 F (36.4 C) (11/27 0500) Pulse Rate:  [87-99] 87 (11/27 0500) Resp:  [20] 20 (11/26 2002) BP: (102-139)/(51-71) 139/71 (11/27 0500) SpO2:  [98 %-99 %] 98 % (11/27 0500) Weight:  [142 lb 6.4 oz (64.6 kg)] 142 lb 6.4 oz (64.6 kg) (11/27 0500)     Height: 5\' 10"  (177.8 cm) Weight: 142 lb 6.4 oz (64.6 kg) BMI (Calculated): 20.43   Intake/Output this shift:  No intake/output data recorded.   Intake/Output last 2 shifts:  @IOLAST2SHIFTS @   Physical Exam:  Constitutional: alert, cooperative and no distress  HENT: normocephalic without obvious abnormality  Eyes: PERRL, EOM's grossly intact and symmetric  Neuro: CN II - XII grossly intact and symmetric without deficit  Respiratory: breathing non-labored at rest  Cardiovascular: regular rate and sinus rhythm  Gastrointestinal: soft, non-tender, and non-distended Musculoskeletal: UE and LE FROM, improved mild-/moderate- LUE edema, motor and sensation  grossly intact, NT   Labs:  CBC Latest Ref Rng & Units 08/23/2017 08/21/2017 08/15/2017  WBC 3.8 - 10.6 K/uL 11.2(H) 9.0 7.7  Hemoglobin 13.0 - 18.0 g/dL 8.4(L) 9.3(L) 9.3(L)  Hematocrit 40.0 - 52.0 % 26.1(L) 28.4(L) 28.4(L)  Platelets 150 - 440 K/uL 360 262 219   CMP Latest Ref Rng & Units 08/28/2017 08/26/2017 08/25/2017  Glucose 65 - 99 mg/dL 101(H) 85 92  BUN 6 - 20 mg/dL 16 13 14   Creatinine 0.61 - 1.24 mg/dL 0.55(L) 0.43(L) 0.43(L)  Sodium 135 - 145 mmol/L 134(L) 135 135  Potassium 3.5 - 5.1 mmol/L 3.9 3.6 3.6  Chloride 101 - 111 mmol/L 104 104 105  CO2 22 - 32 mmol/L 24 26 26   Calcium 8.9 - 10.3 mg/dL 8.1(L) 7.9(L) 7.9(L)  Total Protein 6.5 - 8.1 g/dL - - -  Total Bilirubin 0.3 - 1.2 mg/dL - - -  Alkaline Phos 38 - 126 U/L - - -  AST 15 - 41 U/L - - -  ALT 17 - 63 U/L - - -   Imaging studies:  Chest X-ray (08/28/2017) - personally reviewed, compared with prior studies, and discussed with patient: no PTX appreciated Pigtail pleural catheter overlies the left lung apex unchanged in position. Lack of lung markings in the left lateral apex suspicious for pneumothorax although no definite pleural line identified.  COPD with hyperinflation. Mild left lower lobe atelectasis unchanged. Small bilateral pleural effusions unchanged. Port-A-Cath tip SVC unchanged  Assessment/Plan: (ICD-10's: J93.83) 71 y.o. male with recurrent Left-side pneumothorax for which pigtail thoracostomy chest tube was replaced, but this time with tube tunneled across at least one intercostal space, and complicated by pertinent  comorbidities including metastatic esophageal cancer for which patient has chosen home hospice as well as by severe malnutrition (BMI: 19), COPD, and chronic anemia.              - pain control prn             - chest tube placed to water seal, will continue x 24 hours             - if lung remains re-expanded without suction, will plan to remove chest tube             - medical  management and discharge planning as per primary medical team             - chest x-ray ordered for tomorrow morning    All of the above findings and recommendations were discussed with the patient, and all of patient's questions were answered to his expressed satisfaction.  Thank you for the opportunity to participate in this patient's care.  -- Marilynne Drivers Rosana Hoes, MD, [place]: [facility] - Partnering for exceptional care. Office: [phone]

## 2017-08-28 NOTE — Telephone Encounter (Signed)
Hospice informed  

## 2017-08-28 NOTE — Telephone Encounter (Signed)
Referral received from [facility] for services and is asking if Dr Janese Banks will be attending. If so, please send orders

## 2017-08-29 ENCOUNTER — Inpatient Hospital Stay: Payer: PPO

## 2017-08-29 LAB — BASIC METABOLIC PANEL
Anion gap: 5 (ref 5–15)
BUN: 17 mg/dL (ref 6–20)
CHLORIDE: 101 mmol/L (ref 101–111)
CO2: 27 mmol/L (ref 22–32)
Calcium: 8.3 mg/dL — ABNORMAL LOW (ref 8.9–10.3)
Creatinine, Ser: 0.45 mg/dL — ABNORMAL LOW (ref 0.61–1.24)
GFR calc Af Amer: >60 mL/min (ref >60)
GFR calc non Af Amer: >60 mL/min (ref >60)
GLUCOSE: 89 mg/dL (ref 65–99)
POTASSIUM: 4 mmol/L (ref 3.5–5.1)
SODIUM: 133 mmol/L — AB (ref 135–145)

## 2017-08-29 LAB — CBC
HEMATOCRIT: 22.7 % — AB (ref 40.0–52.0)
Hemoglobin: 7.3 g/dL — ABNORMAL LOW (ref 13.0–18.0)
MCH: 28.3 pg (ref 26.0–34.0)
MCHC: 31.9 g/dL — ABNORMAL LOW (ref 32.0–36.0)
MCV: 88.7 fL (ref 80.0–100.0)
Platelets: 297 10*3/uL (ref 150–440)
RBC: 2.56 MIL/uL — ABNORMAL LOW (ref 4.40–5.90)
RDW: 20.3 % — AB (ref 11.5–14.5)
WBC: 9.5 10*3/uL (ref 3.8–10.6)

## 2017-08-29 MED ORDER — PREDNISONE 50 MG PO TABS
50.0000 mg | ORAL_TABLET | Freq: Every day | ORAL | Status: DC
  Administered 2017-08-30: 10:00:00 50 mg via ORAL
  Filled 2017-08-29: qty 1

## 2017-08-29 NOTE — Progress Notes (Signed)
PT Cancellation Note  Patient Details Name: Larry Wood MRN: [id number] DOB: [birth year]   Cancelled Treatment:    Reason Eval/Treat Not Completed: Fatigue/lethargy limiting ability to participate. Pt refuses attempted PT today due to pain, fatigue/lethargy. Pt notes having pain medication in the past several hours with little relief and notes nursing is working on a pain management plan.  Re attempt at a later time/date as the schedule allows.    Larae Grooms, PTA 08/29/2017, 1:09 PM

## 2017-08-29 NOTE — Progress Notes (Signed)
MEDICATION RELATED CONSULT NOTE - INITIAL   Pharmacy Consult for Electrolyte management Indication: Hypomagnesemia   No Known Allergies  Labs: Recent Labs    08/26/17 0735 08/28/17 0349 08/29/17 0504  CREATININE 0.43* 0.55* 0.45*  MG 1.5* 1.9  --   ALBUMIN  --  1.8*  --    This SmartLink has not been configured with any valid records.   Lab Results  Component Value Date   K 4.0 08/29/2017   Estimated Creatinine Clearance: 77.8 mL/min (A) (by C-G formula based on SCr of 0.45 mg/dL (L)).  Medical History: Past Medical History:  Diagnosis Date  . Anemia   . Cancer ([place])   . COPD (chronic obstructive pulmonary disease) (HCC)     Assessment: 71 yo male with pneumothorax and chest tube. Patient discontinued from [facility] 11/22 s/p pelvic fx and d/c to SNF but returned a few hours later  Goal of Therapy:  Electrolytes WNL  Plan:  No replacement indicated at this time.  Will F/U with Mg level in AM  Pernell Dupre, PharmD, BCPS Clinical Pharmacist 08/29/2017 7:14 AM

## 2017-08-29 NOTE — Progress Notes (Signed)
SURGICAL PROGRESS NOTE (cpt [id number])  Hospital Day(s): 5.   Post op day(s):  Marland Kitchen   Interval History: Patient seen and examined, no acute events or new complaints overnight. Patient reports he continues to have mild SOB that improved some after IVF was discontinued. He also describes mild pain around his Left chest tube, but denies worsened SOB since his chest tube was placed to waterseal (off suction) yesterday and denies CP, fever/chills, abdominal pain, or N/V.  Review of Systems:  Constitutional: denies fever, chills  HEENT: denies cough or congestion  Respiratory: shortness of breath as per interval history Cardiovascular: denies chest pain or palpitations  Gastrointestinal: denies abdominal pain, N/V, or diarrhea Genitourinary: denies burning with urination or urinary frequency Musculoskeletal: denies pain, decreased motor or sensation Integumentary: denies any other rashes or skin discolorations Neurological: denies HA or vision/hearing changes   Vital signs in last 24 hours: [min-max] current  Temp:  [97.3 F (36.3 C)-98.1 F (36.7 C)] 97.3 F (36.3 C) (11/28 0416) Pulse Rate:  [87-91] 91 (11/28 0416) Resp:  [16-18] 16 (11/28 0416) BP: (127-154)/(56-82) 141/67 (11/28 0416) SpO2:  [95 %-98 %] 95 % (11/28 0416) Weight:  [139 lb 1.6 oz (63.1 kg)] 139 lb 1.6 oz (63.1 kg) (11/28 0500)     Height: 5\' 10"  (177.8 cm) Weight: 139 lb 1.6 oz (63.1 kg) BMI (Calculated): 19.96   Intake/Output this shift:  No intake/output data recorded.   Intake/Output last 2 shifts:  @IOLAST2SHIFTS @   Physical Exam:  Constitutional: alert, cooperative and no distress  HENT: normocephalic without obvious abnormality  Eyes: PERRL, EOM's grossly intact and symmetric  Neuro: CN II - XII grossly intact and symmetric without deficit  Respiratory: breathing non-labored at rest, chest tube well-secured, no air leak Cardiovascular: regular rate and sinus rhythm  Gastrointestinal: soft, non-tender, and  non-distended Musculoskeletal: UE and LE FROM, motor and sensation grossly intact, NT   Labs:  CBC Latest Ref Rng & Units 08/23/2017 08/21/2017 08/15/2017  WBC 3.8 - 10.6 K/uL 11.2(H) 9.0 7.7  Hemoglobin 13.0 - 18.0 g/dL 8.4(L) 9.3(L) 9.3(L)  Hematocrit 40.0 - 52.0 % 26.1(L) 28.4(L) 28.4(L)  Platelets 150 - 440 K/uL 360 262 219   CMP Latest Ref Rng & Units 08/29/2017 08/28/2017 08/26/2017  Glucose 65 - 99 mg/dL 89 101(H) 85  BUN 6 - 20 mg/dL 17 16 13   Creatinine 0.61 - 1.24 mg/dL 0.45(L) 0.55(L) 0.43(L)  Sodium 135 - 145 mmol/L 133(L) 134(L) 135  Potassium 3.5 - 5.1 mmol/L 4.0 3.9 3.6  Chloride 101 - 111 mmol/L 101 104 104  CO2 22 - 32 mmol/L 27 24 26   Calcium 8.9 - 10.3 mg/dL 8.3(L) 8.1(L) 7.9(L)  Total Protein 6.5 - 8.1 g/dL - - -  Total Bilirubin 0.3 - 1.2 mg/dL - - -  Alkaline Phos 38 - 126 U/L - - -  AST 15 - 41 U/L - - -  ALT 17 - 63 U/L - - -   Imaging studies:  Chest X-ray (08/29/2017) - personally reviewed, compared to prior studies, and discussed with patient and his family Left chest tube remains in place. No pneumothorax. COPD with improvement in left lower lobe atelectasis.  Assessment/Plan:(ICD-10's: J93.83) 71 y.o.malewith recurrent Left-side pneumothorax for which pigtail thoracostomy chest tube was replaced, but this time with tube tunneled across at least one intercostal space, andcomplicated by pertinent comorbidities including metastatic esophageal cancer for which patient has chosen home hospice as well as by severe malnutrition (BMI: 19), COPD, and chronic anemia.  -  pain control prn - chest tube removed and air-tight dressing applied - considering primary service plans for monitoring overnight, CXR ordered for morning - medical management and discharge planning as per primary medical team - if no pneumothorax on CXR tomorrow morning, okay with discharge  All of the above findings and  recommendations were discussed with the patient and his family, and all of patient's and his family's questions were answered to their expressed satisfaction.  Thank you for the opportunity to participate in this patient's care.  -- Marilynne Drivers Rosana Hoes, MD, [place]: [facility] - Partnering for exceptional care. Office: [phone]

## 2017-08-29 NOTE — Progress Notes (Addendum)
Follow up visit to new referral for hospice services. Patient seen sitting up in bed, still with some complaints of pain. Chest tube removed this afternoon. He also reports depression related to his wife's sudden death last week. Active listening and emotional support provided. Writer also made Mr. Nater aware of bereavement support that Hospice will provide. Son in law Jenny Reichmann and his girl friend Tretha Sciara present during visit. The funeral is in [place] tomorrow and family will be attending. Tretha Sciara will  be staying with Mr. Ebeling at the hospital until his discharge. He will require EMS transport. Will continue to follow.  Flo Shanks RN, BSN, [facility] hospital liaison  [phone] c

## 2017-08-29 NOTE — Progress Notes (Signed)
[facility] [place] NAME: Larry Wood    MR#:  [id number]  DATE OF BIRTH:  Aug 05, 1946  SUBJECTIVE:   Denies SOB   REVIEW OF SYSTEMS:    Review of Systems  Constitutional: Negative for fever, chills weight loss Positive generalized weakness HENT: Negative for ear pain, nosebleeds, congestion, facial swelling, rhinorrhea, neck pain, neck stiffness and ear discharge.   Respiratory:  Negative shortness of breath. Cardiovascular: Negative for chest pain, palpitations  positive for arm and leg swelling Gastrointestinal: Negative for heartburn, abdominal pain, vomiting, diarrhea or consitpation Genitourinary: Negative for dysuria, urgency, frequency, hematuria Musculoskeletal: Negative for back pain or joint pain Neurological: Negative for dizziness, seizures, syncope, focal weakness,  numbness and headaches.  Hematological: Does not bruise/bleed easily.  Psychiatric/Behavioral: Dysphoric mood    Tolerating Diet: yes      DRUG ALLERGIES:  No Known Allergies  VITALS:  Blood pressure (!) 141/67, pulse 91, temperature (!) 97.3 F (36.3 C), temperature source Oral, resp. rate 16, height 5\' 10"  (1.778 m), weight 63.1 kg (139 lb 1.6 oz), SpO2 95 %.  PHYSICAL EXAMINATION:  Constitutional: Appears thin frail and cachectic  HENT: Normocephalic. Marland Kitchen Oropharynx is clear and moist.  Eyes: Conjunctivae and EOM are normal. PERRLA, no scleral icterus.  Neck: Normal ROM. Neck supple. No JVD. No tracheal deviation. CVS: RRR, S1/S2 +, no murmurs, no gallops, no carotid bruit.  Pulmonary: Effort and breath sounds normal, no stridor, rhonchi, wheezes, rales.  Chest tube placed Abdominal: Soft. BS +,  no distension, tenderness, rebound or guarding.  Musculoskeletal: Normal range of motion. 1+ arm and leg swelling and no tenderness.  Neuro: Alert. CN 2-12 grossly intact. No focal deficits. Skin: Skin is warm and dry. No rash noted. Psychiatric:  Dysphoric mood    LABORATORY PANEL:   CBC Recent Labs  Lab 08/23/17 2137  WBC 11.2*  HGB 8.4*  HCT 26.1*  PLT 360   ------------------------------------------------------------------------------------------------------------------  Chemistries  Recent Labs  Lab 08/23/17 2137  08/28/17 0349 08/29/17 0504  NA 133*   < > 134* 133*  K 4.0   < > 3.9 4.0  CL 100*   < > 104 101  CO2 23   < > 24 27  GLUCOSE 192*   < > 101* 89  BUN 17   < > 16 17  CREATININE 0.61   < > 0.55* 0.45*  CALCIUM 8.1*   < > 8.1* 8.3*  MG  --    < > 1.9  --   AST 32  --   --   --   ALT 15*  --   --   --   ALKPHOS 112  --   --   --   BILITOT 0.5  --   --   --    < > = values in this interval not displayed.   ------------------------------------------------------------------------------------------------------------------  Cardiac Enzymes Recent Labs  Lab 08/22/17 1240 08/23/17 2137  TROPONINI <0.03 <0.03   ------------------------------------------------------------------------------------------------------------------  RADIOLOGY:  Dg Chest Port 1 View  Result Date: 08/29/2017 CLINICAL DATA:  Pneumothorax EXAM: PORTABLE CHEST 1 VIEW COMPARISON:  08/28/2017 FINDINGS: Pigtail pleural catheter remains in the left lung apex unchanged. No pneumothorax. Small bilateral pleural effusions or pleural scarring unchanged. COPD with hyperinflation. Improvement in left lower lobe atelectasis. No edema. Port-A-Cath tip in the SVC IMPRESSION: Left chest tube remains in place.  No pneumothorax COPD with improvement in left lower lobe atelectasis. Electronically Signed   By: Juanda Crumble  Carlis Abbott M.D.   On: 08/29/2017 07:37   Dg Chest Port 1 View  Result Date: 08/28/2017 CLINICAL DATA:  Pneumothorax EXAM: PORTABLE CHEST 1 VIEW COMPARISON:  08/27/2017 FINDINGS: Pigtail pleural catheter overlies the left lung apex unchanged in position. Lack of lung markings in the left lateral apex suspicious for pneumothorax although  no definite pleural line identified. COPD with hyperinflation. Mild left lower lobe atelectasis unchanged. Small bilateral pleural effusions unchanged. Port-A-Cath tip SVC unchanged IMPRESSION: Left pleural catheter remains in place. Suspicion for left pneumothorax although no definite pleural line identified. Electronically Signed   By: Franchot Gallo M.D.   On: 08/28/2017 06:55     ASSESSMENT AND PLAN:   71 year old male who was discharged yesterday after pelvic fracture and discharged to skilled nursing facility who presents with shortness of breath and found to have pneumothorax.   1. Pneumothorax: Chest tube was discontinued however shortly after patient had another episode of shortness of breath and found to have recurrence of the pneumothorax. Chest tube management as per surgery Repeat CXR this a.m. does not show pneumothorax.  2. Hyponatremia due to dehydration Sodium level has improved.  3. Recent pelvic fracture: Continue pain management  Patient will go home with hospice. He does not want to go to skilled nursing facility  4. Protein calorie malnutrition: Continue protein supplements.   5. stage IV gastroesophageal junction carcinoma: Continue pain management  6. Acute grief reaction: Patient is emotional due to his wife passing away recently.  7. Shortness of breath and edema due to fluid overload (acute on chronic diastolic heart failure) as well as low albumin level and COPD exacerbation Echocardiogram from the 21st shows normal ejection fraction with moderate mitral regurgitation. Has diuresed well. Changed to oral steroids.  Management plans discussed with the patient and son and he is in agreement.  CODE STATUS: dnr  TOTAL TIME TAKING CARE OF THIS PATIENT: 23 minutes.     POSSIBLE D/C tomorrow with hospice at home DEPENDING ON pneumothorax/SOB  Larry Wood M.D on 08/29/2017 at 9:58 AM  Between 7am to 6pm - Pager - [phone] After 6pm go to  [email or web address] - password [email or web address] [facility]  Office  [phone]  CC: Primary care physician; Larry Late, MD  Note: This dictation was prepared with Dragon dictation along with smaller phrase technology. Any transcriptional errors that result from this process are unintentional.

## 2017-08-30 ENCOUNTER — Inpatient Hospital Stay: Payer: PPO

## 2017-08-30 LAB — MAGNESIUM: MAGNESIUM: 1.6 mg/dL — AB (ref 1.7–2.4)

## 2017-08-30 MED ORDER — MORPHINE SULFATE (CONCENTRATE) 10 MG/0.5ML PO SOLN: 20.0000 mg | ORAL | 0 refills | Status: DC | PRN

## 2017-08-30 MED ORDER — HEPARIN SOD (PORK) LOCK FLUSH 100 UNIT/ML IV SOLN
500.0000 [IU] | Freq: Once | INTRAVENOUS | Status: AC
  Administered 2017-08-30: 11:00:00 500 [IU] via INTRAVENOUS
  Filled 2017-08-30: qty 5

## 2017-08-30 MED ORDER — MAGNESIUM SULFATE 2 GM/50ML IV SOLN
2.0000 g | Freq: Once | INTRAVENOUS | Status: AC
  Administered 2017-08-30: 10:00:00 2 g via INTRAVENOUS
  Filled 2017-08-30: qty 50

## 2017-08-30 MED ORDER — DOCUSATE SODIUM 100 MG PO CAPS: 100.0000 mg | ORAL_CAPSULE | Freq: Two times a day (BID) | ORAL | 0 refills | Status: AC

## 2017-08-30 MED ORDER — PREDNISONE 50 MG PO TABS: ORAL_TABLET | ORAL | 0 refills | Status: AC

## 2017-08-30 NOTE — Progress Notes (Signed)
SURGICAL PROGRESS NOTE (cpt [phone])  Hospital Day(s): 6.   Post op day(s):  Marland Kitchen   Interval History: Patient seen and examined, no acute events or new complaints overnight. Patient reports his mild SOB over the past few days (attributed to hypervolemia and pulmonary edema with IVF) has nearly resolved as has the pain around Left chest tube insertion site following removal of his Left chest tube yesterday. He otherwise denies any CP, fever/chills, N/V, or abdominal pain.  Review of Systems:  Constitutional: denies fever, chills  HEENT: denies cough or congestion  Respiratory: shortness of breath as per interval history Cardiovascular: chest pain as per interval history, denies palpitations  Gastrointestinal: denies abdominal pain, N/V, or diarrhea Genitourinary: denies burning with urination or urinary frequency Musculoskeletal: denies pain, decreased motor or sensation Integumentary: denies any other rashes or skin discolorations except post-thoracostomy tube removal site Neurological: denies HA or vision/hearing changes   Vital signs in last 24 hours: [min-max] current  Temp:  [97.5 F (36.4 C)-98.1 F (36.7 C)] 98.1 F (36.7 C) (11/29 0441) Pulse Rate:  [90-107] 107 (11/29 0441) Resp:  [16-19] 19 (11/29 0441) BP: (121-128)/(53-68) 121/68 (11/29 0441) SpO2:  [95 %-97 %] 95 % (11/29 0441) Weight:  [139 lb 1.6 oz (63.1 kg)] 139 lb 1.6 oz (63.1 kg) (11/29 0445)     Height: 5\' 10"  (177.8 cm) Weight: 139 lb 1.6 oz (63.1 kg) BMI (Calculated): 19.96   Intake/Output this shift:  No intake/output data recorded.   Intake/Output last 2 shifts:  @IOLAST2SHIFTS @   Physical Exam:  Constitutional: alert, cooperative and no distress  HENT: normocephalic without obvious abnormality  Eyes: PERRL, EOM's grossly intact and symmetric  Neuro: CN II - XII grossly intact and symmetric without deficit  Respiratory: breathing non-labored at rest, Left chest dressing at former chest tube insertion  site c/d/i, non-tender to palpation Cardiovascular: regular rate and sinus rhythm  Gastrointestinal: soft, non-tender, and non-distended Musculoskeletal: UE and LE FROM, no edema or wounds, motor and sensation grossly intact, NT   Labs:  CBC Latest Ref Rng & Units 08/29/2017 08/23/2017 08/21/2017  WBC 3.8 - 10.6 K/uL 9.5 11.2(H) 9.0  Hemoglobin 13.0 - 18.0 g/dL 7.3(L) 8.4(L) 9.3(L)  Hematocrit 40.0 - 52.0 % 22.7(L) 26.1(L) 28.4(L)  Platelets 150 - 440 K/uL 297 360 262   CMP Latest Ref Rng & Units 08/29/2017 08/28/2017 08/26/2017  Glucose 65 - 99 mg/dL 89 101(H) 85  BUN 6 - 20 mg/dL 17 16 13   Creatinine 0.61 - 1.24 mg/dL 0.45(L) 0.55(L) 0.43(L)  Sodium 135 - 145 mmol/L 133(L) 134(L) 135  Potassium 3.5 - 5.1 mmol/L 4.0 3.9 3.6  Chloride 101 - 111 mmol/L 101 104 104  CO2 22 - 32 mmol/L 27 24 26   Calcium 8.9 - 10.3 mg/dL 8.3(L) 8.1(L) 7.9(L)  Total Protein 6.5 - 8.1 g/dL - - -  Total Bilirubin 0.3 - 1.2 mg/dL - - -  Alkaline Phos 38 - 126 U/L - - -  AST 15 - 41 U/L - - -  ALT 17 - 63 U/L - - -   Imaging studies:  Chest X-ray (08/30/2017) - personally reviewed, compared to prior studies, and discussed with patient and his family (bedside) Left chest tube removed.  No pneumothorax.  Assessment/Plan:(ICD-10's: J93.83) 71 y.o.malewith resolved recurrent Left-side pneumothorax s/p replacement and subsequent removal of pigtail thoracostomy chest tube, with second tube tunneled across at least one intercostal space,complicated by pertinent comorbidities including metastatic esophageal cancer for which patient has chosen home hospice as well  as by severe malnutrition (BMI: 19), COPD, and chronic anemia.  - pain control prn - keep air-tight dressing to chest tube removal site for at least 48 hours             - once chest tube site dressing removed, may shower with soap and water - medical management and discharge planning per primary team              - will sign off, please call if any questions or concerns  All of the above findings and recommendations were discussed with the patient and his family, and all of patient's and his family's questions were answered to their expressed satisfaction.  Thank you for the opportunity to participate in this patient's care.  -- Marilynne Drivers Rosana Hoes, MD, [place]: [facility] - Partnering for exceptional care. Office: [phone]

## 2017-08-30 NOTE — Discharge Summary (Signed)
[facility] NAME: Larry Wood    MR#:  [id number]  DATE OF BIRTH:  07-24-1946  DATE OF ADMISSION:  08/23/2017 ADMITTING PHYSICIAN: Harrie Foreman, MD  DATE OF DISCHARGE: 08/30/2017  PRIMARY CARE PHYSICIAN: Derinda Late, MD    ADMISSION DIAGNOSIS:  Sepsis, due to unspecified organism ([place]) [A41.9] Urinary tract infection without hematuria, site unspecified [N39.0] Pneumothorax, unspecified type [J93.9]  DISCHARGE DIAGNOSIS:  Active Problems:   Acute respiratory failure ([place])   Pneumothorax   SECONDARY DIAGNOSIS:   Past Medical History:  Diagnosis Date  . Anemia   . Cancer ([place])   . COPD (chronic obstructive pulmonary disease) [facility])     HOSPITAL COURSE:   71 year old male who was discharged recently after pelvic fracture and discharged to skilled nursing facility who presents with shortness of breath and found to have pneumothorax.   1. Pneumothorax: Chest tube was discontinued however shortly after patient had another episode of shortness of breath and found to have recurrence of the pneumothorax. istory stable and was removed on 1128. He has no sign  Shortness of breath or pneumothorax on repeat chest x-ray.  2. Hyponatremia due to dehydration Sodium level has improved.  3. Recent pelvic fracture: Continue pain management  Patient will go home with hospice.  He does not want to go to skilled nursing facility  4. Protein calorie malnutrition: Continue protein supplements.   5. stage IV gastroesophageal junction carcinoma: Continue pain managementand home with hospice  6. Acute grief reaction: Patient is emotional due to his wife passing away recently.  7. Shortness of breath and edema due to fluid overload (acute on chronic diastolic heart failure) as well as low albumin level and COPD exacerbation Echocardiogram from the 21st shows normal ejection fraction with moderate mitral  regurgitation. E has diuresed well. He still has some edema which is likely due to his low albumin and nutritional status.  He will be discharged with oral steroids.     DISCHARGE CONDITIONS AND DIET:   Stable for discharge on regular diet  CONSULTS OBTAINED:  Treatment Team:  Jules Husbands, MD  DRUG ALLERGIES:  No Known Allergies  DISCHARGE MEDICATIONS:   Allergies as of 08/30/2017   No Known Allergies     Medication List    STOP taking these medications   HYDROcodone-acetaminophen 5-325 MG tablet Commonly known as:  NORCO/VICODIN     TAKE these medications   acetaminophen 325 MG tablet Commonly known as:  TYLENOL Take 2 tablets (650 mg total) by mouth every 6 (six) hours as needed for mild pain (or Fever >/= 101).   ALPRAZolam 0.25 MG tablet Commonly known as:  XANAX Take 1 tablet (0.25 mg total) by mouth 3 (three) times daily as needed for anxiety.   docusate sodium 100 MG capsule Commonly known as:  COLACE Take 1 capsule (100 mg total) by mouth 2 (two) times daily.   morphine 30 MG 12 hr tablet Commonly known as:  MS CONTIN Take 1 tablet (30 mg total) by mouth every 12 (twelve) hours. What changed:  Another medication with the same name was added. Make sure you understand how and when to take each.   morphine CONCENTRATE 10 MG/0.5ML Soln concentrated solution Take 1 mL (20 mg total) by mouth every 4 (four) hours as needed for moderate pain or severe pain. What changed:  You were already taking a medication with the same name, and this prescription was added. Make sure you  understand how and when to take each.   pantoprazole 40 MG tablet Commonly known as:  PROTONIX Take 1 tablet (40 mg total) by mouth 2 (two) times daily.   polyethylene glycol packet Commonly known as:  MIRALAX / GLYCOLAX Take 17 g by mouth daily as needed for mild constipation.   predniSONE 50 MG tablet Commonly known as:  DELTASONE Take 1 tablet daily for 2 days Start taking on:   08/31/2017         Today   CHIEF COMPLAINT:   No shortness of breath. Patient is ready to go home. He will watch funeral of his wife this afternoon and then go home. Family is at bedside.   VITAL SIGNS:  Blood pressure 121/68, pulse (!) 107, temperature 98.1 F (36.7 C), temperature source Oral, resp. rate 19, height 5\' 10"  (1.778 m), weight 63.1 kg (139 lb 1.6 oz), SpO2 95 %.   REVIEW OF SYSTEMS:  Review of Systems  Constitutional: Positive for malaise/fatigue and weight loss. Negative for chills and fever.  HENT: Negative.  Negative for ear discharge, ear pain, hearing loss, nosebleeds and sore throat.   Eyes: Negative.  Negative for blurred vision and pain.  Respiratory: Negative.  Negative for cough, hemoptysis, shortness of breath and wheezing.   Cardiovascular: Negative.  Negative for chest pain, palpitations and leg swelling.  Gastrointestinal: Negative.  Negative for abdominal pain, blood in stool, diarrhea, nausea and vomiting.  Genitourinary: Negative.  Negative for dysuria.  Musculoskeletal: Negative.  Negative for back pain.  Skin: Negative.   Neurological: Positive for weakness. Negative for dizziness, tremors, speech change, focal weakness, seizures and headaches.  Endo/Heme/Allergies: Negative.  Does not bruise/bleed easily.  Psychiatric/Behavioral: Negative.  Negative for depression, hallucinations and suicidal ideas.     PHYSICAL EXAMINATION:  GENERAL:  71 y.o.-year-old patient lying in the bed with no acute distress. Frail, cachectic chronically ill-appearing NECK:  Supple, no jugular venous distention. No thyroid enlargement, no tenderness.  LUNGS: Normal breath sounds bilaterally, no wheezing, rales,rhonchi  No use of accessory muscles of respiration.  CARDIOVASCULAR: S1, S2 normal. No murmurs, rubs, or gallops.  ABDOMEN: Soft, non-tender, non-distended. Bowel sounds present. No organomegaly or mass.  EXTREMITIES: No pedal edema, cyanosis, or clubbing.   PSYCHIATRIC: The patient is alert and oriented x 3.  SKIN: No obvious rash, lesion, or ulcer.   DATA REVIEW:   CBC Recent Labs  Lab 08/29/17 1224  WBC 9.5  HGB 7.3*  HCT 22.7*  PLT 297    Chemistries  Recent Labs  Lab 08/23/17 2137  08/29/17 0504 08/30/17 0455  NA 133*   < > 133*  --   K 4.0   < > 4.0  --   CL 100*   < > 101  --   CO2 23   < > 27  --   GLUCOSE 192*   < > 89  --   BUN 17   < > 17  --   CREATININE 0.61   < > 0.45*  --   CALCIUM 8.1*   < > 8.3*  --   MG  --    < >  --  1.6*  AST 32  --   --   --   ALT 15*  --   --   --   ALKPHOS 112  --   --   --   BILITOT 0.5  --   --   --    < > = values in this  interval not displayed.    Cardiac Enzymes Recent Labs  Lab 08/23/17 2137  TROPONINI <0.03    Microbiology Results  @[email or web address] @  RADIOLOGY:  Dg Chest Port 1 View  Result Date: 08/30/2017 CLINICAL DATA:  Pneumothorax followup EXAM: PORTABLE CHEST 1 VIEW COMPARISON:  08/29/2017 FINDINGS: Pigtail pleural catheter on the left has been removed. Negative for pneumothorax. Small bilateral pleural effusions are pleural scarring unchanged. Mild bibasilar airspace disease unchanged. Underlying COPD. Port-A-Cath tip in the SVC. IMPRESSION: Left chest tube removed.  No pneumothorax. Electronically Signed   By: Franchot Gallo M.D.   On: 08/30/2017 07:47   Dg Chest Port 1 View  Result Date: 08/29/2017 CLINICAL DATA:  Pneumothorax EXAM: PORTABLE CHEST 1 VIEW COMPARISON:  08/28/2017 FINDINGS: Pigtail pleural catheter remains in the left lung apex unchanged. No pneumothorax. Small bilateral pleural effusions or pleural scarring unchanged. COPD with hyperinflation. Improvement in left lower lobe atelectasis. No edema. Port-A-Cath tip in the SVC IMPRESSION: Left chest tube remains in place.  No pneumothorax COPD with improvement in left lower lobe atelectasis. Electronically Signed   By: Franchot Gallo M.D.   On: 08/29/2017 07:37      Allergies as of 08/30/2017    No Known Allergies     Medication List    STOP taking these medications   HYDROcodone-acetaminophen 5-325 MG tablet Commonly known as:  NORCO/VICODIN     TAKE these medications   acetaminophen 325 MG tablet Commonly known as:  TYLENOL Take 2 tablets (650 mg total) by mouth every 6 (six) hours as needed for mild pain (or Fever >/= 101).   ALPRAZolam 0.25 MG tablet Commonly known as:  XANAX Take 1 tablet (0.25 mg total) by mouth 3 (three) times daily as needed for anxiety.   docusate sodium 100 MG capsule Commonly known as:  COLACE Take 1 capsule (100 mg total) by mouth 2 (two) times daily.   morphine 30 MG 12 hr tablet Commonly known as:  MS CONTIN Take 1 tablet (30 mg total) by mouth every 12 (twelve) hours. What changed:  Another medication with the same name was added. Make sure you understand how and when to take each.   morphine CONCENTRATE 10 MG/0.5ML Soln concentrated solution Take 1 mL (20 mg total) by mouth every 4 (four) hours as needed for moderate pain or severe pain. What changed:  You were already taking a medication with the same name, and this prescription was added. Make sure you understand how and when to take each.   pantoprazole 40 MG tablet Commonly known as:  PROTONIX Take 1 tablet (40 mg total) by mouth 2 (two) times daily.   polyethylene glycol packet Commonly known as:  MIRALAX / GLYCOLAX Take 17 g by mouth daily as needed for mild constipation.   predniSONE 50 MG tablet Commonly known as:  DELTASONE Take 1 tablet daily for 2 days Start taking on:  08/31/2017          Management plans discussed with the patient and he is in agreement. Stable for discharge home with hospice  Patient should follow up with hospice  CODE STATUS:     Code Status Orders  (From admission, onward)        Start     Ordered   08/24/17 0308  Do not attempt resuscitation (DNR)  Continuous    Question Answer Comment  In the event of cardiac or respiratory  ARREST Do not call a "code blue"   In the event of cardiac or  respiratory ARREST Do not perform Intubation, CPR, defibrillation or ACLS   In the event of cardiac or respiratory ARREST Use medication by any route, position, wound care, and other measures to relive pain and suffering. May use oxygen, suction and manual treatment of airway obstruction as needed for comfort.      08/24/17 0307    Code Status History    Date Active Date Inactive Code Status Order ID Comments User Context   08/21/2017 20:26 08/23/2017 20:53 DNR [id number]  Gorden Harms, MD Inpatient   07/03/2017 11:27 08/17/2017 08:33 DNR [id number]  Sindy Guadeloupe, MD Outpatient   03/09/2017 15:02 03/11/2017 18:41 Full Code [id number]  Nicholes Mango, MD Inpatient    Advance Directive Documentation     Most Recent Value  Type of Advance Directive  Out of facility DNR (pink MOST or yellow form)  Pre-existing out of facility DNR order (yellow form or pink MOST form)  No data  "MOST" Form in Place?  No data      TOTAL TIME TAKING CARE OF THIS PATIENT: 38 minutes.    Note: This dictation was prepared with Dragon dictation along with smaller phrase technology. Any transcriptional errors that result from this process are unintentional.  Lubna Stegeman M.D on 08/30/2017 at 10:44 AM  Between 7am to 6pm - Pager - [phone] After 6pm go to [email or web address] - password [email or web address] [email or web address]  Office  [phone]  CC: Primary care physician; Derinda Late, MD

## 2017-08-30 NOTE — Progress Notes (Signed)
Follow up visit made. Larry Wood is preparing to watch his wife's funeral via computer with his brother Rush Landmark, sister in law Marcie Bal and family friend Tretha Sciara. They have asked that a do not disturb sign be placed on the door. Plan is for discharge home via EMS later this afternoon. Family will notify staff RN when ready for EMS to be notified. Discharge summary faxed to referral.  Flo Shanks RN, BSN, [facility] Liaison [phone] c

## 2017-08-30 NOTE — Care Management (Addendum)
Discharge to home today per Dr. Benjie Karvonen. Will be going to daughter's home. Will be followed by [facility]. Transportation will be arranged per [place] RN MSN CCM Care Management [phone]

## 2017-08-30 NOTE — Progress Notes (Signed)
MEDICATION RELATED CONSULT NOTE - INITIAL   Pharmacy Consult for Electrolyte management Indication: Hypomagnesemia   No Known Allergies  Labs: Recent Labs    08/28/17 0349 08/29/17 0504 08/29/17 1224 08/30/17 0455  WBC  --   --  9.5  --   HGB  --   --  7.3*  --   HCT  --   --  22.7*  --   PLT  --   --  297  --   CREATININE 0.55* 0.45*  --   --   MG 1.9  --   --  1.6*  ALBUMIN 1.8*  --   --   --    This SmartLink has not been configured with any valid records.   Lab Results  Component Value Date   K 4.0 08/29/2017   Estimated Creatinine Clearance: 77.8 mL/min (A) (by C-G formula based on SCr of 0.45 mg/dL (L)).  Medical History: Past Medical History:  Diagnosis Date  . Anemia   . Cancer ([place])   . COPD (chronic obstructive pulmonary disease) ([facility])     Assessment: 71 yo male with pneumothorax and chest tube. Patient discontinued from [facility] 11/22 s/p pelvic fx and d/c to SNF but returned a few hours later  Goal of Therapy:  Electrolytes WNL  Plan:  Magnesium level subtherapeutic at 1.6 this morning. Will replace with Mg sulfate 2g IV x 1 dose.    Will F/U with Mg level in AM  Pernell Dupre, PharmD, BCPS Clinical Pharmacist 08/30/2017 7:24 AM

## 2017-08-31 ENCOUNTER — Inpatient Hospital Stay: Payer: PPO

## 2017-08-31 ENCOUNTER — Inpatient Hospital Stay: Payer: PPO | Admitting: Oncology

## 2017-09-03 ENCOUNTER — Ambulatory Visit: Payer: PPO | Attending: Radiation Oncology | Admitting: Radiation Oncology

## 2017-09-11 ENCOUNTER — Telehealth: Payer: Self-pay | Admitting: *Deleted

## 2017-09-11 MED ORDER — MORPHINE SULFATE (CONCENTRATE) 10 MG/0.5ML PO SOLN: 20.0000 mg | ORAL | 0 refills | Status: DC | PRN

## 2017-09-11 NOTE — Telephone Encounter (Signed)
Yes ok to increase MS ER to 30 mg BID

## 2017-09-11 NOTE — Addendum Note (Signed)
Addended by: Betti Cruz on: 09/11/2017 03:40 PM   Modules accepted: Orders

## 2017-09-11 NOTE — Telephone Encounter (Signed)
Hospice nurse called and reports that patient is having increased pain and is asking if we can increase his MS ER dose 30 mg twice a day or if you want to order something different. He is using a lot of Roxanol right now. Please advise

## 2017-09-11 NOTE — Telephone Encounter (Signed)
Per Dr Janese Banks increase to 60 mg twice a day. Vivien Rota informed, left message on voice mail

## 2017-09-18 ENCOUNTER — Other Ambulatory Visit: Payer: Self-pay | Admitting: *Deleted

## 2017-09-18 MED ORDER — MORPHINE SULFATE (CONCENTRATE) 10 MG/0.5ML PO SOLN: 20.0000 mg | ORAL | 0 refills | Status: DC | PRN

## 2017-09-18 MED ORDER — FUROSEMIDE 20 MG PO TABS: 20.0000 mg | ORAL_TABLET | Freq: Every day | ORAL | 3 refills | Status: AC

## 2017-09-18 MED ORDER — MORPHINE SULFATE ER 60 MG PO TBCR: 60.0000 mg | EXTENDED_RELEASE_TABLET | Freq: Two times a day (BID) | ORAL | 0 refills | Status: DC

## 2017-09-18 NOTE — Addendum Note (Signed)
Addended by: Betti Cruz on: 09/18/2017 02:41 PM   Modules accepted: Orders

## 2017-09-18 NOTE — Telephone Encounter (Signed)
Requesting refill on MS ER 60 mg and Roxanol. Also requesting to refill Lasix which was discontinued last time he was in hospital. He has scrotal and leg and arm edema. Which is weeping  Please advise

## 2017-09-27 ENCOUNTER — Other Ambulatory Visit: Payer: Self-pay | Admitting: *Deleted

## 2017-09-27 MED ORDER — MORPHINE SULFATE (CONCENTRATE) 10 MG/0.5ML PO SOLN: 20.0000 mg | ORAL | 0 refills | Status: DC | PRN

## 2017-09-27 MED ORDER — MORPHINE SULFATE ER 30 MG PO TBCR: 30.0000 mg | EXTENDED_RELEASE_TABLET | Freq: Three times a day (TID) | ORAL | 0 refills | Status: DC

## 2017-09-27 NOTE — Addendum Note (Signed)
Addended by: Betti Cruz on: 09/27/2017 03:45 PM   Modules accepted: Orders

## 2017-09-27 NOTE — Telephone Encounter (Signed)
Hospice nurse called and reports that the increase in MS ER to 60 mg houis causing dizziness. Patient requesting to change to MS ER 30 mg three times a day stating that worked, just did not last 12 hours. Please advise and if in agreement, he will need new prescription for MS ER 30 mg sent to pharmacy

## 2017-09-27 NOTE — Telephone Encounter (Signed)
Ok to do 30 mg ER TID

## 2017-10-04 ENCOUNTER — Other Ambulatory Visit: Payer: Self-pay | Admitting: *Deleted

## 2017-10-04 MED ORDER — MORPHINE SULFATE (CONCENTRATE) 10 MG/0.5ML PO SOLN: 20.0000 mg | ORAL | 0 refills | Status: DC | PRN

## 2017-10-04 NOTE — Telephone Encounter (Signed)
Right arm swollen left is swollen and weeping from elbows to fingers bilateral,  bilateral legs 2+ - 3+ PE Scrotal swelling is reduced. He is on Lasix 20 mg daily. Please advise

## 2017-10-04 NOTE — Telephone Encounter (Signed)
Of note, patient last Albumin check in November was low at 2.2. Do you want to change his lasix to Bumex?

## 2017-10-04 NOTE — Telephone Encounter (Signed)
Left message on Toni's voice mail per VO Dr Janese Banks, that diuretics usually does not help upper extremities. But if feel it is necessary, can try bumex

## 2017-10-09 ENCOUNTER — Other Ambulatory Visit: Payer: Self-pay | Admitting: *Deleted

## 2017-10-09 MED ORDER — MORPHINE SULFATE (CONCENTRATE) 10 MG/0.5ML PO SOLN: 20.0000 mg | ORAL | 0 refills | Status: DC | PRN

## 2017-10-09 MED ORDER — MORPHINE SULFATE ER 30 MG PO TBCR: 30.0000 mg | EXTENDED_RELEASE_TABLET | Freq: Three times a day (TID) | ORAL | 0 refills | Status: AC

## 2017-10-17 ENCOUNTER — Other Ambulatory Visit: Payer: Self-pay | Admitting: *Deleted

## 2017-10-17 MED ORDER — MORPHINE SULFATE (CONCENTRATE) 20 MG/ML PO SOLN: ORAL | 0 refills | Status: AC

## 2017-10-17 NOTE — Telephone Encounter (Signed)
Done- E-scribed to [facility]

## 2017-11-02 DEATH — deceased

## 2018-01-18 IMAGING — CR DG HIP (WITH OR WITHOUT PELVIS) 2-3V*R*
3 series · 3 of 3 positions shown · non-contrast
Comparison: CT 06/29/2017.

CLINICAL DATA: Fall.  Groin pain.  Back pain.

EXAM:
DG HIP (WITH OR WITHOUT PELVIS) 2-3V RIGHT

[pelvis ap]
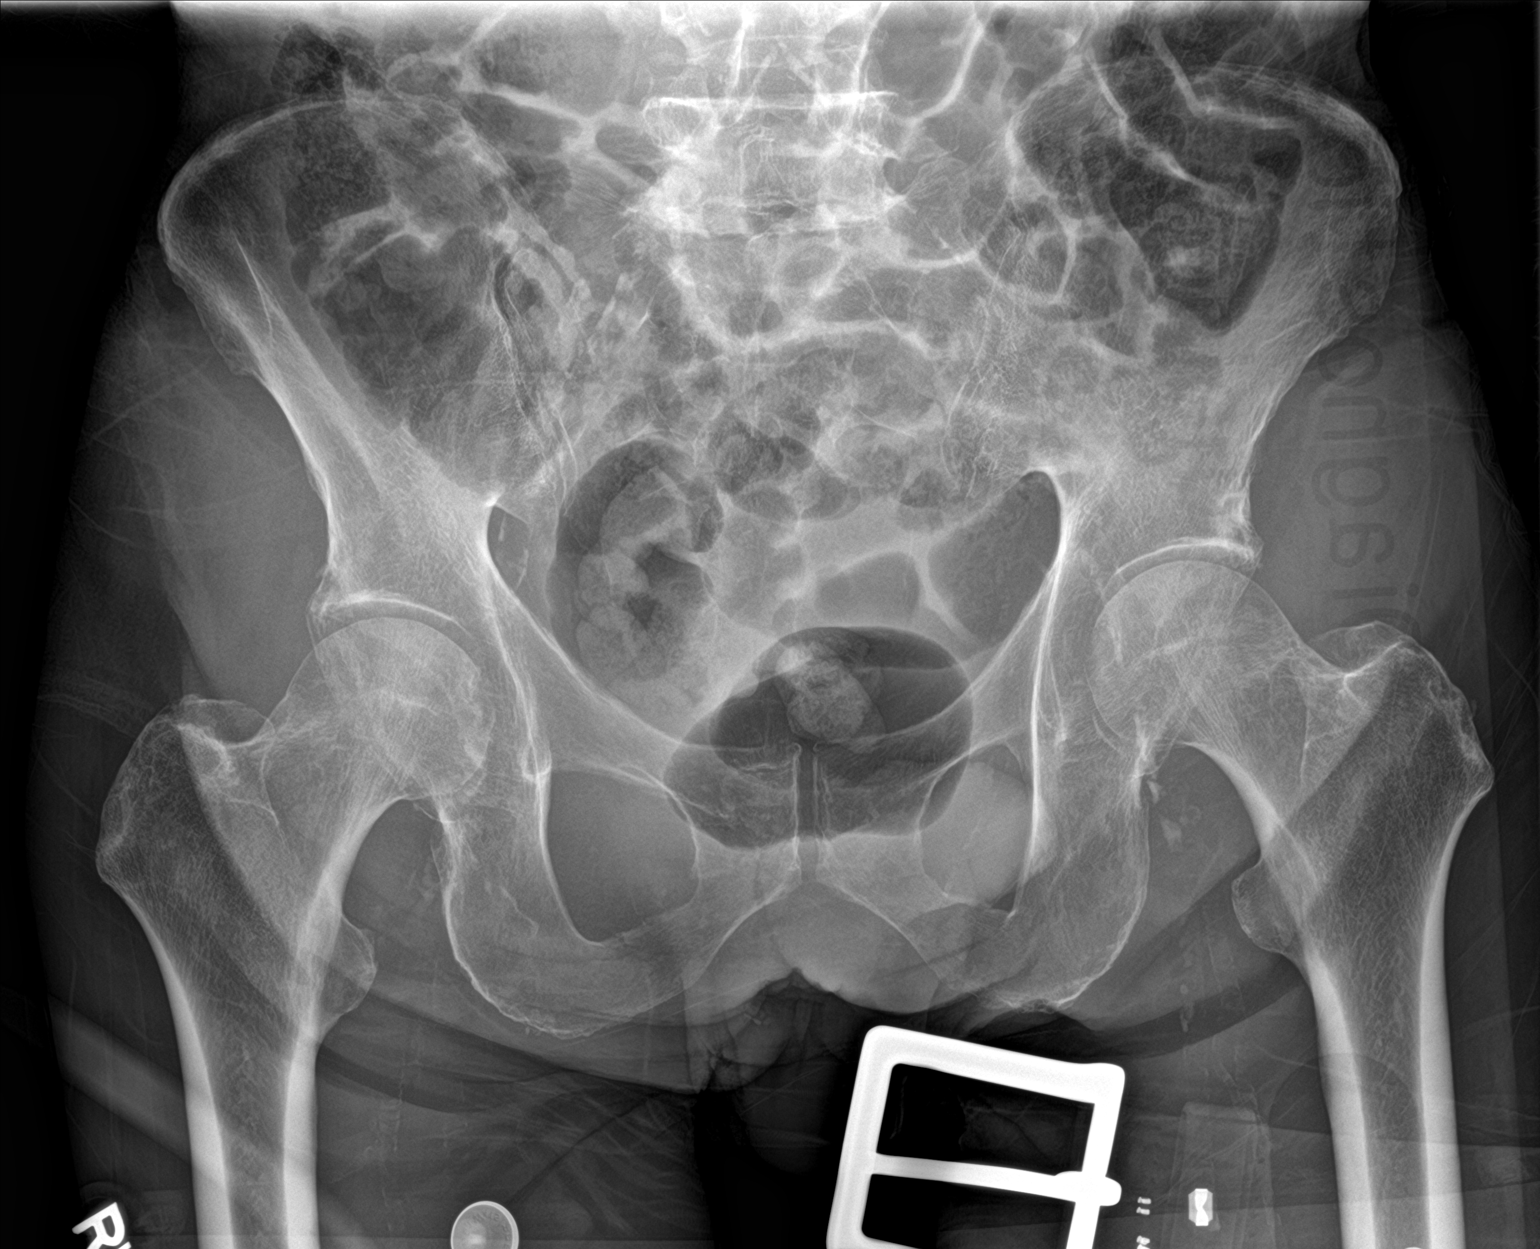

[hip ap]
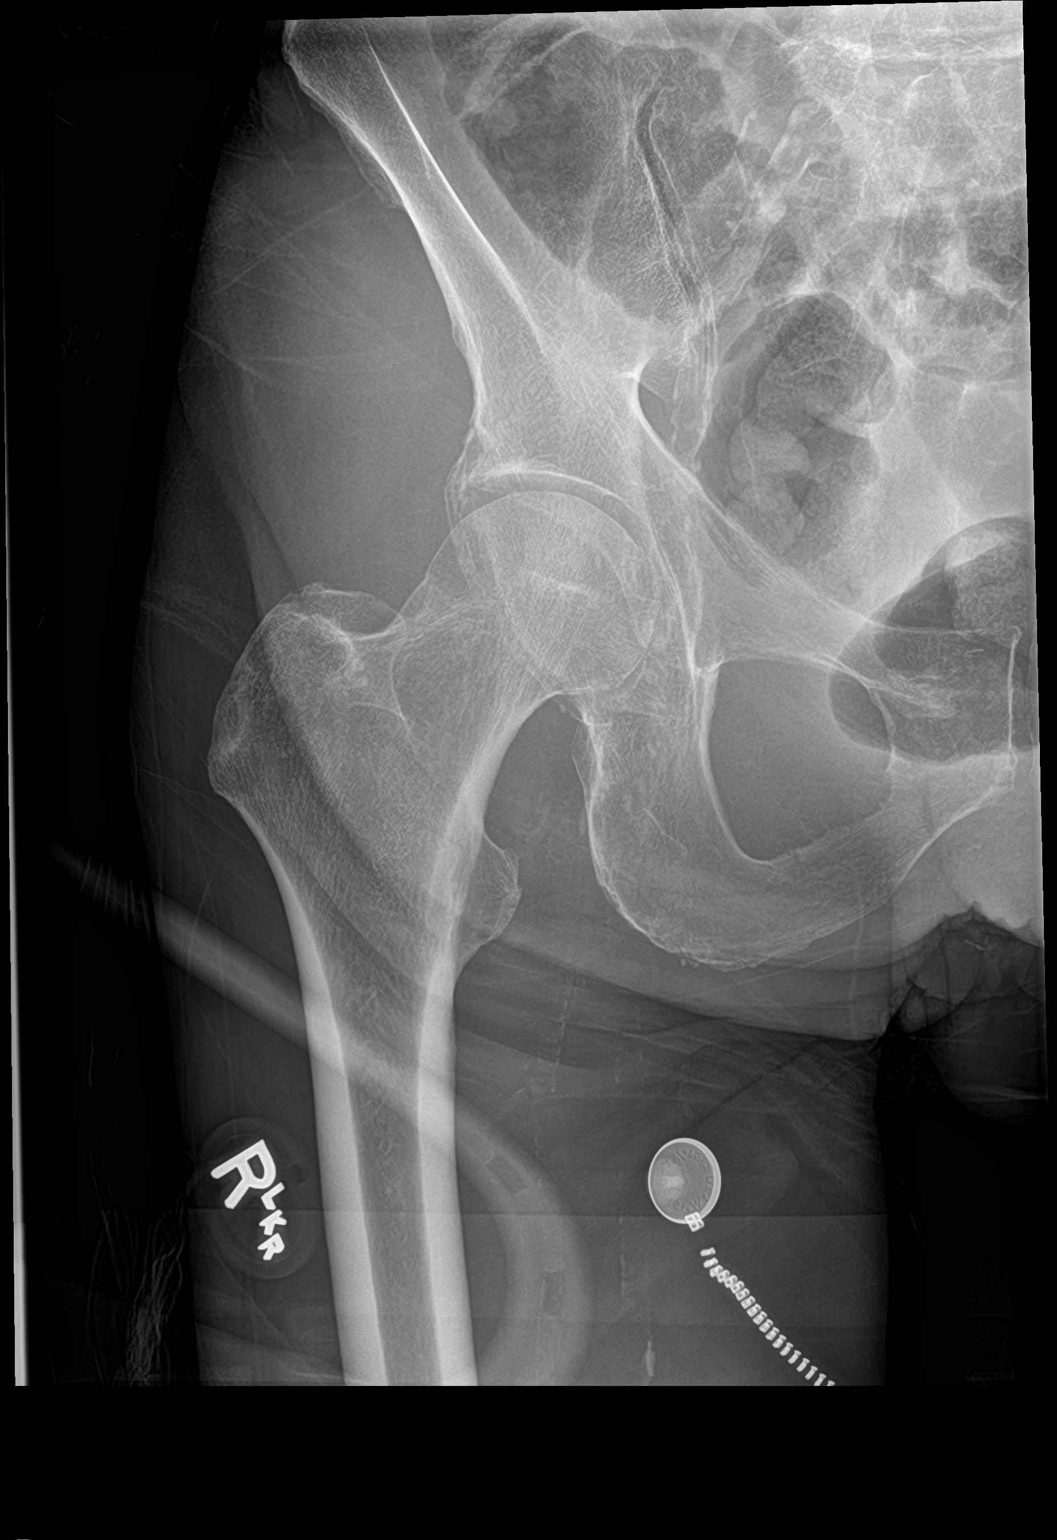

[hip lat]
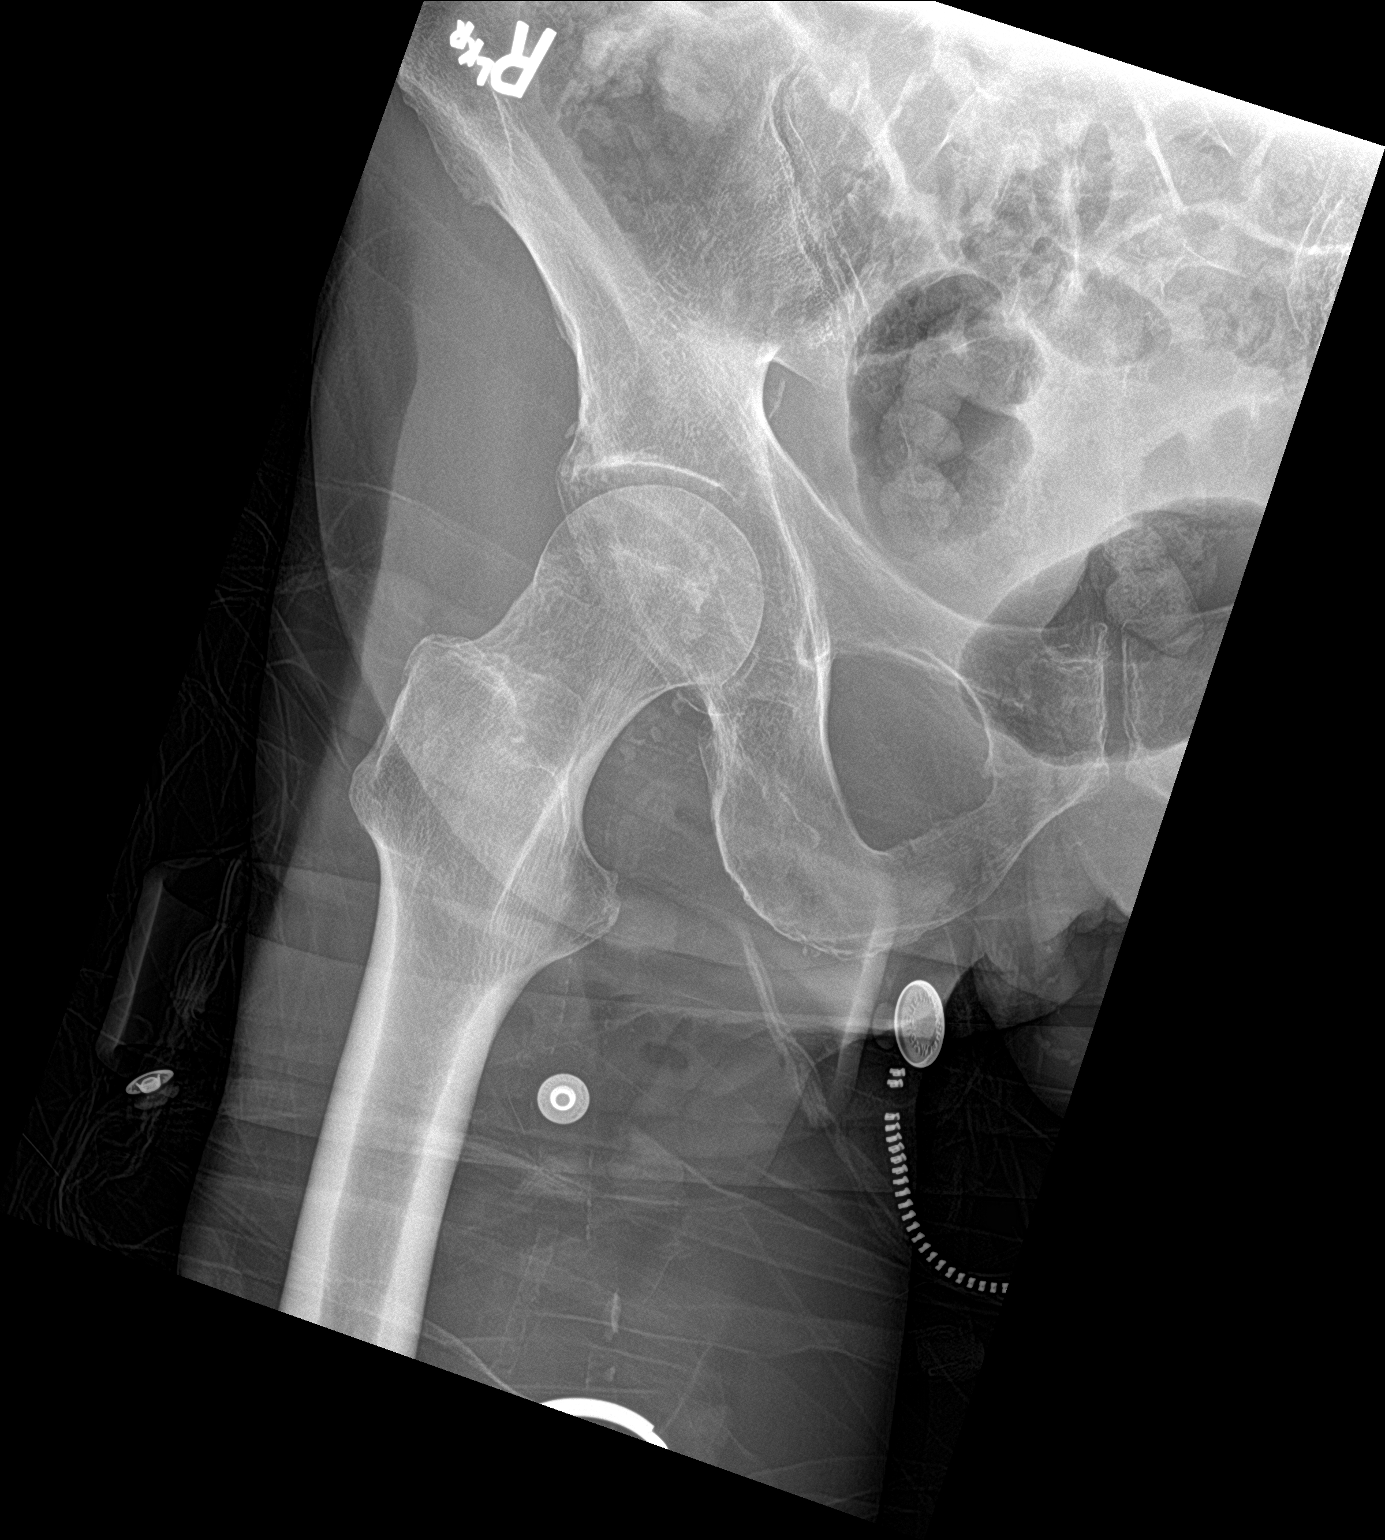

[3 of 3 positions shown; findings below may reference images not displayed]

FINDINGS: Diffuse osteopenia and degenerative change. No acute bony or joint
abnormality identified. No evidence fracture or dislocation.
Peripheral vascular calcification.
IMPRESSION: 1.  Diffuse osteopenia degenerative change.  No acute abnormality.

2. Peripheral vascular disease .

## 2018-01-20 IMAGING — DX DG CHEST 1V PORT
1 series · 2 of 2 positions shown · non-contrast
Comparison: Chest CT 06/26/2017, CXR 06/26/2017.

CLINICAL DATA: Hypoxia on 15 L of non repair either. Left arm
swelling. History of cancer.

EXAM:
PORTABLE CHEST 1 VIEW

[Series 1: chest ap · 0.14mm/px · 2 of 2 slices shown]
[im 1/2]
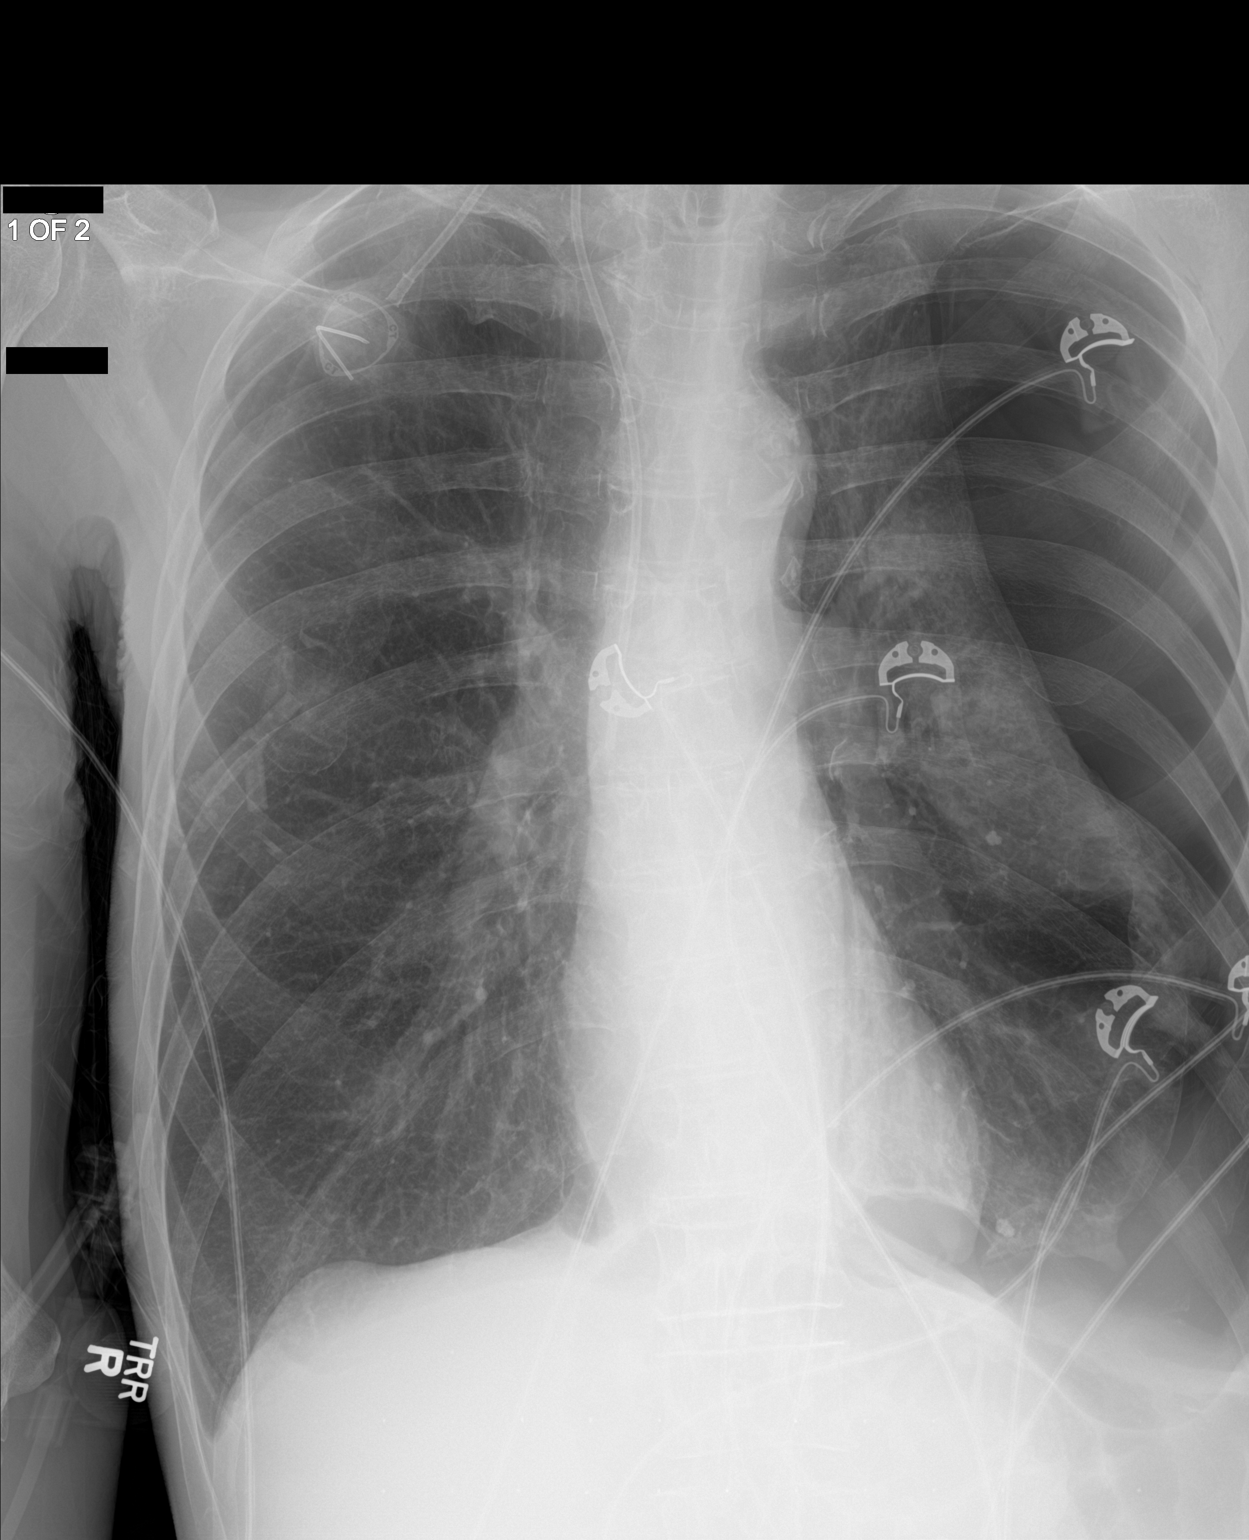
[im 2/2]
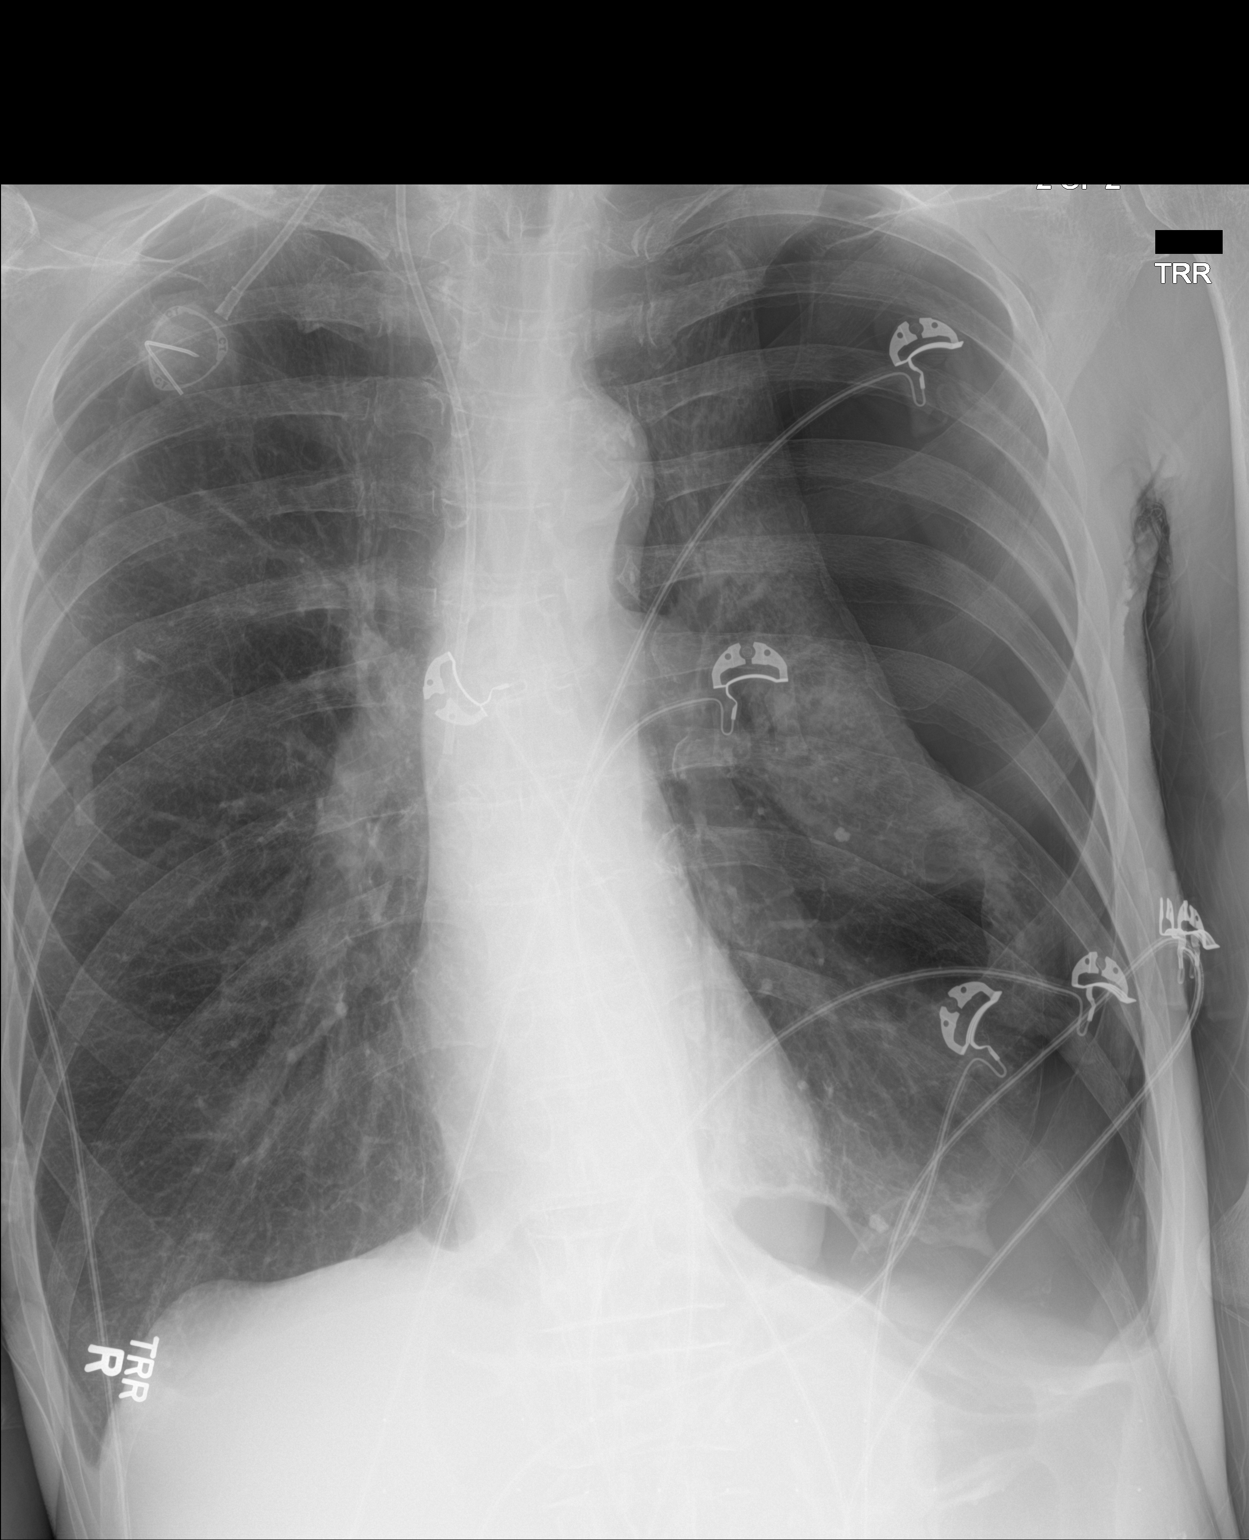

[2 of 2 positions shown; findings below may reference images not displayed]

FINDINGS: New large left-sided pneumothorax of roughly 50% of the left lung
without mediastinal shift. Hyperinflated right lung with port
catheter tip in the distal SVC from right-sided approach. Aortic
atherosclerosis is noted. Heart size is normal. No acute osseous
abnormality.
IMPRESSION: 1. Large at least 50% pneumothorax on the left without mediastinal
shift or tension. Critical Value/emergent results were called by
telephone at the time of interpretation on 08/23/2017 at [DATE] to
Dr. IMANTA IDR , who verbally acknowledged these results.
2. Pulmonary hyperinflation of the right lung with port catheter in
place.
3. Aortic atherosclerosis.

## 2018-01-21 IMAGING — DX DG CHEST 1V
1 series · 2 of 2 positions shown · non-contrast
Comparison: 08/23/2017.

CLINICAL DATA: Shortness of breath.

EXAM:
CHEST 1 VIEW

[Series 1: chest ap · 0.14mm/px · 2 of 2 slices shown]
[im 1/2]
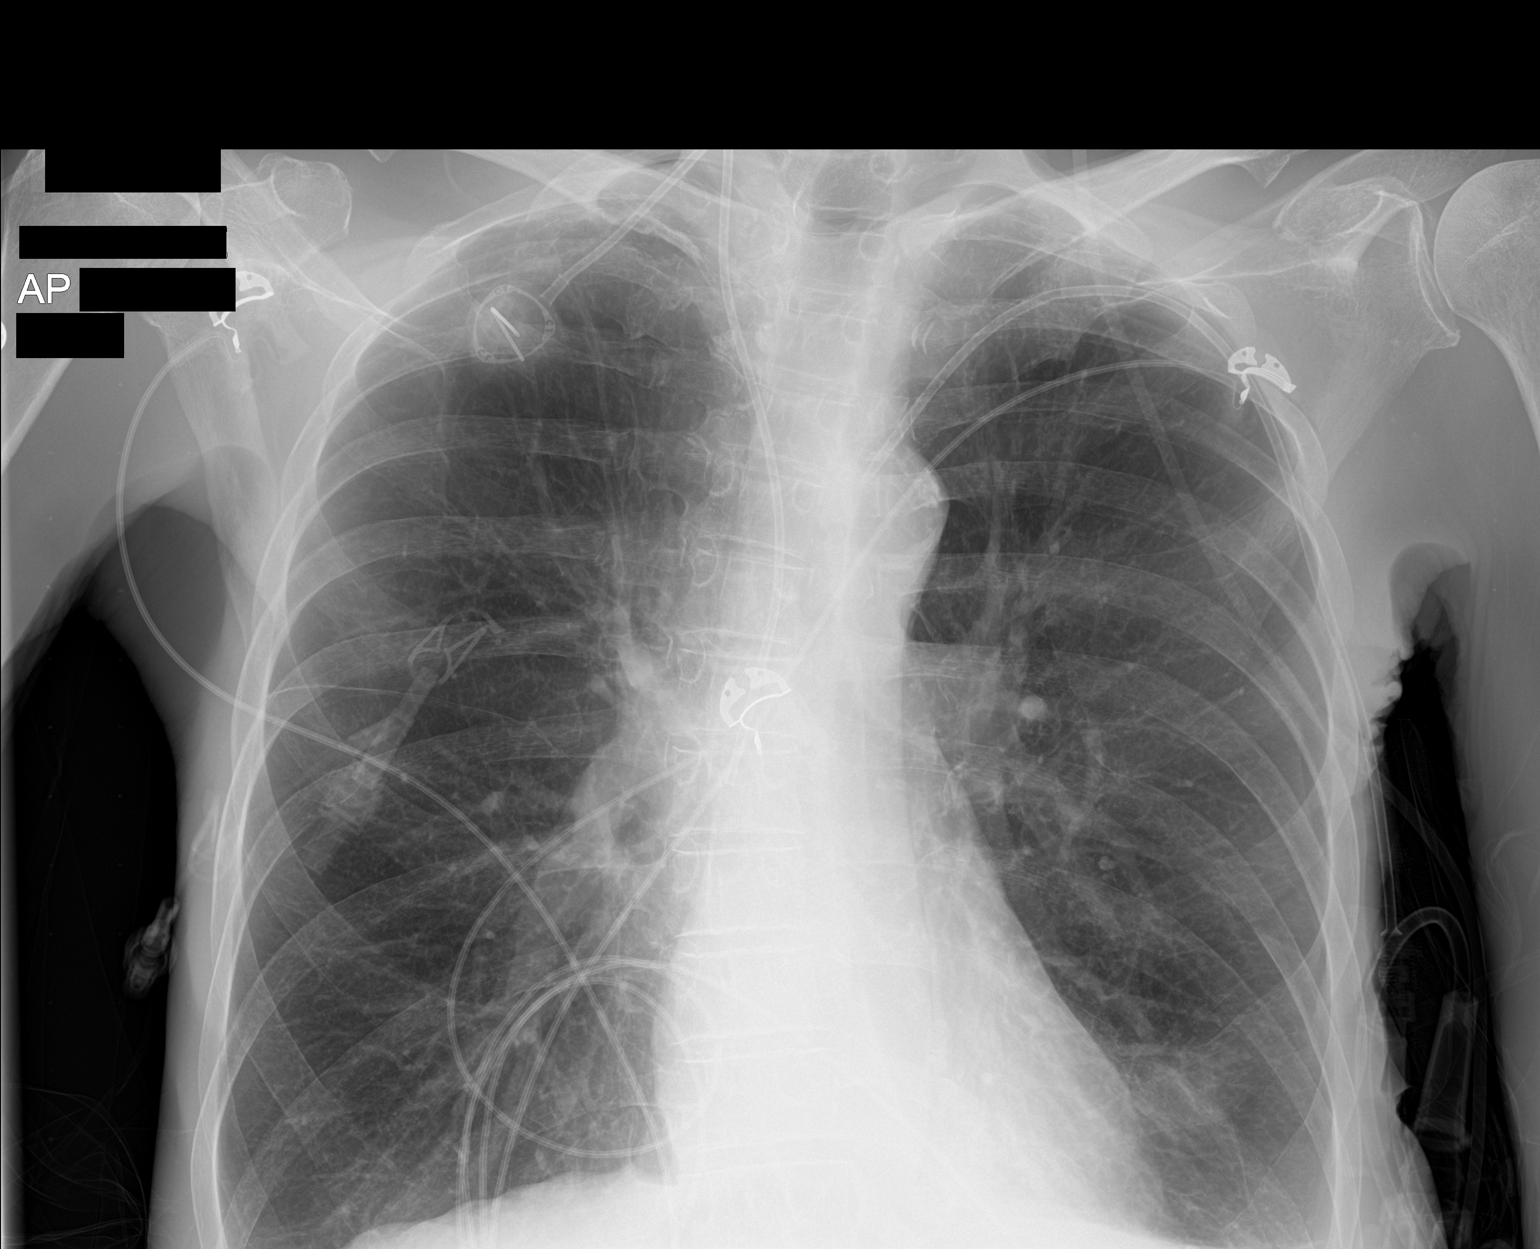
[im 2/2]
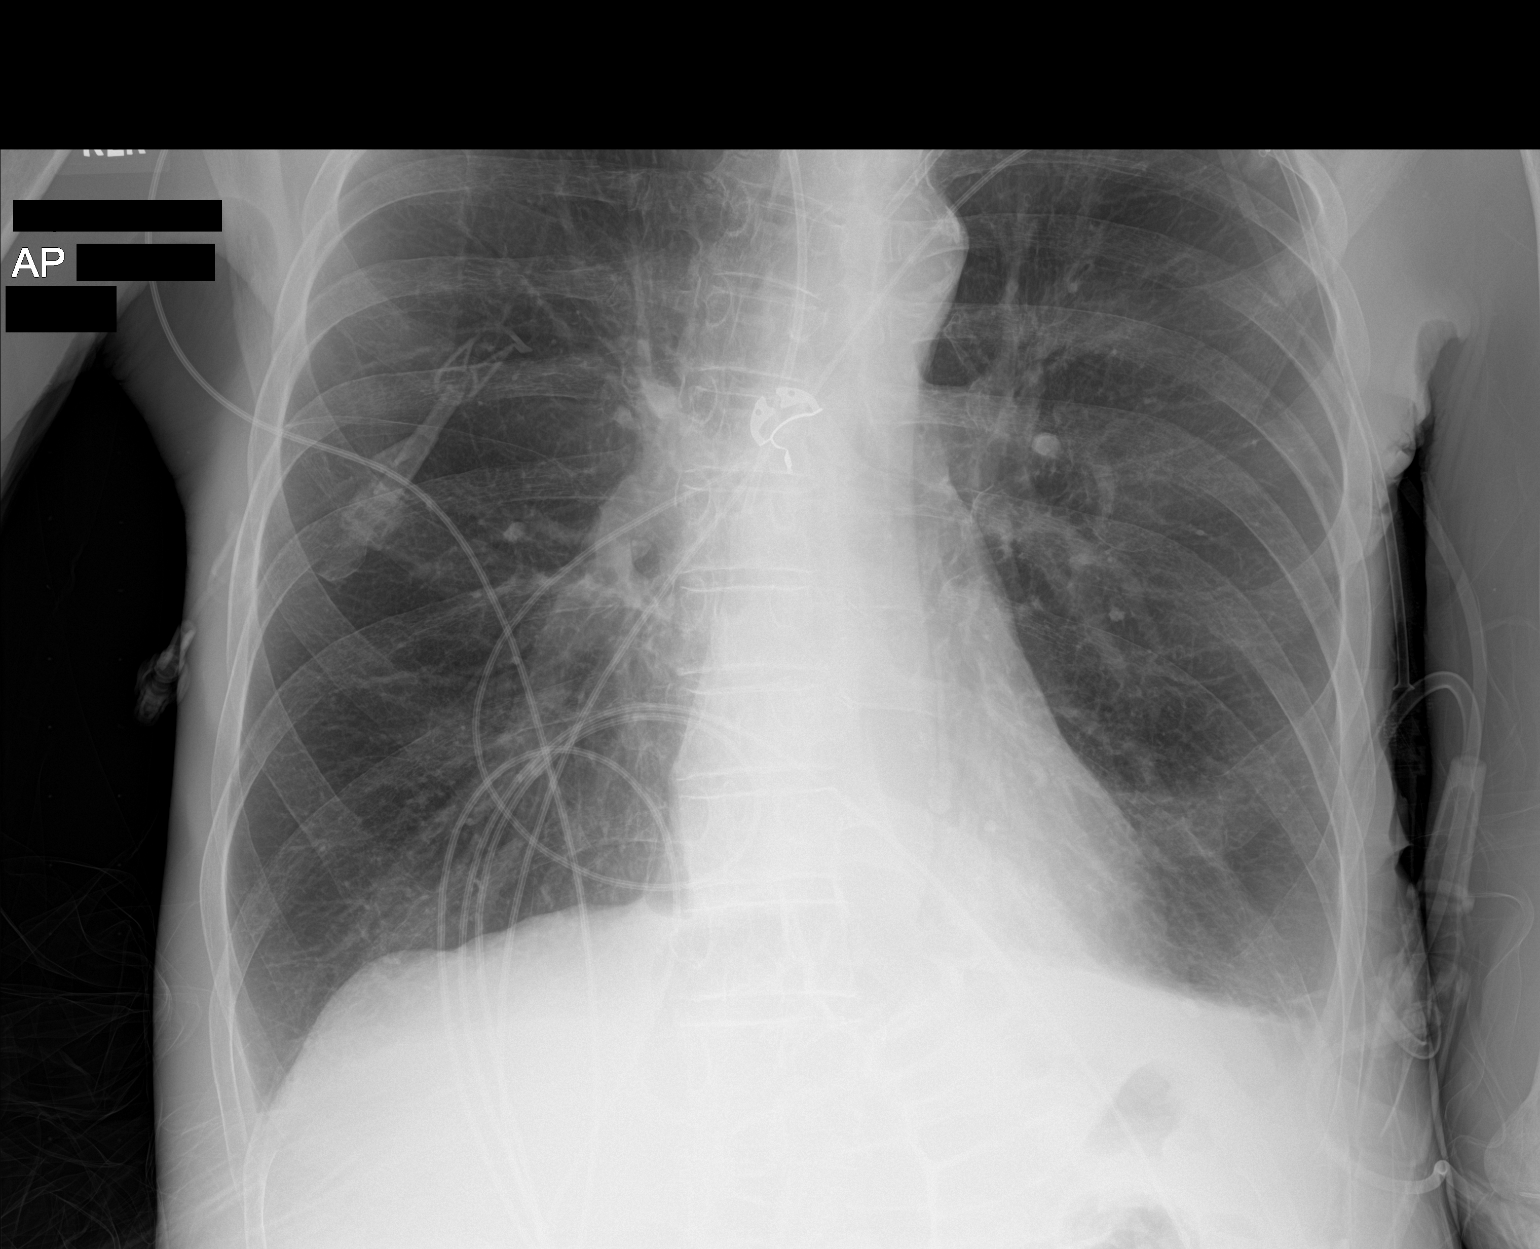

[2 of 2 positions shown; findings below may reference images not displayed]

FINDINGS: PowerPort catheter with tip over the superior vena cava. Heart size
normal. No pulmonary venous congestion. COPD. No focal infiltrate.
Small bilateral pleural effusions. No pneumothorax .
IMPRESSION: 1. PowerPort catheter noted with tip projected over superior vena
cava.

2. COPD. Mild basilar atelectasis and small bilateral pleural
effusions.
